# Patient Record
Sex: Male | Born: 1960 | Race: White | Hispanic: No | Marital: Married | Smoking: Former smoker
Health system: Southern US, Community
[De-identification: ages and names within clinical notes are randomized; demographics above are authoritative.]

## PROBLEM LIST (undated history)

## (undated) VITALS — BP 118/76 | HR 89 | Temp 98.5°F | Resp 20 | Ht 74.5 in | Wt 288.0 lb

## (undated) DIAGNOSIS — D696 Thrombocytopenia, unspecified: Secondary | ICD-10-CM

## (undated) DIAGNOSIS — I10 Essential (primary) hypertension: Secondary | ICD-10-CM

## (undated) DIAGNOSIS — K859 Acute pancreatitis without necrosis or infection, unspecified: Secondary | ICD-10-CM

## (undated) DIAGNOSIS — F329 Major depressive disorder, single episode, unspecified: Secondary | ICD-10-CM

## (undated) DIAGNOSIS — B1920 Unspecified viral hepatitis C without hepatic coma: Secondary | ICD-10-CM

## (undated) DIAGNOSIS — K7460 Unspecified cirrhosis of liver: Secondary | ICD-10-CM

## (undated) DIAGNOSIS — Z5189 Encounter for other specified aftercare: Secondary | ICD-10-CM

## (undated) DIAGNOSIS — E669 Obesity, unspecified: Secondary | ICD-10-CM

## (undated) DIAGNOSIS — F319 Bipolar disorder, unspecified: Secondary | ICD-10-CM

## (undated) DIAGNOSIS — IMO0001 Reserved for inherently not codable concepts without codable children: Secondary | ICD-10-CM

## (undated) DIAGNOSIS — F419 Anxiety disorder, unspecified: Secondary | ICD-10-CM

## (undated) DIAGNOSIS — D649 Anemia, unspecified: Secondary | ICD-10-CM

## (undated) HISTORY — PX: TONSILLECTOMY: SUR1361

---

## 2009-02-15 ENCOUNTER — Ambulatory Visit: Payer: Self-pay | Admitting: Diagnostic Radiology

## 2009-02-15 ENCOUNTER — Emergency Department (HOSPITAL_BASED_OUTPATIENT_CLINIC_OR_DEPARTMENT_OTHER): Admission: EM | Admit: 2009-02-15 | Discharge: 2009-02-16 | Payer: Self-pay | Admitting: Emergency Medicine

## 2009-02-16 ENCOUNTER — Ambulatory Visit: Payer: Self-pay | Admitting: Diagnostic Radiology

## 2010-01-20 ENCOUNTER — Inpatient Hospital Stay (HOSPITAL_COMMUNITY): Admission: EM | Admit: 2010-01-20 | Discharge: 2010-01-24 | Payer: Self-pay | Admitting: Emergency Medicine

## 2010-02-07 DIAGNOSIS — I8500 Esophageal varices without bleeding: Secondary | ICD-10-CM | POA: Insufficient documentation

## 2010-02-07 DIAGNOSIS — K8020 Calculus of gallbladder without cholecystitis without obstruction: Secondary | ICD-10-CM | POA: Insufficient documentation

## 2010-02-07 DIAGNOSIS — F1021 Alcohol dependence, in remission: Secondary | ICD-10-CM

## 2010-02-07 DIAGNOSIS — K7460 Unspecified cirrhosis of liver: Secondary | ICD-10-CM | POA: Insufficient documentation

## 2010-02-07 DIAGNOSIS — D62 Acute posthemorrhagic anemia: Secondary | ICD-10-CM | POA: Insufficient documentation

## 2010-02-07 DIAGNOSIS — D689 Coagulation defect, unspecified: Secondary | ICD-10-CM

## 2010-02-07 DIAGNOSIS — B1710 Acute hepatitis C without hepatic coma: Secondary | ICD-10-CM | POA: Insufficient documentation

## 2010-02-07 DIAGNOSIS — D696 Thrombocytopenia, unspecified: Secondary | ICD-10-CM | POA: Insufficient documentation

## 2010-02-07 DIAGNOSIS — M549 Dorsalgia, unspecified: Secondary | ICD-10-CM | POA: Insufficient documentation

## 2010-07-31 LAB — CBC
HCT: 24.5 % — ABNORMAL LOW (ref 39.0–52.0)
HCT: 28.5 % — ABNORMAL LOW (ref 39.0–52.0)
Hemoglobin: 7.4 g/dL — ABNORMAL LOW (ref 13.0–17.0)
Hemoglobin: 8.3 g/dL — ABNORMAL LOW (ref 13.0–17.0)
Hemoglobin: 9.5 g/dL — ABNORMAL LOW (ref 13.0–17.0)
MCH: 31.3 pg (ref 26.0–34.0)
MCHC: 33.4 g/dL (ref 30.0–36.0)
MCHC: 33.6 g/dL (ref 30.0–36.0)
MCHC: 34.2 g/dL (ref 30.0–36.0)
MCHC: 34.2 g/dL (ref 30.0–36.0)
MCHC: 34.3 g/dL (ref 30.0–36.0)
MCV: 90.4 fL (ref 78.0–100.0)
MCV: 91.9 fL (ref 78.0–100.0)
MCV: 92.3 fL (ref 78.0–100.0)
Platelets: 72 10*3/uL — ABNORMAL LOW (ref 150–400)
Platelets: 74 10*3/uL — ABNORMAL LOW (ref 150–400)
RBC: 2.55 MIL/uL — ABNORMAL LOW (ref 4.22–5.81)
RBC: 2.64 MIL/uL — ABNORMAL LOW (ref 4.22–5.81)
RBC: 2.69 MIL/uL — ABNORMAL LOW (ref 4.22–5.81)
RBC: 3.07 MIL/uL — ABNORMAL LOW (ref 4.22–5.81)
RDW: 16.8 % — ABNORMAL HIGH (ref 11.5–15.5)
RDW: 17.7 % — ABNORMAL HIGH (ref 11.5–15.5)
RDW: 17.9 % — ABNORMAL HIGH (ref 11.5–15.5)
RDW: 18.6 % — ABNORMAL HIGH (ref 11.5–15.5)
WBC: 2.7 10*3/uL — ABNORMAL LOW (ref 4.0–10.5)
WBC: 4.1 10*3/uL (ref 4.0–10.5)
WBC: 6.2 10*3/uL (ref 4.0–10.5)

## 2010-07-31 LAB — POTASSIUM: Potassium: 3.9 mEq/L (ref 3.5–5.1)

## 2010-07-31 LAB — COMPREHENSIVE METABOLIC PANEL
ALT: 29 U/L (ref 0–53)
AST: 53 U/L — ABNORMAL HIGH (ref 0–37)
AST: 82 U/L — ABNORMAL HIGH (ref 0–37)
Alkaline Phosphatase: 54 U/L (ref 39–117)
BUN: 28 mg/dL — ABNORMAL HIGH (ref 6–23)
BUN: 8 mg/dL (ref 6–23)
CO2: 20 mEq/L (ref 19–32)
CO2: 21 mEq/L (ref 19–32)
CO2: 23 mEq/L (ref 19–32)
Calcium: 7.2 mg/dL — ABNORMAL LOW (ref 8.4–10.5)
Calcium: 7.6 mg/dL — ABNORMAL LOW (ref 8.4–10.5)
Calcium: 7.7 mg/dL — ABNORMAL LOW (ref 8.4–10.5)
Chloride: 114 mEq/L — ABNORMAL HIGH (ref 96–112)
Chloride: 117 mEq/L — ABNORMAL HIGH (ref 96–112)
Creatinine, Ser: 0.67 mg/dL (ref 0.4–1.5)
Creatinine, Ser: 0.8 mg/dL (ref 0.4–1.5)
GFR calc Af Amer: >60 mL/min (ref >60)
GFR calc Af Amer: >60 mL/min (ref >60)
GFR calc Af Amer: >60 mL/min (ref >60)
GFR calc non Af Amer: >60 mL/min (ref >60)
GFR calc non Af Amer: >60 mL/min (ref >60)
Glucose, Bld: 118 mg/dL — ABNORMAL HIGH (ref 70–99)
Glucose, Bld: 136 mg/dL — ABNORMAL HIGH (ref 70–99)
Glucose, Bld: 97 mg/dL (ref 70–99)
Sodium: 137 mEq/L (ref 135–145)
Sodium: 140 mEq/L (ref 135–145)
Sodium: 144 mEq/L (ref 135–145)
Total Bilirubin: 1.7 mg/dL — ABNORMAL HIGH (ref 0.3–1.2)
Total Protein: 4.8 g/dL — ABNORMAL LOW (ref 6.0–8.3)
Total Protein: 5.2 g/dL — ABNORMAL LOW (ref 6.0–8.3)

## 2010-07-31 LAB — TYPE AND SCREEN: ABO/RH(D): O POS

## 2010-07-31 LAB — URINALYSIS, ROUTINE W REFLEX MICROSCOPIC
Glucose, UA: NEGATIVE mg/dL
Specific Gravity, Urine: 1.025 (ref 1.005–1.030)
Urobilinogen, UA: 1 mg/dL (ref 0.0–1.0)
pH: 7 (ref 5.0–8.0)

## 2010-07-31 LAB — PHOSPHORUS: Phosphorus: 3.4 mg/dL (ref 2.3–4.6)

## 2010-07-31 LAB — BASIC METABOLIC PANEL
Chloride: 117 mEq/L — ABNORMAL HIGH (ref 96–112)
GFR calc Af Amer: >60 mL/min (ref >60)
Potassium: 3.6 mEq/L (ref 3.5–5.1)

## 2010-07-31 LAB — DIFFERENTIAL
Basophils Relative: 3 % — ABNORMAL HIGH (ref 0–1)
Eosinophils Absolute: 0 10*3/uL (ref 0.0–0.7)
Eosinophils Relative: 0 % (ref 0–5)
Lymphs Abs: 0.6 10*3/uL — ABNORMAL LOW (ref 0.7–4.0)
Monocytes Relative: 9 % (ref 3–12)

## 2010-07-31 LAB — PREPARE RBC (CROSSMATCH)

## 2010-07-31 LAB — CARDIAC PANEL(CRET KIN+CKTOT+MB+TROPI)
CK, MB: 2.5 ng/mL (ref 0.3–4.0)
Relative Index: 1 (ref 0.0–2.5)
Total CK: 245 U/L — ABNORMAL HIGH (ref 7–232)
Total CK: 253 U/L — ABNORMAL HIGH (ref 7–232)
Troponin I: 0.01 ng/mL (ref 0.00–0.06)
Troponin I: 0.01 ng/mL (ref 0.00–0.06)

## 2010-07-31 LAB — HEPATITIS PANEL, ACUTE
HCV Ab: REACTIVE — AB
Hep A IgM: NEGATIVE
Hepatitis B Surface Ag: NEGATIVE

## 2010-07-31 LAB — CULTURE, BLOOD (SINGLE)
Culture: NO GROWTH
Culture: NO GROWTH

## 2010-07-31 LAB — HEMOGLOBIN AND HEMATOCRIT, BLOOD
HCT: 27.9 % — ABNORMAL LOW (ref 39.0–52.0)
HCT: 28.5 % — ABNORMAL LOW (ref 39.0–52.0)
Hemoglobin: 9.7 g/dL — ABNORMAL LOW (ref 13.0–17.0)

## 2010-07-31 LAB — PROTIME-INR
INR: 1.5 — ABNORMAL HIGH (ref 0.00–1.49)
Prothrombin Time: 18.3 seconds — ABNORMAL HIGH (ref 11.6–15.2)

## 2010-07-31 LAB — MAGNESIUM: Magnesium: 2 mg/dL (ref 1.5–2.5)

## 2010-07-31 LAB — ABO/RH: ABO/RH(D): O POS

## 2010-07-31 LAB — HEPATITIS C VRS RNA DETECT BY PCR-QUAL: Hepatitis C Vrs RNA by PCR-Qual: POSITIVE — AB

## 2010-07-31 LAB — HEMOCCULT GUIAC POC 1CARD (OFFICE): Fecal Occult Bld: POSITIVE

## 2010-07-31 LAB — APTT: aPTT: 26 seconds (ref 24–37)

## 2010-08-21 LAB — POCT TOXICOLOGY PANEL

## 2010-08-21 LAB — DIFFERENTIAL
Basophils Absolute: 0 10*3/uL (ref 0.0–0.1)
Basophils Relative: 1 % (ref 0–1)
Eosinophils Absolute: 0 10*3/uL (ref 0.0–0.7)
Eosinophils Relative: 0 % (ref 0–5)
Lymphs Abs: 1 10*3/uL (ref 0.7–4.0)
Neutrophils Relative %: 52 % (ref 43–77)

## 2010-08-21 LAB — LIPASE, BLOOD: Lipase: 171 U/L (ref 23–300)

## 2010-08-21 LAB — URINALYSIS, ROUTINE W REFLEX MICROSCOPIC
Leukocytes, UA: NEGATIVE
Protein, ur: NEGATIVE mg/dL
Urobilinogen, UA: 0.2 mg/dL (ref 0.0–1.0)

## 2010-08-21 LAB — COMPREHENSIVE METABOLIC PANEL
ALT: 101 U/L — ABNORMAL HIGH (ref 0–53)
CO2: 30 mEq/L (ref 19–32)
Calcium: 8.9 mg/dL (ref 8.4–10.5)
Chloride: 111 mEq/L (ref 96–112)
GFR calc non Af Amer: >60 mL/min (ref >60)
Glucose, Bld: 128 mg/dL — ABNORMAL HIGH (ref 70–99)
Sodium: 151 mEq/L — ABNORMAL HIGH (ref 135–145)
Total Bilirubin: 1.5 mg/dL — ABNORMAL HIGH (ref 0.3–1.2)

## 2010-08-21 LAB — CBC
Hemoglobin: 15.2 g/dL (ref 13.0–17.0)
MCHC: 34.1 g/dL (ref 30.0–36.0)
MCV: 95.6 fL (ref 78.0–100.0)
RBC: 4.68 MIL/uL (ref 4.22–5.81)
WBC: 2.9 10*3/uL — ABNORMAL LOW (ref 4.0–10.5)

## 2010-08-21 LAB — URINE MICROSCOPIC-ADD ON

## 2010-08-21 LAB — SALICYLATE LEVEL: Salicylate Lvl: <1 mg/dL — ABNORMAL LOW (ref 2.8–20.0)

## 2011-06-24 ENCOUNTER — Telehealth: Payer: Self-pay | Admitting: Family Medicine

## 2011-06-24 ENCOUNTER — Ambulatory Visit (INDEPENDENT_AMBULATORY_CARE_PROVIDER_SITE_OTHER): Payer: Self-pay | Admitting: Family Medicine

## 2011-06-24 VITALS — BP 138/85 | HR 101 | Temp 99.0°F | Resp 18 | Ht 76.25 in | Wt 287.0 lb

## 2011-06-24 DIAGNOSIS — Z765 Malingerer [conscious simulation]: Secondary | ICD-10-CM

## 2011-06-24 DIAGNOSIS — F1910 Other psychoactive substance abuse, uncomplicated: Secondary | ICD-10-CM

## 2011-06-24 NOTE — Progress Notes (Signed)
  Subjective:    Patient ID: [name] [name], male    DOB: 02-Jun-1960, 51 y.o.   MRN: [id number]  HPI 51 yo male with h/o hep C cirrhosis and alcohol abuse as well as anxiety who would like to resume anxiety meds.  Currently unemployed.  Was taking xanax 1 mg TID prn.  Tried valium.  Made too sleepy.  Never on a SSRI.   Recently having increase in panic attacks.  Left [employer] with cart full due to overwhelming anxiety.  Helps care for mother-in-law.    Wants back on xanax.  Checked DEA website - given #90 of xanax 1mg  by another local doctor and filled on 12/28 and 1/28.  Asked patient about this.  He confirmed it was true.  Confirmed he still had some at home.  Told patient he cannot have more.  Pt okay.  Review of Systems     Objective:   Physical Exam   Deferred - patient no longer wanted visit when clear no xanax would be prescribed     Assessment & Plan:  Drug seeking behavior

## 2011-06-29 NOTE — Telephone Encounter (Signed)
done

## 2011-07-08 ENCOUNTER — Telehealth: Payer: Self-pay

## 2011-10-30 ENCOUNTER — Emergency Department (HOSPITAL_BASED_OUTPATIENT_CLINIC_OR_DEPARTMENT_OTHER)
Admission: EM | Admit: 2011-10-30 | Discharge: 2011-10-30 | Disposition: A | Discharge status: Home / Self Care | Payer: Self-pay | Attending: Emergency Medicine | Admitting: Emergency Medicine

## 2011-10-30 ENCOUNTER — Encounter (HOSPITAL_BASED_OUTPATIENT_CLINIC_OR_DEPARTMENT_OTHER): Payer: Self-pay | Admitting: *Deleted

## 2011-10-30 DIAGNOSIS — F411 Generalized anxiety disorder: Secondary | ICD-10-CM | POA: Insufficient documentation

## 2011-10-30 DIAGNOSIS — K922 Gastrointestinal hemorrhage, unspecified: Secondary | ICD-10-CM | POA: Insufficient documentation

## 2011-10-30 DIAGNOSIS — I10 Essential (primary) hypertension: Secondary | ICD-10-CM | POA: Insufficient documentation

## 2011-10-30 DIAGNOSIS — B182 Chronic viral hepatitis C: Secondary | ICD-10-CM | POA: Insufficient documentation

## 2011-10-30 DIAGNOSIS — K7460 Unspecified cirrhosis of liver: Secondary | ICD-10-CM | POA: Insufficient documentation

## 2011-10-30 DIAGNOSIS — Z87891 Personal history of nicotine dependence: Secondary | ICD-10-CM | POA: Insufficient documentation

## 2011-10-30 DIAGNOSIS — Z79899 Other long term (current) drug therapy: Secondary | ICD-10-CM | POA: Insufficient documentation

## 2011-10-30 DIAGNOSIS — E860 Dehydration: Secondary | ICD-10-CM | POA: Insufficient documentation

## 2011-10-30 DIAGNOSIS — D649 Anemia, unspecified: Secondary | ICD-10-CM | POA: Insufficient documentation

## 2011-10-30 HISTORY — DX: Essential (primary) hypertension: I10

## 2011-10-30 HISTORY — DX: Unspecified viral hepatitis C without hepatic coma: B19.20

## 2011-10-30 HISTORY — DX: Thrombocytopenia, unspecified: D69.6

## 2011-10-30 HISTORY — DX: Encounter for other specified aftercare: Z51.89

## 2011-10-30 HISTORY — DX: Anemia, unspecified: D64.9

## 2011-10-30 HISTORY — DX: Reserved for inherently not codable concepts without codable children: IMO0001

## 2011-10-30 HISTORY — DX: Anxiety disorder, unspecified: F41.9

## 2011-10-30 HISTORY — DX: Unspecified cirrhosis of liver: K74.60

## 2011-10-30 LAB — PROTIME-INR
INR: 1.29 (ref 0.00–1.49)
Prothrombin Time: 16.3 seconds — ABNORMAL HIGH (ref 11.6–15.2)

## 2011-10-30 LAB — COMPREHENSIVE METABOLIC PANEL
ALT: 53 U/L (ref 0–53)
BUN: 29 mg/dL — ABNORMAL HIGH (ref 6–23)
CO2: 22 mEq/L (ref 19–32)
Calcium: 9.7 mg/dL (ref 8.4–10.5)
Creatinine, Ser: 0.5 mg/dL (ref 0.50–1.35)
GFR calc Af Amer: >90 mL/min (ref >90)
GFR calc non Af Amer: >90 mL/min (ref >90)
Glucose, Bld: 145 mg/dL — ABNORMAL HIGH (ref 70–99)
Sodium: 136 mEq/L (ref 135–145)
Total Protein: 7.4 g/dL (ref 6.0–8.3)

## 2011-10-30 LAB — CBC
HCT: 39.5 % (ref 39.0–52.0)
MCH: 32 pg (ref 26.0–34.0)
MCHC: 36.9 g/dL — ABNORMAL HIGH (ref 30.0–36.0)
MCV: 86.1 fL (ref 78.0–100.0)
Platelets: 139 10*3/uL — ABNORMAL LOW (ref 150–400)
Platelets: 114 10*3/uL — ABNORMAL LOW (ref 150–400)
RBC: 4.59 MIL/uL (ref 4.22–5.81)
RDW: 13.9 % (ref 11.5–15.5)
WBC: 7.7 10*3/uL (ref 4.0–10.5)

## 2011-10-30 LAB — DIFFERENTIAL
Eosinophils Absolute: 0.2 10*3/uL (ref 0.0–0.7)
Eosinophils Relative: 2 % (ref 0–5)
Lymphs Abs: 1.4 10*3/uL (ref 0.7–4.0)
Monocytes Absolute: 0.8 10*3/uL (ref 0.1–1.0)

## 2011-10-30 LAB — OCCULT BLOOD X 1 CARD TO LAB, STOOL: Fecal Occult Bld: POSITIVE

## 2011-10-30 MED ORDER — SODIUM CHLORIDE 0.9 % IV BOLUS (SEPSIS)
1000.0000 mL | Freq: Once | INTRAVENOUS | Status: AC
  Administered 2011-10-30: 1000 mL via INTRAVENOUS

## 2011-10-30 MED ORDER — SODIUM CHLORIDE 0.9 % IV SOLN
80.0000 mg | Freq: Once | INTRAVENOUS | Status: AC
  Administered 2011-10-30: 80 mg via INTRAVENOUS
  Filled 2011-10-30: qty 80

## 2011-10-30 MED ORDER — ALPRAZOLAM 0.5 MG PO TABS
1.0000 mg | ORAL_TABLET | Freq: Once | ORAL | Status: AC
  Administered 2011-10-30: 0.5 mg via ORAL
  Filled 2011-10-30: qty 1

## 2011-10-30 MED ORDER — SODIUM CHLORIDE 0.9 % IV SOLN
8.0000 mg/h | INTRAVENOUS | Status: DC
  Filled 2011-10-30: qty 80

## 2011-10-30 NOTE — Discharge Instructions (Signed)
Gastrointestinal Bleeding Gastrointestinal (GI) bleeding is bleeding from the gut or any place between your mouth and anus. If bleeding is slow, you may be allowed to go home. If there is a lot of bleeding, hospitalization and observation are often required. SYMPTOMS   You vomit bright red blood or material that looks like coffee grounds.   You have blood in your stools or the stools look black and tarry.  DIAGNOSIS  Your caregiver may diagnose your condition by taking a history and a physical exam. More tests may be needed, including:  X-rays.   EGD (esophagogastroduodenoscopy), which looks at your esophagus, stomach, and small bowel through a flexible telescope-like instrument.   Colonoscopy, which looks at your colon/large bowel through a flexible telescope-like instrument.   Biopsies, which remove a small sample of tissue to examine under a microscope.  Finding out the results of your test Not all test results are available during your visit. If your test results are not back during the visit, make an appointment with your caregiver to find out the results. Do not assume everything is normal if you have not heard from your caregiver or the medical facility. It is important for you to follow up on all of your test results. HOME CARE INSTRUCTIONS   Follow instructions as suggested by your caregiver regarding medicines. Do not take aspirin, drink alcohol, or take medicines for pain and arthritis unless your caregiver says it is okay.   Get the suggested follow-up care when the tests are done.  SEEK IMMEDIATE MEDICAL CARE IF:   Your bleeding increases or you become lightheaded, weak, or pass out (faint).   You experience severe cramps in your stomach, back, or belly (abdomen).   You pass large clots.   The problems which brought you in for medical care get worse.  MAKE SURE YOU:   Understand these instructions.   Will watch your condition.   Will get help right away if you are  not doing well or get worse.  Document Released: 05/01/2000 Document Revised: 04/23/2011 Document Reviewed: 04/13/2011 [facility] Patient Information 2012 [place], [place].Dehydration, Adult Dehydration is when you lose more fluids from the body than you take in. Vital organs like the kidneys, brain, and heart cannot function without a proper amount of fluids and salt. Any loss of fluids from the body can cause dehydration.  CAUSES   Vomiting.   Diarrhea.   Excessive sweating.   Excessive urine output.   Fever.  SYMPTOMS  Mild dehydration  Thirst.   Dry lips.   Slightly dry mouth.  Moderate dehydration  Very dry mouth.   Sunken eyes.   Skin does not bounce back quickly when lightly pinched and released.   Dark urine and decreased urine production.   Decreased tear production.   Headache.  Severe dehydration  Very dry mouth.   Extreme thirst.   Rapid, weak pulse (more than 100 beats per minute at rest).   Cold hands and feet.   Not able to sweat in spite of heat and temperature.   Rapid breathing.   Blue lips.   Confusion and lethargy.   Difficulty being awakened.   Minimal urine production.   No tears.  DIAGNOSIS  Your caregiver will diagnose dehydration based on your symptoms and your exam. Blood and urine tests will help confirm the diagnosis. The diagnostic evaluation should also identify the cause of dehydration. TREATMENT  Treatment of mild or moderate dehydration can often be done at home by increasing the amount  of fluids that you drink. It is best to drink small amounts of fluid more often. Drinking too much at one time can make vomiting worse. Refer to the home care instructions below. Severe dehydration needs to be treated at the hospital where you will probably be given intravenous (IV) fluids that contain water and electrolytes. HOME CARE INSTRUCTIONS   Ask your caregiver about specific rehydration instructions.   Drink enough fluids to  keep your urine clear or pale yellow.   Drink small amounts frequently if you have nausea and vomiting.   Eat as you normally do.   Avoid:   Foods or drinks high in sugar.   Carbonated drinks.   Juice.   Extremely hot or cold fluids.   Drinks with caffeine.   Fatty, greasy foods.   Alcohol.   Tobacco.   Overeating.   Gelatin desserts.   Wash your hands well to avoid spreading bacteria and viruses.   Only take over-the-counter or prescription medicines for pain, discomfort, or fever as directed by your caregiver.   Ask your caregiver if you should continue all prescribed and over-the-counter medicines.   Keep all follow-up appointments with your caregiver.  SEEK MEDICAL CARE IF:  You have abdominal pain and it increases or stays in one area (localizes).   You have a rash, stiff neck, or severe headache.   You are irritable, sleepy, or difficult to awaken.   You are weak, dizzy, or extremely thirsty.  SEEK IMMEDIATE MEDICAL CARE IF:   You are unable to keep fluids down or you get worse despite treatment.   You have frequent episodes of vomiting or diarrhea.   You have blood or green matter (bile) in your vomit.   You have blood in your stool or your stool looks black and tarry.   You have not urinated in 6 to 8 hours, or you have only urinated a small amount of very dark urine.   You have a fever.   You faint.  MAKE SURE YOU:   Understand these instructions.   Will watch your condition.   Will get help right away if you are not doing well or get worse.  Document Released: 05/04/2005 Document Revised: 04/23/2011 Document Reviewed: 12/22/2010 [facility] Patient Information 2012 [place], [place].Dehydration, Adult Dehydration is when you lose more fluids from the body than you take in. Vital organs like the kidneys, brain, and heart cannot function without a proper amount of fluids and salt. Any loss of fluids from the body can cause dehydration.  CAUSES     Vomiting.   Diarrhea.   Excessive sweating.   Excessive urine output.   Fever.  SYMPTOMS  Mild dehydration  Thirst.   Dry lips.   Slightly dry mouth.  Moderate dehydration  Very dry mouth.   Sunken eyes.   Skin does not bounce back quickly when lightly pinched and released.   Dark urine and decreased urine production.   Decreased tear production.   Headache.  Severe dehydration  Very dry mouth.   Extreme thirst.   Rapid, weak pulse (more than 100 beats per minute at rest).   Cold hands and feet.   Not able to sweat in spite of heat and temperature.   Rapid breathing.   Blue lips.   Confusion and lethargy.   Difficulty being awakened.   Minimal urine production.   No tears.  DIAGNOSIS  Your caregiver will diagnose dehydration based on your symptoms and your exam. Blood and urine tests will help  confirm the diagnosis. The diagnostic evaluation should also identify the cause of dehydration. TREATMENT  Treatment of mild or moderate dehydration can often be done at home by increasing the amount of fluids that you drink. It is best to drink small amounts of fluid more often. Drinking too much at one time can make vomiting worse. Refer to the home care instructions below. Severe dehydration needs to be treated at the hospital where you will probably be given intravenous (IV) fluids that contain water and electrolytes. HOME CARE INSTRUCTIONS   Ask your caregiver about specific rehydration instructions.   Drink enough fluids to keep your urine clear or pale yellow.   Drink small amounts frequently if you have nausea and vomiting.   Eat as you normally do.   Avoid:   Foods or drinks high in sugar.   Carbonated drinks.   Juice.   Extremely hot or cold fluids.   Drinks with caffeine.   Fatty, greasy foods.   Alcohol.   Tobacco.   Overeating.   Gelatin desserts.   Wash your hands well to avoid spreading bacteria and viruses.   Only  take over-the-counter or prescription medicines for pain, discomfort, or fever as directed by your caregiver.   Ask your caregiver if you should continue all prescribed and over-the-counter medicines.   Keep all follow-up appointments with your caregiver.  SEEK MEDICAL CARE IF:  You have abdominal pain and it increases or stays in one area (localizes).   You have a rash, stiff neck, or severe headache.   You are irritable, sleepy, or difficult to awaken.   You are weak, dizzy, or extremely thirsty.  SEEK IMMEDIATE MEDICAL CARE IF:   You are unable to keep fluids down or you get worse despite treatment.   You have frequent episodes of vomiting or diarrhea.   You have blood or green matter (bile) in your vomit.   You have blood in your stool or your stool looks black and tarry.   You have not urinated in 6 to 8 hours, or you have only urinated a small amount of very dark urine.   You have a fever.   You faint.  MAKE SURE YOU:   Understand these instructions.   Will watch your condition.   Will get help right away if you are not doing well or get worse.  Document Released: 05/04/2005 Document Revised: 04/23/2011 Document Reviewed: 12/22/2010 [facility] Patient Information 2012 [place], [place].

## 2011-10-30 NOTE — ED Notes (Signed)
Upon standing pt's heart rate up into the 130's, MD made aware, new orders rec'd.

## 2011-10-30 NOTE — ED Notes (Signed)
ST on monitor, IV fluids infusing without diff, IV site unremarkable. Pt denies any pain at this time.

## 2011-10-30 NOTE — ED Provider Notes (Addendum)
History     CSN: [id number]  Arrival date & time 10/30/11  0358   First MD Initiated Contact with Patient 10/30/11 0406      Chief Complaint  Patient presents with  . Rectal Bleeding    (Consider location/radiation/quality/duration/timing/severity/associated sxs/prior treatment) HPI Patient is a 51 year old male with a history of gastrointestinal bleeding as well as cirrhosis and hepatitis C who presents today one hour following a large dark tarry bowel movement. Patient denies any abdominal pain at this time. He does report that he has had 3 episodes since yesterday of "spitting up" mouth full of blood. Patient has a history of esophageal varices as well. He is not currently followed up by a gastroenterologist. At prior hospitalizations patient has required transfusion with as much his 4 units of blood. He denies any fevers as well as any nausea at this time. Patient is tachycardic to 120 upon arrival without hypotension. Patient is diaphoretic but denies chest pain, shortness of breath, or lightheadedness. He reports that he has not used alcohol for some time. There are no other associated or modifying factors. Past Medical History  Diagnosis Date  . Thrombocytopenia   . Cirrhosis   . Hepatitis C   . Hypertension   . Anxiety   . Anemia   . Blood transfusion     History reviewed. No pertinent past surgical history.  History reviewed. No pertinent family history.  History  Substance Use Topics  . Smoking status: Former Smoker    Types: Cigarettes    Quit date: 11/05/2010  . Smokeless tobacco: Not on file  . Alcohol Use: No      Review of Systems  Constitutional: Positive for diaphoresis.  HENT: Negative.   Eyes: Negative.   Respiratory: Negative.   Cardiovascular: Negative.   Gastrointestinal: Positive for vomiting and blood in stool.       Hematemesis  Genitourinary: Negative.   Musculoskeletal: Negative.   Skin: Negative.   Neurological: Negative.     Hematological: Negative.   Psychiatric/Behavioral: Negative.   All other systems reviewed and are negative.    Allergies  Review of patient's allergies indicates no known allergies.  Home Medications   Current Outpatient Rx  Name Route Sig Dispense Refill  . FLUOXETINE HCL 20 MG PO CAPS Oral Take 20 mg by mouth daily.    Marland Kitchen ALPRAZOLAM ER 1 MG PO TB24 Oral Take 1 mg by mouth 3 (three) times daily as needed.    Marland Kitchen LISINOPRIL-HYDROCHLOROTHIAZIDE 20-12.5 MG PO TABS Oral Take 1 tablet by mouth daily.      BP 146/91  Pulse 119  Temp 98.3 F (36.8 C) (Oral)  Resp 18  Ht 6\' 4"  (1.93 m)  Wt 272 lb (123.378 kg)  BMI 33.11 kg/m2  SpO2 98%  Physical Exam  Nursing note and vitals reviewed. GEN: Well-developed, well-nourished male in no distress HEENT: Atraumatic, normocephalic.  EYES: PERRLA BL, no scleral icterus. NECK: Trachea midline, no meningismus CV: Tachycardic with regular rhythm. No murmurs, rubs, or gallops PULM: No respiratory distress.  No crackles, wheezes, or rales. GI: soft, non-tender. No guarding, rebound, or tenderness. + bowel sounds. Rectal exam with black stool and normal rectal tone.  GU: deferred Neuro: cranial nerves grossly 2-12 intact, no abnormalities of strength or sensation, A and O x 3 MSK: Patient moves all 4 extremities symmetrically, no deformity, edema, or injury noted Skin: No rashes petechiae, purpura, or jaundice. Diaphoretic. Psych: no abnormality of mood   ED Course  Procedures (including critical  care time)   Indication: tachycardia  Please note this EKG was reviewed extemporaneously by myself.  Date: 10/30/2011  Rate: 100 and  Rhythm: normal sinus rhythm  QRS Axis: normal  Intervals: normal  ST/T Wave abnormalities: normal  Conduction Disutrbances: none  Narrative Interpretation: unremarkable Prior EKG available: None available                                    Labs Reviewed  CBC - Abnormal; Notable for the following:    MCHC  37.2 (*)  RULED OUT INTERFERING SUBSTANCES   Platelets 139 (*)     All other components within normal limits  COMPREHENSIVE METABOLIC PANEL - Abnormal; Notable for the following:    Glucose, Bld 145 (*)     BUN 29 (*)     AST 68 (*)     All other components within normal limits  PROTIME-INR - Abnormal; Notable for the following:    Prothrombin Time 16.3 (*)     All other components within normal limits  CBC - Abnormal; Notable for the following:    RBC 4.12 (*)     HCT 35.8 (*)     MCHC 36.9 (*)     All other components within normal limits  DIFFERENTIAL  OCCULT BLOOD X 1 CARD TO LAB, STOOL   No results found.   1. GI bleeding   2. Dehydration       MDM  Patient was evaluated by myself. Given history of GI bleeding as well as report of hematemesis and dark black stools I was concerned for anemia from acute blood loss. Patient has risk factors for coagulopathy given his cirrhosis. Patient has not followed on a regular basis for this. Laboratory workup for GI bleeding was performed. No type and screen was ordered as patient was seen at [facility]. Given tachycardia 1 L of normal saline IV bolus was ordered. Patient denied any abdominal pain. He was not hypotensive. Patient was not currently nauseous or vomiting. Protonix bolus as well as drip was ordered as well. I am awaiting return of patient's labs at this time. No tachycardic patient is otherwise hemodynamically stable.  6:19 AM Patient had hemoglobin of 14.7 today. Given this infusion of protonix was held and 1 L of normal saline IV bolus was completed given dehydration indicated by elevated BUN/creatinine ratio. The patient had resting heart rate that improved to the low 100s heart rate jumped to 130 upon standing. A second liter of IV fluid has been ordered. Patient has had no new bleeding since presentation. He has not had any hematemesis. He did have Hemoccult-positive stool that was dark black on rectal exam but has  not had another bowel movement.  Repeat CBC was ordered as we are now 3 hours from patient's reported dark bowel movement and he still tachycardic. Patient is not hypotensive. Patient continues to deny chest pain or shortness of breath but EKG was performed. This is unremarkable. Patient will be reassessed following second liter of IV fluid and upon repeat CBC. If he has significant change in his CBC admission will likely be warranted. If this is essentially unchanged and he is no longer orthostatic after his second liter possible discharge with GI followup is likely.  7:24 AM Repeat CBC showed drop to only 13.7 after 1 L of fluids and 3 hours since last bowel movement containing blood. Patient no longer felt  lightheaded upon standing though his heart rate did still increase. He was given a dose of his 1 mg of Xanax by mouth that he normally takes in the morning and a third liter of normal saline is hung. Patient will be reassessed following this. Anticipate that he'll be able to be discharged home. He has been referred back to Dr. Dulce Sellar. No call was placed to gastroenterology as patient admits that he likely will not follow up at this time but does understand the importance of followup. Patient was advised that if he has more dark tarry stools, hematemesis, or symptoms of anemia as we discussed he should return to an emergency department immediately. Patient stated clear understanding. Patient was feeling better after Xanax and third liter of IV fluids. He was felt safe for discharge. Patient is discharged in good condition.        Cyndra Numbers, MD 10/30/11 [id number]  Cyndra Numbers, MD 10/30/11 [phone]

## 2011-10-30 NOTE — ED Notes (Signed)
C/o dark blood from rectum since this AM

## 2012-04-01 ENCOUNTER — Emergency Department (HOSPITAL_BASED_OUTPATIENT_CLINIC_OR_DEPARTMENT_OTHER): Payer: Medicare Other

## 2012-04-01 ENCOUNTER — Encounter (HOSPITAL_BASED_OUTPATIENT_CLINIC_OR_DEPARTMENT_OTHER): Payer: Self-pay | Admitting: *Deleted

## 2012-04-01 ENCOUNTER — Inpatient Hospital Stay (HOSPITAL_BASED_OUTPATIENT_CLINIC_OR_DEPARTMENT_OTHER)
Admission: EM | Admit: 2012-04-01 | Discharge: 2012-04-03 | DRG: 442 | Disposition: A | Discharge status: Home / Self Care | Payer: Medicare Other | Attending: Internal Medicine | Admitting: Internal Medicine

## 2012-04-01 DIAGNOSIS — G8929 Other chronic pain: Secondary | ICD-10-CM | POA: Diagnosis present

## 2012-04-01 DIAGNOSIS — K7290 Hepatic failure, unspecified without coma: Secondary | ICD-10-CM | POA: Diagnosis present

## 2012-04-01 DIAGNOSIS — M549 Dorsalgia, unspecified: Secondary | ICD-10-CM | POA: Diagnosis present

## 2012-04-01 DIAGNOSIS — Y92009 Unspecified place in unspecified non-institutional (private) residence as the place of occurrence of the external cause: Secondary | ICD-10-CM

## 2012-04-01 DIAGNOSIS — D696 Thrombocytopenia, unspecified: Secondary | ICD-10-CM

## 2012-04-01 DIAGNOSIS — D689 Coagulation defect, unspecified: Secondary | ICD-10-CM | POA: Diagnosis present

## 2012-04-01 DIAGNOSIS — T40605A Adverse effect of unspecified narcotics, initial encounter: Secondary | ICD-10-CM | POA: Diagnosis present

## 2012-04-01 DIAGNOSIS — Z9181 History of falling: Secondary | ICD-10-CM

## 2012-04-01 DIAGNOSIS — I10 Essential (primary) hypertension: Secondary | ICD-10-CM | POA: Diagnosis present

## 2012-04-01 DIAGNOSIS — D684 Acquired coagulation factor deficiency: Secondary | ICD-10-CM | POA: Diagnosis present

## 2012-04-01 DIAGNOSIS — B1921 Unspecified viral hepatitis C with hepatic coma: Principal | ICD-10-CM | POA: Diagnosis present

## 2012-04-01 DIAGNOSIS — Z8249 Family history of ischemic heart disease and other diseases of the circulatory system: Secondary | ICD-10-CM

## 2012-04-01 DIAGNOSIS — T424X5A Adverse effect of benzodiazepines, initial encounter: Secondary | ICD-10-CM | POA: Diagnosis present

## 2012-04-01 DIAGNOSIS — Z79899 Other long term (current) drug therapy: Secondary | ICD-10-CM

## 2012-04-01 DIAGNOSIS — K7460 Unspecified cirrhosis of liver: Secondary | ICD-10-CM | POA: Diagnosis present

## 2012-04-01 DIAGNOSIS — B1710 Acute hepatitis C without hepatic coma: Secondary | ICD-10-CM | POA: Diagnosis present

## 2012-04-01 DIAGNOSIS — D61818 Other pancytopenia: Secondary | ICD-10-CM | POA: Diagnosis present

## 2012-04-01 DIAGNOSIS — Z6836 Body mass index (BMI) 36.0-36.9, adult: Secondary | ICD-10-CM

## 2012-04-01 DIAGNOSIS — T481X5A Adverse effect of skeletal muscle relaxants [neuromuscular blocking agents], initial encounter: Secondary | ICD-10-CM | POA: Diagnosis present

## 2012-04-01 DIAGNOSIS — F411 Generalized anxiety disorder: Secondary | ICD-10-CM | POA: Diagnosis present

## 2012-04-01 DIAGNOSIS — Z87891 Personal history of nicotine dependence: Secondary | ICD-10-CM

## 2012-04-01 DIAGNOSIS — Z9089 Acquired absence of other organs: Secondary | ICD-10-CM

## 2012-04-01 HISTORY — DX: Obesity, unspecified: E66.9

## 2012-04-01 LAB — URINALYSIS, ROUTINE W REFLEX MICROSCOPIC
Glucose, UA: NEGATIVE mg/dL
Hgb urine dipstick: NEGATIVE
Protein, ur: NEGATIVE mg/dL
Specific Gravity, Urine: 1.03 (ref 1.005–1.030)
pH: 6 (ref 5.0–8.0)

## 2012-04-01 LAB — COMPREHENSIVE METABOLIC PANEL
ALT: 43 U/L (ref 0–53)
Albumin: 3.3 g/dL — ABNORMAL LOW (ref 3.5–5.2)
Alkaline Phosphatase: 68 U/L (ref 39–117)
BUN: 10 mg/dL (ref 6–23)
Chloride: 102 mEq/L (ref 96–112)
Glucose, Bld: 112 mg/dL — ABNORMAL HIGH (ref 70–99)
Potassium: 3.8 mEq/L (ref 3.5–5.1)
Sodium: 138 mEq/L (ref 135–145)
Total Bilirubin: 0.8 mg/dL (ref 0.3–1.2)
Total Protein: 7 g/dL (ref 6.0–8.3)

## 2012-04-01 LAB — CBC WITH DIFFERENTIAL/PLATELET
Basophils Relative: 1 % (ref 0–1)
Eosinophils Absolute: 0.3 10*3/uL (ref 0.0–0.7)
Hemoglobin: 10.8 g/dL — ABNORMAL LOW (ref 13.0–17.0)
Lymphs Abs: 0.5 10*3/uL — ABNORMAL LOW (ref 0.7–4.0)
MCH: 25.5 pg — ABNORMAL LOW (ref 26.0–34.0)
Monocytes Relative: 21 % — ABNORMAL HIGH (ref 3–12)
Neutro Abs: 1.5 10*3/uL — ABNORMAL LOW (ref 1.7–7.7)
Neutrophils Relative %: 51 % (ref 43–77)
Platelets: 73 10*3/uL — ABNORMAL LOW (ref 150–400)
RBC: 4.23 MIL/uL (ref 4.22–5.81)
WBC: 2.8 10*3/uL — ABNORMAL LOW (ref 4.0–10.5)

## 2012-04-01 LAB — URINE MICROSCOPIC-ADD ON

## 2012-04-01 LAB — AMMONIA: Ammonia: 81 umol/L — ABNORMAL HIGH (ref 11–60)

## 2012-04-01 LAB — RAPID URINE DRUG SCREEN, HOSP PERFORMED
Amphetamines: NOT DETECTED
Opiates: POSITIVE — AB

## 2012-04-01 MED ORDER — SODIUM CHLORIDE 0.9 % IV BOLUS (SEPSIS)
1000.0000 mL | Freq: Once | INTRAVENOUS | Status: AC
  Administered 2012-04-01: 1000 mL via INTRAVENOUS

## 2012-04-01 MED ORDER — MORPHINE SULFATE 4 MG/ML IJ SOLN
2.0000 mg | Freq: Once | INTRAMUSCULAR | Status: AC
  Administered 2012-04-01: 2 mg via INTRAVENOUS
  Filled 2012-04-01: qty 1

## 2012-04-01 NOTE — ED Notes (Signed)
Patient transported to CT 

## 2012-04-01 NOTE — ED Notes (Signed)
Attempted blood collection from right hand unsuccessful.  New order received from Dr Rosalia Hammers for arterial stick to obtain specimen.

## 2012-04-01 NOTE — ED Provider Notes (Signed)
History     CSN: [id number]  Arrival date & time 04/01/12  1612   First MD Initiated Contact with Patient 04/01/12 1629      Chief Complaint  Patient presents with  . Back Pain    (Consider location/radiation/quality/duration/timing/severity/associated sxs/prior treatment) HPI Comments: Patient is a 51 year old male with a past medical history of thrombocytopenia, cirrhosis, hepatitis C, and hypertension who presents with left side back pain that started 5 days ago.The pain is located on the left side of his lower back and does radiate around to left hip. The pain is described as throbbing and severe. The pain started suddenly when he bent over 5 days ago and progressively worsened since the onset. No alleviating factors. Movement affecting his low back aggravates his pain. The patient has tried oxycodone and Flexeril for symptoms without relief. Associated symptoms include confusion, slurred speech, and lack of coordination (per patient's wife who is at the bedside). The wife reports a slow decline in the patient's overall attentiveness and independence the past few days.  Patient denies fever, head trauma, visual changes, headache, NVD, chest pain, SOB, dysuria, constipation, abdominal pain.    Past Medical History  Diagnosis Date  . Thrombocytopenia   . Cirrhosis   . Hepatitis C   . Hypertension   . Anxiety   . Anemia   . Blood transfusion     History reviewed. No pertinent past surgical history.  History reviewed. No pertinent family history.  History  Substance Use Topics  . Smoking status: Former Smoker    Types: Cigarettes    Quit date: 11/05/2010  . Smokeless tobacco: Not on file  . Alcohol Use: No      Review of Systems  Musculoskeletal: Positive for back pain and arthralgias.  Neurological: Positive for speech difficulty.  All other systems reviewed and are negative.    Allergies  Review of patient's allergies indicates no known allergies.  Home  Medications   Current Outpatient Rx  Name  Route  Sig  Dispense  Refill  . BUPROPION HCL 75 MG PO TABS   Oral   Take 75 mg by mouth 2 (two) times daily.         . CYCLOBENZAPRINE HCL 10 MG PO TABS   Oral   Take 10 mg by mouth 3 (three) times daily as needed.         . OXYCODONE HCL 5 MG PO CAPS   Oral   Take 5 mg by mouth 2 times daily at 12 noon and 4 pm.         . ALPRAZOLAM ER 1 MG PO TB24   Oral   Take 1 mg by mouth 3 (three) times daily as needed.         Marland Kitchen FLUOXETINE HCL 20 MG PO CAPS   Oral   Take 20 mg by mouth daily.         Marland Kitchen LISINOPRIL-HYDROCHLOROTHIAZIDE 20-12.5 MG PO TABS   Oral   Take 1 tablet by mouth daily.           BP 124/83  Pulse 82  Temp 98.4 F (36.9 C) (Oral)  Resp 18  SpO2 100%  Physical Exam  Nursing note and vitals reviewed. Constitutional: He is oriented to person, place, and time. He appears well-developed and well-nourished. No distress.  HENT:  Head: Normocephalic and atraumatic.  Right Ear: External ear normal.  Mouth/Throat: Oropharynx is clear and moist. No oropharyngeal exudate.  Eyes: Conjunctivae normal and  EOM are normal. Pupils are equal, round, and reactive to light. No scleral icterus.  Neck: Normal range of motion. Neck supple.  Cardiovascular: Normal rate and regular rhythm.  Exam reveals no gallop and no friction rub.   No murmur heard. Pulmonary/Chest: Effort normal and breath sounds normal. He has no wheezes. He has no rales. He exhibits no tenderness.  Abdominal: Soft. There is no tenderness.  Musculoskeletal: Normal range of motion.       No midline spine tenderness. Left posterior and lateral hip tender to palpation along iliac crest. No obvious deformity or bruising noted. Bilateral hip ROM normal. No other joint tenderness noted.   Neurological: He is alert and oriented to person, place, and time. No cranial nerve deficit. Coordination normal.       Strength and sensation equal and intact bilaterally.  Cerebellar testing within normal limits. Speech is goal-oriented and occasionally slurred. Moves limbs without ataxia.   Skin: Skin is warm and dry. He is not diaphoretic.  Psychiatric: He has a normal mood and affect. His behavior is normal.    ED Course  Procedures (including critical care time)  Labs Reviewed  CBC WITH DIFFERENTIAL - Abnormal; Notable for the following:    WBC 2.8 (*)     Hemoglobin 10.8 (*)     HCT 33.4 (*)     MCH 25.5 (*)     RDW 17.6 (*)     Platelets 73 (*)     Neutro Abs 1.5 (*)     Lymphs Abs 0.5 (*)     Monocytes Relative 21 (*)     Eosinophils Relative 10 (*)     All other components within normal limits  COMPREHENSIVE METABOLIC PANEL - Abnormal; Notable for the following:    Glucose, Bld 112 (*)     Albumin 3.3 (*)     AST 113 (*)     All other components within normal limits  URINE RAPID DRUG SCREEN (HOSP PERFORMED) - Abnormal; Notable for the following:    Opiates POSITIVE (*)     Benzodiazepines POSITIVE (*)     All other components within normal limits  URINALYSIS, ROUTINE W REFLEX MICROSCOPIC - Abnormal; Notable for the following:    Color, Urine ORANGE (*)  BIOCHEMICALS MAY BE AFFECTED BY COLOR   APPearance CLOUDY (*)     Bilirubin Urine SMALL (*)     Ketones, ur 15 (*)     Leukocytes, UA TRACE (*)     All other components within normal limits  URINE MICROSCOPIC-ADD ON - Abnormal; Notable for the following:    Bacteria, UA FEW (*)     Crystals CA OXALATE CRYSTALS (*)     All other components within normal limits  AMMONIA - Abnormal; Notable for the following:    Ammonia 81 (*)     All other components within normal limits  URINE CULTURE   Dg Lumbar Spine Complete  04/01/2012  *RADIOLOGY REPORT*  Clinical Data: Back pain.  LUMBAR SPINE - COMPLETE 4+ VIEW  Comparison: None.  Findings: There is no visible lumbar spine fracture or traumatic subluxation.  2 mm of anterolisthesis L3 on L4 appears facet mediated.  Lower lumbar facet  arthropathy.  No worrisome osseous lesions.  Visualized pelvis unremarkable.  IMPRESSION: Degenerative changes as described.  No acute lumbar spine findings.   Original Report Authenticated By: Davonna Belling, M.D.    Dg Hip Complete Right  04/01/2012  *RADIOLOGY REPORT*  Clinical Data: Fall, right hip pain  RIGHT HIP - COMPLETE 2+ VIEW  Comparison: None.  Findings: No fracture or dislocation is seen.  Mild degenerative changes of the bilateral hips.  Visualized bony pelvis appears intact.  IMPRESSION: No fracture or dislocation is seen.   Original Report Authenticated By: Charline Bills, M.D.    Ct Head Wo Contrast  04/01/2012  *RADIOLOGY REPORT*  Clinical Data: Altered mental status  CT HEAD WITHOUT CONTRAST  Technique:  Contiguous axial images were obtained from the base of the skull through the vertex without contrast.  Comparison: 02/16/2009  Findings: No evidence of parenchymal hemorrhage or extra-axial fluid collection. No mass lesion, mass effect, or midline shift.  No CT evidence of acute infarction.  Cerebral volume is age appropriate.  No ventriculomegaly.  The visualized paranasal sinuses are essentially clear. The mastoid air cells are unopacified.  No evidence of calvarial fracture.  IMPRESSION: No evidence of acute intracranial abnormality.   Original Report Authenticated By: Charline Bills, M.D.      1. Hepatic encephalopathy       MDM  4:57 PM Labs pending. Patient will have urinalysis, hip xray, and IV hydration.   7:33 PM Patient feeling fine. Will have another bolus of fluid. Xray negative. Urinalysis reveals cloudy, orange urine with CA oxalate crystals. Patient's AST elevated at 113, ammonia elevated at 81. Urine positive for opiates and benzos due to prescription medications.   7:38 PM Patient will have a head CT and a plain film of lumbar spine and likely be admitted for observation for hepatic encephalopathy.   9:31 PM I spoke with Dr. Lovell Sheehan with hospitalist  service at [facility] for hepatic encephalopathy.      Emilia Beck, PA-C 04/01/12 2144

## 2012-04-01 NOTE — ED Notes (Signed)
Attempt to call report nurse not availab;e

## 2012-04-01 NOTE — ED Notes (Signed)
Wife Clydie Braun notified by phone of room number and [facility]spital

## 2012-04-01 NOTE — ED Notes (Signed)
Pt to triage in w/c with wife reporting that he bent over on Sunday, and felt a pulling in his low back. Per wife he was rx oxycodone 5mg  q12 hours and flexeril. Cont with "severe" pain to his right back area. Per pt "left side feels ok" pt denies any urinary symptoms. Pt states he cannot take nsaids due to his liver problems.

## 2012-04-01 NOTE — ED Notes (Addendum)
PA at bedside giving test results and plan of care. 

## 2012-04-01 NOTE — ED Notes (Signed)
Patient transported to X-ray 

## 2012-04-02 ENCOUNTER — Encounter (HOSPITAL_COMMUNITY): Payer: Self-pay | Admitting: Internal Medicine

## 2012-04-02 DIAGNOSIS — M549 Dorsalgia, unspecified: Secondary | ICD-10-CM

## 2012-04-02 DIAGNOSIS — K7290 Hepatic failure, unspecified without coma: Secondary | ICD-10-CM

## 2012-04-02 DIAGNOSIS — D689 Coagulation defect, unspecified: Secondary | ICD-10-CM

## 2012-04-02 DIAGNOSIS — K7682 Hepatic encephalopathy: Secondary | ICD-10-CM | POA: Diagnosis present

## 2012-04-02 DIAGNOSIS — K7460 Unspecified cirrhosis of liver: Secondary | ICD-10-CM

## 2012-04-02 DIAGNOSIS — D61818 Other pancytopenia: Secondary | ICD-10-CM

## 2012-04-02 LAB — BASIC METABOLIC PANEL
BUN: 7 mg/dL (ref 6–23)
Chloride: 104 mEq/L (ref 96–112)
GFR calc Af Amer: >90 mL/min (ref >90)
GFR calc non Af Amer: >90 mL/min (ref >90)
Potassium: 3.4 mEq/L — ABNORMAL LOW (ref 3.5–5.1)
Sodium: 138 mEq/L (ref 135–145)

## 2012-04-02 LAB — CBC
MCH: 26 pg (ref 26.0–34.0)
MCV: 79.3 fL (ref 78.0–100.0)
Platelets: 61 10*3/uL — ABNORMAL LOW (ref 150–400)
RDW: 17.6 % — ABNORMAL HIGH (ref 11.5–15.5)
WBC: 2.3 10*3/uL — ABNORMAL LOW (ref 4.0–10.5)

## 2012-04-02 MED ORDER — ONDANSETRON HCL 4 MG PO TABS: 4.0000 mg | ORAL_TABLET | Freq: Four times a day (QID) | ORAL | Status: DC | PRN

## 2012-04-02 MED ORDER — ALUM & MAG HYDROXIDE-SIMETH 200-200-20 MG/5ML PO SUSP: 30.0000 mL | Freq: Four times a day (QID) | ORAL | Status: DC | PRN

## 2012-04-02 MED ORDER — BUPROPION HCL 75 MG PO TABS
75.0000 mg | ORAL_TABLET | Freq: Two times a day (BID) | ORAL | Status: DC
  Administered 2012-04-02 – 2012-04-03 (×3): 75 mg via ORAL
  Filled 2012-04-02 (×4): qty 1

## 2012-04-02 MED ORDER — ALPRAZOLAM ER 1 MG PO TB24: 1.0000 mg | ORAL_TABLET | Freq: Every day | ORAL | Status: DC

## 2012-04-02 MED ORDER — ACETAMINOPHEN 650 MG RE SUPP: 650.0000 mg | Freq: Four times a day (QID) | RECTAL | Status: DC | PRN

## 2012-04-02 MED ORDER — SODIUM CHLORIDE 0.9 % IV SOLN
INTRAVENOUS | Status: DC
  Administered 2012-04-02: 01:00:00 via INTRAVENOUS

## 2012-04-02 MED ORDER — LACTULOSE 10 GM/15ML PO SOLN
20.0000 g | Freq: Three times a day (TID) | ORAL | Status: DC
  Administered 2012-04-02 – 2012-04-03 (×5): 20 g via ORAL
  Filled 2012-04-02 (×6): qty 30

## 2012-04-02 MED ORDER — FLUOXETINE HCL 20 MG PO CAPS
20.0000 mg | ORAL_CAPSULE | Freq: Every day | ORAL | Status: DC
  Administered 2012-04-02 – 2012-04-03 (×2): 20 mg via ORAL
  Filled 2012-04-02 (×2): qty 1

## 2012-04-02 MED ORDER — OXYCODONE HCL 5 MG PO TABS
5.0000 mg | ORAL_TABLET | Freq: Four times a day (QID) | ORAL | Status: DC | PRN
  Administered 2012-04-02 – 2012-04-03 (×3): 5 mg via ORAL
  Filled 2012-04-02 (×3): qty 1

## 2012-04-02 MED ORDER — ONDANSETRON HCL 4 MG/2ML IJ SOLN: 4.0000 mg | Freq: Four times a day (QID) | INTRAMUSCULAR | Status: DC | PRN

## 2012-04-02 MED ORDER — ALPRAZOLAM 0.5 MG PO TABS
1.0000 mg | ORAL_TABLET | Freq: Two times a day (BID) | ORAL | Status: DC
  Administered 2012-04-02 – 2012-04-03 (×2): 1 mg via ORAL
  Filled 2012-04-02: qty 1
  Filled 2012-04-02: qty 2

## 2012-04-02 MED ORDER — HYDROMORPHONE HCL PF 1 MG/ML IJ SOLN
0.5000 mg | INTRAMUSCULAR | Status: DC | PRN
  Administered 2012-04-02 (×2): 1 mg via INTRAVENOUS
  Filled 2012-04-02 (×2): qty 1

## 2012-04-02 MED ORDER — OXYCODONE HCL 5 MG PO TABS
5.0000 mg | ORAL_TABLET | ORAL | Status: DC | PRN
  Administered 2012-04-02: 5 mg via ORAL
  Filled 2012-04-02 (×2): qty 1

## 2012-04-02 MED ORDER — ACETAMINOPHEN 325 MG PO TABS: 650.0000 mg | ORAL_TABLET | Freq: Four times a day (QID) | ORAL | Status: DC | PRN

## 2012-04-02 NOTE — Progress Notes (Signed)
Subjective: [name] [name] is a 51 year old male with a history of hepatitis C and cirrhosis.  He recently was seen in the office by Dr. Donette Larry for back pain.  He was placed on Flexeril and Percocet and since that time has been having increased confusion.  His ammonia level was noted to be elevated on admission.  He reports being on Xanax 1 milligram 3 times a day, Prozac 20 milligrams daily and Wellbutrin 75 milligrams twice daily as well.  Today he is awake but lethargic.  He is conversive but speech is slow but not significantly slurred.  Moving all extremities well  Objective: Weight change:   Intake/Output Summary (Last 24 hours) at 04/02/12 1023 Last data filed at 04/02/12 0856  Gross per 24 hour  Intake    600 ml  Output   1250 ml  Net   -650 ml   Filed Vitals:   04/01/12 2156 04/01/12 2329 04/02/12 0614 04/02/12 1011  BP: 140/81 138/87 139/88 130/81  Pulse: 77 75 80 74  Temp:  98.3 F (36.8 C) 97.8 F (36.6 C) 97.6 F (36.4 C)  TempSrc:  Oral Oral Oral  Resp: 16 18 18 18   Height:  6\' 4"  (1.93 m)    Weight:  137 kg (302 lb 0.5 oz)    SpO2: 97% 97% 95% 96%    General Appearance: Alert, obese, cooperative, no distress, lethargic appearing Head: Normocephalic, without obvious abnormality, atraumatic Neck: Supple, symmetrical Lungs: Clear to auscultation bilaterally, respirations unlabored Heart: Regular rate and rhythm, S1 and S2 normal, no murmur, rub or gallop Abdomen: Soft, obese, non-tender, bowel sounds active all four quadrants, no masses, no organomegaly Extremities: Extremities normal, atraumatic, no cyanosis or edema Pulses: 2+ and symmetric all extremities Skin: Skin color, texture, turgor normal, no rashes or lesions Neuro: CNII-XII intact grossly.  Moves all extremities well.  No asterixis  Lab Results:  Basename 04/02/12 0425 04/01/12 1659  NA 138 138  K 3.4* 3.8  CL 104 102  CO2 27 29  GLUCOSE 96 112*  BUN 7 10  CREATININE 0.62 0.70  CALCIUM 8.4 9.1    MG -- --  PHOS -- --    Basename 04/01/12 1659  AST 113*  ALT 43  ALKPHOS 68  BILITOT 0.8  PROT 7.0  ALBUMIN 3.3*   No results found for this basename: LIPASE:2,AMYLASE:2 in the last 72 hours  Basename 04/02/12 0425 04/01/12 1659  WBC 2.3* 2.8*  NEUTROABS -- 1.5*  HGB 11.3* 10.8*  HCT 34.5* 33.4*  MCV 79.3 79.0  PLT 61* 73*   No results found for this basename: CKTOTAL:3,CKMB:3,CKMBINDEX:3,TROPONINI:3 in the last 72 hours No components found with this basename: POCBNP:3 No results found for this basename: DDIMER:2 in the last 72 hours No results found for this basename: HGBA1C:2 in the last 72 hours No results found for this basename: CHOL:2,HDL:2,LDLCALC:2,TRIG:2,CHOLHDL:2,LDLDIRECT:2 in the last 72 hours No results found for this basename: TSH,T4TOTAL,FREET3,T3FREE,THYROIDAB in the last 72 hours No results found for this basename: VITAMINB12:2,FOLATE:2,FERRITIN:2,TIBC:2,IRON:2,RETICCTPCT:2 in the last 72 hours  Studies/Results: Dg Lumbar Spine Complete  04/01/2012  *RADIOLOGY REPORT*  Clinical Data: Back pain.  LUMBAR SPINE - COMPLETE 4+ VIEW  Comparison: None.  Findings: There is no visible lumbar spine fracture or traumatic subluxation.  2 mm of anterolisthesis L3 on L4 appears facet mediated.  Lower lumbar facet arthropathy.  No worrisome osseous lesions.  Visualized pelvis unremarkable.  IMPRESSION: Degenerative changes as described.  No acute lumbar spine findings.   Original Report Authenticated  By: Davonna Belling, M.D.    Dg Hip Complete Right  04/01/2012  *RADIOLOGY REPORT*  Clinical Data: Fall, right hip pain  RIGHT HIP - COMPLETE 2+ VIEW  Comparison: None.  Findings: No fracture or dislocation is seen.  Mild degenerative changes of the bilateral hips.  Visualized bony pelvis appears intact.  IMPRESSION: No fracture or dislocation is seen.   Original Report Authenticated By: Charline Bills, M.D.    Ct Head Wo Contrast  04/01/2012  *RADIOLOGY REPORT*  Clinical  Data: Altered mental status  CT HEAD WITHOUT CONTRAST  Technique:  Contiguous axial images were obtained from the base of the skull through the vertex without contrast.  Comparison: 02/16/2009  Findings: No evidence of parenchymal hemorrhage or extra-axial fluid collection. No mass lesion, mass effect, or midline shift.  No CT evidence of acute infarction.  Cerebral volume is age appropriate.  No ventriculomegaly.  The visualized paranasal sinuses are essentially clear. The mastoid air cells are unopacified.  No evidence of calvarial fracture.  IMPRESSION: No evidence of acute intracranial abnormality.   Original Report Authenticated By: Charline Bills, M.D.    Medications: Scheduled Meds:   . ALPRAZolam  1 mg Oral Daily  . buPROPion  75 mg Oral BID  . FLUoxetine  20 mg Oral Daily  . lactulose  20 g Oral TID  . [COMPLETED] morphine  2 mg Intravenous Once  . [COMPLETED] sodium chloride  1,000 mL Intravenous Once  . [COMPLETED] sodium chloride  1,000 mL Intravenous Once   Continuous Infusions:   . sodium chloride 20 mL/hr at 04/02/12 0053   PRN Meds:.acetaminophen, acetaminophen, alum & mag hydroxide-simeth, HYDROmorphone (DILAUDID) injection, ondansetron (ZOFRAN) IV, ondansetron, oxyCODONE, [DISCONTINUED] oxyCODONE  Assessment/Plan:  Probable hepatic encephalopathy - change in mental status also likely contributed to by narcotics and benzodiazepines.  We'll try to keep these to a minimum and continue lactulose and reassess ammonia level and symptoms in a.m.   Pancytopenia - likely secondary to hepatitis C.  Check CBC in a.m.  Back pain - continue oxycodone but at lower doses and discontinue Flexeril.   LOS: 1 day   Torin Whisner NEVILL 04/02/2012, 10:23 AM

## 2012-04-02 NOTE — Progress Notes (Signed)
[name] [name] [id number] Admitted to 5500: 04/02/2012 00:01 Attending Provider: Clydia Llano, MD    [name] [name] is a 51 y.o. male patient admitted from ED awake, alert  & orientated  X 3,  Full Code, VSS - Blood pressure 138/87, pulse 75, temperature 98.3 F (36.8 C), temperature source Oral, resp. rate 18, height 6\' 4"  (1.93 m), weight 137 kg (302 lb 0.5 oz), SpO2 97.00%.RA. no c/o shortness of breath, no c/o chest pain, no distress noted. No telemetry ordered.   IV site WDL with a transparent dsg that's clean dry and intact.  Allergies:  No Known Allergies   Past Medical History  Diagnosis Date  . Thrombocytopenia   . Cirrhosis   . Hepatitis C   . Hypertension   . Anxiety   . Anemia   . Blood transfusion   . Obesity     History:  obtained from patient  Pt oriented to unit, room and routine. Information packet given to patient and safety video watched.  Admission INP armband ID verified with patient, and in place. SR up x 2, fall risk assessment complete with Patient verbalizing understanding of risks associated with falls. Pt verbalizes an understanding of how to use the call bell and to call for help before getting out of bed.  Skin, clean-dry- intact without evidence of bruising, or skin tears.   No evidence of skin break down noted on exam.    Will cont to monitor and assist as needed.  Elisha Ponder, RN 04/02/2012 2:56 AM

## 2012-04-02 NOTE — H&P (Addendum)
[facility] History and Physical  [name] [name] JXB:[id number] DOB: 08-28-1960 DOA: 04/01/2012  Referring physician: EDP PCP: Dr. Georgann Housekeeper Specialists:   Chief Complaint: Back Pain and Increasing Confusion  HPI: [name] [name] is a 51 y.o. male with a history of Cirrhosis and Hepatitis C who presented to the [facility] ED with complaints of increased falls and confusion along with increased back pain.  He has a history of chronic back pain and suffered 3 falls over the past 5 days, and he went to see his PCP who evaluated him and placed him on flexeril and percocet for his pain.   His pain continued and he also had increasing confusion.  When he was evaluated in the ED he was found to have an elevated NH3 level of 81 and was referred for admission to [facility].     Review of Systems: The patient denies anorexia, fever, weight loss,, vision loss, decreased hearing, hoarseness, chest pain, syncope, dyspnea on exertion, peripheral edema, balance deficits, hemoptysis, abdominal pain, melena, hematochezia, severe indigestion/heartburn, hematuria, incontinence, genital sores, muscle weakness, suspicious skin lesions, transient blindness, difficulty walking, depression, unusual weight change, abnormal bleeding, enlarged lymph nodes, angioedema, and breast masses.    Past Medical History  Diagnosis Date  . Thrombocytopenia   . Cirrhosis   . Hepatitis C   . Hypertension   . Anxiety   . Anemia   . Blood transfusion   . Obesity    Past Surgical History  Procedure Date  . Tonsillectomy     Medications:  HOME MEDS: Prior to Admission medications   Medication Sig Start Date End Date Taking? Authorizing Provider  ALPRAZolam (XANAX XR) 1 MG 24 hr tablet Take 1 mg by mouth 3 (three) times daily as needed.   Yes Historical Provider, MD  buPROPion (WELLBUTRIN) 75 MG tablet Take 75 mg by mouth 2 (two) times daily.   Yes Historical Provider, MD  cyclobenzaprine (FLEXERIL) 10 MG tablet  Take 10 mg by mouth 3 (three) times daily as needed.   Yes Historical Provider, MD  Flaxseed, Linseed, 1000 MG CAPS Take by mouth daily.   Yes Historical Provider, MD  FLUoxetine (PROZAC) 20 MG capsule Take 20 mg by mouth daily.   Yes Historical Provider, MD  lisinopril-hydrochlorothiazide (PRINZIDE,ZESTORETIC) 20-12.5 MG per tablet Take 1 tablet by mouth daily.   Yes Historical Provider, MD  milk thistle 175 MG tablet Take 175 mg by mouth daily.   Yes Historical Provider, MD  oxycodone (OXY-IR) 5 MG capsule Take 5 mg by mouth 2 times daily at 12 noon and 4 pm.   Yes Historical Provider, MD    No Known Allergies   Social History:  reports that he quit smoking about 16 months ago. His smoking use included Cigarettes. He does not have any smokeless tobacco history on file. He reports that he does not drink alcohol or use illicit drugs.   Family History  Problem Relation Age of Onset  . Cancer - Other Mother     Uterine  . Coronary artery disease Father   . Coronary artery disease Paternal Grandfather   . Hypertension Father   . Hypertension Paternal Grandfather     Physical Exam:  GEN: Pleasant Morbidly Obese Caucasian male examined  and in no acute distress; cooperative with exam Filed Vitals:   04/01/12 1621 04/01/12 2055 04/01/12 2156 04/01/12 2329  BP: 124/83 133/87 140/81 138/87  Pulse: 82 77 77 75  Temp: 98.4 F (36.9 C)   98.3 F (  36.8 C)  TempSrc: Oral   Oral  Resp: 18 16 16 18   Height:    6\' 4"  (1.93 m)  Weight:    137 kg (302 lb 0.5 oz)  SpO2: 100% 97% 97% 97%   Blood pressure 138/87, pulse 75, temperature 98.3 F (36.8 C), temperature source Oral, resp. rate 18, height 6\' 4"  (1.93 m), weight 137 kg (302 lb 0.5 oz), SpO2 97.00%. PSYCH: He is alert and oriented x4; does not appear anxious does not appear depressed; affect is normal HEENT: Normocephalic and Atraumatic, Mucous membranes pink; PERRLA; EOM intact; Fundi:  Benign;  No scleral icterus, Nares: Patent,  Oropharynx: Clear, Fair Dentition, Neck:  FROM, no cervical lymphadenopathy nor thyromegaly or carotid bruit; no JVD; Breasts:: Not examined CHEST WALL: No tenderness CHEST: Normal respiration, clear to auscultation bilaterally HEART: Regular rate and rhythm; no murmurs rubs or gallops BACK: No kyphosis or scoliosis; no CVA tenderness ABDOMEN: Positive Bowel Sounds, Obese, soft non-tender; no masses, no organomegaly, no pannus; no intertriginous candida. Rectal Exam: Not done EXTREMITIES: No bone or joint deformity; age-appropriate arthropathy of the hands and knees; no cyanosis, clubbing or edema; no ulcerations. Genitalia: not examined PULSES: 2+ and symmetric SKIN: Normal hydration no rash or ulceration CNS: Cranial nerves 2-12 grossly intact no focal neurologic deficit    Labs on Admission:  Basic Metabolic Panel:  Lab 04/01/12 1610  NA 138  K 3.8  CL 102  CO2 29  GLUCOSE 112*  BUN 10  CREATININE 0.70  CALCIUM 9.1  MG --  PHOS --   Liver Function Tests:  Lab 04/01/12 1659  AST 113*  ALT 43  ALKPHOS 68  BILITOT 0.8  PROT 7.0  ALBUMIN 3.3*   No results found for this basename: LIPASE:5,AMYLASE:5 in the last 168 hours  Lab 04/01/12 1820  AMMONIA 81*   CBC:  Lab 04/01/12 1659  WBC 2.8*  NEUTROABS 1.5*  HGB 10.8*  HCT 33.4*  MCV 79.0  PLT 73*   Cardiac Enzymes: No results found for this basename: CKTOTAL:5,CKMB:5,CKMBINDEX:5,TROPONINI:5 in the last 168 hours  BNP (last 3 results) No results found for this basename: PROBNP:3 in the last 8760 hours CBG: No results found for this basename: GLUCAP:5 in the last 168 hours  Radiological Exams on Admission: Dg Lumbar Spine Complete  04/01/2012  *RADIOLOGY REPORT*  Clinical Data: Back pain.  LUMBAR SPINE - COMPLETE 4+ VIEW  Comparison: None.  Findings: There is no visible lumbar spine fracture or traumatic subluxation.  2 mm of anterolisthesis L3 on L4 appears facet mediated.  Lower lumbar facet arthropathy.   No worrisome osseous lesions.  Visualized pelvis unremarkable.  IMPRESSION: Degenerative changes as described.  No acute lumbar spine findings.   Original Report Authenticated By: Davonna Belling, M.D.    Dg Hip Complete Right  04/01/2012  *RADIOLOGY REPORT*  Clinical Data: Fall, right hip pain  RIGHT HIP - COMPLETE 2+ VIEW  Comparison: None.  Findings: No fracture or dislocation is seen.  Mild degenerative changes of the bilateral hips.  Visualized bony pelvis appears intact.  IMPRESSION: No fracture or dislocation is seen.   Original Report Authenticated By: Charline Bills, M.D.    Ct Head Wo Contrast  04/01/2012  *RADIOLOGY REPORT*  Clinical Data: Altered mental status  CT HEAD WITHOUT CONTRAST  Technique:  Contiguous axial images were obtained from the base of the skull through the vertex without contrast.  Comparison: 02/16/2009  Findings: No evidence of parenchymal hemorrhage or extra-axial fluid collection. No mass  lesion, mass effect, or midline shift.  No CT evidence of acute infarction.  Cerebral volume is age appropriate.  No ventriculomegaly.  The visualized paranasal sinuses are essentially clear. The mastoid air cells are unopacified.  No evidence of calvarial fracture.  IMPRESSION: No evidence of acute intracranial abnormality.   Original Report Authenticated By: Charline Bills, M.D.      Assessment/Plan Principal Problem:  *Hepatic encephalopathy Active Problems:  HEPATITIS C  CIRRHOSIS  COAGULOPATHY  BACK PAIN, CHRONIC  Pancytopenia      Plan:       Admit to Med/Surg Bed Lactulose TID and monitor NH3 levels Reconcile Home Medications Consider starting Xifaxin.    SCDs for DVT prophylaxis.        Code Status: FULL CODE Family Communication:   N/A Disposition Plan:   Return to Home  Time spent: 69 Minutes  Ron Parker [facility] Pager [phone]  If 7PM-7AM, please contact night-coverage [email or web address] Password [email or web address] 04/02/2012, 12:44  AM

## 2012-04-03 LAB — CBC WITH DIFFERENTIAL/PLATELET
Basophils Absolute: 0 10*3/uL (ref 0.0–0.1)
Eosinophils Absolute: 0.2 10*3/uL (ref 0.0–0.7)
Eosinophils Relative: 9 % — ABNORMAL HIGH (ref 0–5)
MCH: 25.7 pg — ABNORMAL LOW (ref 26.0–34.0)
MCV: 78.9 fL (ref 78.0–100.0)
Platelets: 67 10*3/uL — ABNORMAL LOW (ref 150–400)
RDW: 17.5 % — ABNORMAL HIGH (ref 11.5–15.5)

## 2012-04-03 LAB — URINE CULTURE
Colony Count: 15000
Special Requests: NORMAL

## 2012-04-03 LAB — BASIC METABOLIC PANEL
Calcium: 8.9 mg/dL (ref 8.4–10.5)
GFR calc non Af Amer: >90 mL/min (ref >90)
Glucose, Bld: 98 mg/dL (ref 70–99)
Sodium: 135 mEq/L (ref 135–145)

## 2012-04-03 MED ORDER — LACTULOSE 10 GM/15ML PO SOLN: 20.0000 g | Freq: Every day | ORAL | Status: DC

## 2012-04-03 MED ORDER — OXYCODONE HCL 5 MG PO CAPS: 5.0000 mg | ORAL_CAPSULE | Freq: Two times a day (BID) | ORAL | Status: DC | PRN

## 2012-04-03 NOTE — Progress Notes (Signed)
04/03/12 Patient to be discharged today home with wife. IV site removed. Discharge instructions reviewed with patient and family.

## 2012-04-03 NOTE — Discharge Summary (Signed)
Physician Discharge Summary  [name] [name]  ZOX:[id number]  DOB: Mar 23, 1961   Admit date: 04/01/2012 Discharge date: 04/03/2012  Discharge Diagnoses:  Principal Problem:  Hepatic encephalopathy - mild to moderate and certainly has improved.  They have been picked over by use of muscle relaxants and pain medications.  He is also on chronic benzodiazepines which certainly could have contributed.  Will discharge on low-dose Chronulac.  Ammonia level down to the low 60s Active Problems:  HEPATITIS C - aware  CIRRHOSIS - aware, secondary to hepatitis C  BACK PAIN, CHRONIC - better.  We'll discharge on less pain medication and no muscle relaxants  Pancytopenia - chronic and stable and likely secondary to hepatitis C  Discharge physical exam:  Alert, conversive, with slightly depressed affect which wife says his standard for him  Filed Vitals:   04/02/12 2027 04/03/12 0244 04/03/12 0556 04/03/12 0943  BP: 127/77 134/81 127/89 116/78  Pulse: 65 74 78 84  Temp: 97.5 F (36.4 C) 97.5 F (36.4 C) 97.3 F (36.3 C) 97.4 F (36.3 C)  TempSrc: Oral Oral Oral Oral  Resp: 17 17 17 18   Height:      Weight:      SpO2: 95% 97% 96% 96%   General Appearance: Alert, obese, cooperative, no distress, relatively flat affect Head: Normocephalic, without obvious abnormality, atraumatic  Neck: Supple, symmetrical  Lungs: Clear to auscultation bilaterally, respirations unlabored  Heart: Regular rate and rhythm, S1 and S2 normal, no murmur, rub or gallop  Abdomen: Soft, obese, non-tender, bowel sounds active all four quadrants, no masses, no organomegaly  Extremities: Extremities normal, atraumatic, no cyanosis or edema  Pulses: 2+ and symmetric all extremities  Skin: Skin color, texture, turgor normal, no rashes or lesions  Neuro: CNII-XII intact grossly. Moves all extremities well. No asterixis  Discharge Condition: Mental status much improved  Hospital Course: 51 year old male with history of  hepatitis C and cirrhosis.  Recently seen in the office by Dr. Donette Larry and started on narcotic therapy as well as muscle relaxants.  He is already on benzodiazepines and presented with delirium with a moderately elevated level of ammonia.  Has improved with reduction in pain medication and muscle relaxants and instituting Chronulac therapy.  Mental status is back to normal this morning according to his wife.  Will discharge home on a low dose of Chronulac with a reduction in dose of oxycodone and discontinuation of Flexeril.  Recommended reducing meats in diet by one half with hopes of reducing nitrogen levels  Consults: N/A  Disposition: 01-Home or Self Care  Discharge Orders    Future Orders Please Complete By Expires   Diet - low sodium heart healthy      Scheduling Instructions:   Relatively low protein diet is more ideal so that lasts ammonia is formed.  Cutting back to half as much meat in diet would probably be helpful in general   Increase activity slowly      Discharge instructions      Comments:   Call [phone] or [phone] for appointment later in the week with Dr. Donette Larry Probably best to start the lactulose/Chronulac at bedtime   Call MD for:  temperature >100.4      Call MD for:  persistant nausea and vomiting      Call MD for:  severe uncontrolled pain      Call MD for:  difficulty breathing, headache or visual disturbances      Call MD for:  persistant dizziness or light-headedness  Medication List     As of 04/03/2012  9:35 AM    STOP taking these medications         cyclobenzaprine 10 MG tablet   Commonly known as: FLEXERIL      TAKE these medications         ALPRAZolam 1 MG tablet   Commonly known as: XANAX   Take 1 mg by mouth 3 (three) times daily.      buPROPion 75 MG tablet   Commonly known as: WELLBUTRIN   Take 75 mg by mouth 2 (two) times daily.      Flaxseed (Linseed) 1000 MG Caps   Take 1 capsule by mouth daily.      FLUoxetine 20 MG  capsule   Commonly known as: PROZAC   Take 20 mg by mouth daily.      lactulose 10 GM/15ML solution   Commonly known as: CHRONULAC   Take 30 mLs (20 g total) by mouth at bedtime.      lisinopril-hydrochlorothiazide 20-12.5 MG per tablet   Commonly known as: PRINZIDE,ZESTORETIC   Take 1 tablet by mouth daily.      milk thistle 175 MG tablet   Take 175 mg by mouth daily.      oxycodone 5 MG capsule   Commonly known as: OXY-IR   Take 1 capsule (5 mg total) by mouth 2 (two) times daily between meals as needed. For pain         Things to follow up in the outpatient setting:  Mental status.  Patient will be continued followup for low back pain and followup with his psychiatrist.  Patient is not exhibiting symptoms of suicidal ideation and is currently calm  Time coordinating discharge: 32 minutes  The results of significant diagnostics from this hospitalization (including imaging, microbiology, ancillary and laboratory) are listed below for reference.    Significant Diagnostic Studies: Dg Lumbar Spine Complete  04/01/2012  *RADIOLOGY REPORT*  Clinical Data: Back pain.  LUMBAR SPINE - COMPLETE 4+ VIEW  Comparison: None.  Findings: There is no visible lumbar spine fracture or traumatic subluxation.  2 mm of anterolisthesis L3 on L4 appears facet mediated.  Lower lumbar facet arthropathy.  No worrisome osseous lesions.  Visualized pelvis unremarkable.  IMPRESSION: Degenerative changes as described.  No acute lumbar spine findings.   Original Report Authenticated By: Davonna Belling, M.D.    Dg Hip Complete Right  04/01/2012  *RADIOLOGY REPORT*  Clinical Data: Fall, right hip pain  RIGHT HIP - COMPLETE 2+ VIEW  Comparison: None.  Findings: No fracture or dislocation is seen.  Mild degenerative changes of the bilateral hips.  Visualized bony pelvis appears intact.  IMPRESSION: No fracture or dislocation is seen.   Original Report Authenticated By: Charline Bills, M.D.    Ct Head Wo  Contrast  04/01/2012  *RADIOLOGY REPORT*  Clinical Data: Altered mental status  CT HEAD WITHOUT CONTRAST  Technique:  Contiguous axial images were obtained from the base of the skull through the vertex without contrast.  Comparison: 02/16/2009  Findings: No evidence of parenchymal hemorrhage or extra-axial fluid collection. No mass lesion, mass effect, or midline shift.  No CT evidence of acute infarction.  Cerebral volume is age appropriate.  No ventriculomegaly.  The visualized paranasal sinuses are essentially clear. The mastoid air cells are unopacified.  No evidence of calvarial fracture.  IMPRESSION: No evidence of acute intracranial abnormality.   Original Report Authenticated By: Charline Bills, M.D.     Microbiology: No results  found for this or any previous visit (from the past 240 hour(s)).   Labs: Results for orders placed during the hospital encounter of 04/01/12  CBC WITH DIFFERENTIAL      Component Value Range   WBC 2.8 (*) 4.0 - 10.5 K/uL   RBC 4.23  4.22 - 5.81 MIL/uL   Hemoglobin 10.8 (*) 13.0 - 17.0 g/dL   HCT 45.4 (*) 09.8 - 11.9 %   MCV 79.0  78.0 - 100.0 fL   MCH 25.5 (*) 26.0 - 34.0 pg   MCHC 32.3  30.0 - 36.0 g/dL   RDW 14.7 (*) 82.9 - 56.2 %   Platelets 73 (*) 150 - 400 K/uL   Neutrophils Relative 51  43 - 77 %   Neutro Abs 1.5 (*) 1.7 - 7.7 K/uL   Lymphocytes Relative 16  12 - 46 %   Lymphs Abs 0.5 (*) 0.7 - 4.0 K/uL   Monocytes Relative 21 (*) 3 - 12 %   Monocytes Absolute 0.6  0.1 - 1.0 K/uL   Eosinophils Relative 10 (*) 0 - 5 %   Eosinophils Absolute 0.3  0.0 - 0.7 K/uL   Basophils Relative 1  0 - 1 %   Basophils Absolute 0.0  0.0 - 0.1 K/uL  COMPREHENSIVE METABOLIC PANEL      Component Value Range   Sodium 138  135 - 145 mEq/L   Potassium 3.8  3.5 - 5.1 mEq/L   Chloride 102  96 - 112 mEq/L   CO2 29  19 - 32 mEq/L   Glucose, Bld 112 (*) 70 - 99 mg/dL   BUN 10  6 - 23 mg/dL   Creatinine, Ser 1.30  0.50 - 1.35 mg/dL   Calcium 9.1  8.4 - 86.5 mg/dL    Total Protein 7.0  6.0 - 8.3 g/dL   Albumin 3.3 (*) 3.5 - 5.2 g/dL   AST 784 (*) 0 - 37 U/L   ALT 43  0 - 53 U/L   Alkaline Phosphatase 68  39 - 117 U/L   Total Bilirubin 0.8  0.3 - 1.2 mg/dL   GFR calc non Af Amer >90  >90 mL/min   GFR calc Af Amer >90  >90 mL/min  URINE RAPID DRUG SCREEN (HOSP PERFORMED)      Component Value Range   Opiates POSITIVE (*) NONE DETECTED   Cocaine NONE DETECTED  NONE DETECTED   Benzodiazepines POSITIVE (*) NONE DETECTED   Amphetamines NONE DETECTED  NONE DETECTED   Tetrahydrocannabinol NONE DETECTED  NONE DETECTED   Barbiturates NONE DETECTED  NONE DETECTED  URINALYSIS, ROUTINE W REFLEX MICROSCOPIC      Component Value Range   Color, Urine ORANGE (*) YELLOW   APPearance CLOUDY (*) CLEAR   Specific Gravity, Urine 1.030  1.005 - 1.030   pH 6.0  5.0 - 8.0   Glucose, UA NEGATIVE  NEGATIVE mg/dL   Hgb urine dipstick NEGATIVE  NEGATIVE   Bilirubin Urine SMALL (*) NEGATIVE   Ketones, ur 15 (*) NEGATIVE mg/dL   Protein, ur NEGATIVE  NEGATIVE mg/dL   Urobilinogen, UA 1.0  0.0 - 1.0 mg/dL   Nitrite NEGATIVE  NEGATIVE   Leukocytes, UA TRACE (*) NEGATIVE  URINE MICROSCOPIC-ADD ON      Component Value Range   Squamous Epithelial / LPF RARE  RARE   WBC, UA 0-2  <3 WBC/hpf   RBC / HPF 0-2  <3 RBC/hpf   Bacteria, UA FEW (*) RARE   Crystals CA  OXALATE CRYSTALS (*) NEGATIVE   Urine-Other MUCOUS PRESENT    AMMONIA      Component Value Range   Ammonia 81 (*) 11 - 60 umol/L  BASIC METABOLIC PANEL      Component Value Range   Sodium 138  135 - 145 mEq/L   Potassium 3.4 (*) 3.5 - 5.1 mEq/L   Chloride 104  96 - 112 mEq/L   CO2 27  19 - 32 mEq/L   Glucose, Bld 96  70 - 99 mg/dL   BUN 7  6 - 23 mg/dL   Creatinine, Ser 1.61  0.50 - 1.35 mg/dL   Calcium 8.4  8.4 - 09.6 mg/dL   GFR calc non Af Amer >90  >90 mL/min   GFR calc Af Amer >90  >90 mL/min  CBC      Component Value Range   WBC 2.3 (*) 4.0 - 10.5 K/uL   RBC 4.35  4.22 - 5.81 MIL/uL   Hemoglobin  11.3 (*) 13.0 - 17.0 g/dL   HCT 04.5 (*) 40.9 - 81.1 %   MCV 79.3  78.0 - 100.0 fL   MCH 26.0  26.0 - 34.0 pg   MCHC 32.8  30.0 - 36.0 g/dL   RDW 91.4 (*) 78.2 - 95.6 %   Platelets 61 (*) 150 - 400 K/uL  AMMONIA      Component Value Range   Ammonia 68 (*) 11 - 60 umol/L  AMMONIA      Component Value Range   Ammonia 65 (*) 11 - 60 umol/L  CBC WITH DIFFERENTIAL      Component Value Range   WBC 2.7 (*) 4.0 - 10.5 K/uL   RBC 4.70  4.22 - 5.81 MIL/uL   Hemoglobin 12.1 (*) 13.0 - 17.0 g/dL   HCT 21.3 (*) 08.6 - 57.8 %   MCV 78.9  78.0 - 100.0 fL   MCH 25.7 (*) 26.0 - 34.0 pg   MCHC 32.6  30.0 - 36.0 g/dL   RDW 46.9 (*) 62.9 - 52.8 %   Platelets 67 (*) 150 - 400 K/uL   Neutrophils Relative 60  43 - 77 %   Neutro Abs 1.6 (*) 1.7 - 7.7 K/uL   Lymphocytes Relative 14  12 - 46 %   Lymphs Abs 0.4 (*) 0.7 - 4.0 K/uL   Monocytes Relative 16 (*) 3 - 12 %   Monocytes Absolute 0.4  0.1 - 1.0 K/uL   Eosinophils Relative 9 (*) 0 - 5 %   Eosinophils Absolute 0.2  0.0 - 0.7 K/uL   Basophils Relative 2 (*) 0 - 1 %   Basophils Absolute 0.0  0.0 - 0.1 K/uL  BASIC METABOLIC PANEL      Component Value Range   Sodium 135  135 - 145 mEq/L   Potassium 3.7  3.5 - 5.1 mEq/L   Chloride 102  96 - 112 mEq/L   CO2 24  19 - 32 mEq/L   Glucose, Bld 98  70 - 99 mg/dL   BUN 8  6 - 23 mg/dL   Creatinine, Ser 4.13  0.50 - 1.35 mg/dL   Calcium 8.9  8.4 - 24.4 mg/dL   GFR calc non Af Amer >90  >90 mL/min   GFR calc Af Amer >90  >90 mL/min     Signed: Juelz Whittenberg NEVILL 04/03/2012, 9:35 AM

## 2012-04-04 NOTE — ED Provider Notes (Signed)
History/physical exam/procedure(s) were performed by non-physician practitioner and as supervising physician I was immediately available for consultation/collaboration. I have reviewed all notes and am in agreement with care and plan.   Hilario Quarry, MD 04/04/12 [phone]

## 2012-04-05 NOTE — Progress Notes (Signed)
UR Completed.  Annalese Stiner Jane [phone] 04/05/2012  

## 2012-06-12 ENCOUNTER — Emergency Department (HOSPITAL_COMMUNITY)
Admission: EM | Admit: 2012-06-12 | Discharge: 2012-06-13 | Disposition: A | Discharge status: Other Acute Inpatient Hospital | Payer: Medicare Other | Attending: Emergency Medicine | Admitting: Emergency Medicine

## 2012-06-12 ENCOUNTER — Encounter (HOSPITAL_COMMUNITY): Payer: Self-pay | Admitting: *Deleted

## 2012-06-12 DIAGNOSIS — Z862 Personal history of diseases of the blood and blood-forming organs and certain disorders involving the immune mechanism: Secondary | ICD-10-CM | POA: Insufficient documentation

## 2012-06-12 DIAGNOSIS — E669 Obesity, unspecified: Secondary | ICD-10-CM | POA: Insufficient documentation

## 2012-06-12 DIAGNOSIS — T43502A Poisoning by unspecified antipsychotics and neuroleptics, intentional self-harm, initial encounter: Secondary | ICD-10-CM | POA: Insufficient documentation

## 2012-06-12 DIAGNOSIS — F411 Generalized anxiety disorder: Secondary | ICD-10-CM | POA: Insufficient documentation

## 2012-06-12 DIAGNOSIS — B182 Chronic viral hepatitis C: Secondary | ICD-10-CM | POA: Insufficient documentation

## 2012-06-12 DIAGNOSIS — Z0000 Encounter for general adult medical examination without abnormal findings: Secondary | ICD-10-CM | POA: Insufficient documentation

## 2012-06-12 DIAGNOSIS — Z79899 Other long term (current) drug therapy: Secondary | ICD-10-CM | POA: Insufficient documentation

## 2012-06-12 DIAGNOSIS — Z8719 Personal history of other diseases of the digestive system: Secondary | ICD-10-CM | POA: Insufficient documentation

## 2012-06-12 DIAGNOSIS — F3289 Other specified depressive episodes: Secondary | ICD-10-CM | POA: Insufficient documentation

## 2012-06-12 DIAGNOSIS — I10 Essential (primary) hypertension: Secondary | ICD-10-CM | POA: Insufficient documentation

## 2012-06-12 DIAGNOSIS — T438X2A Poisoning by other psychotropic drugs, intentional self-harm, initial encounter: Secondary | ICD-10-CM | POA: Insufficient documentation

## 2012-06-12 DIAGNOSIS — F329 Major depressive disorder, single episode, unspecified: Secondary | ICD-10-CM

## 2012-06-12 LAB — SALICYLATE LEVEL: Salicylate Lvl: <2 mg/dL — ABNORMAL LOW (ref 2.8–20.0)

## 2012-06-12 LAB — URINALYSIS, ROUTINE W REFLEX MICROSCOPIC
Leukocytes, UA: NEGATIVE
Nitrite: NEGATIVE
Urobilinogen, UA: 1 mg/dL (ref 0.0–1.0)

## 2012-06-12 LAB — ETHYLENE GLYCOL: Ethylene Glycol Lvl: <5

## 2012-06-12 LAB — RAPID URINE DRUG SCREEN, HOSP PERFORMED: Barbiturates: NOT DETECTED

## 2012-06-12 LAB — CBC
MCH: 27.5 pg (ref 26.0–34.0)
Platelets: 78 10*3/uL — ABNORMAL LOW (ref 150–400)
RBC: 4.58 MIL/uL (ref 4.22–5.81)
WBC: 4.2 10*3/uL (ref 4.0–10.5)

## 2012-06-12 LAB — ETHANOL: Alcohol, Ethyl (B): <11 mg/dL (ref 0–11)

## 2012-06-12 LAB — COMPREHENSIVE METABOLIC PANEL
Alkaline Phosphatase: 65 U/L (ref 39–117)
BUN: 12 mg/dL (ref 6–23)
Chloride: 104 mEq/L (ref 96–112)
Creatinine, Ser: 0.62 mg/dL (ref 0.50–1.35)
GFR calc Af Amer: >90 mL/min (ref >90)
Glucose, Bld: 98 mg/dL (ref 70–99)
Sodium: 137 mEq/L (ref 135–145)
Total Bilirubin: 0.8 mg/dL (ref 0.3–1.2)
Total Protein: 6.5 g/dL (ref 6.0–8.3)

## 2012-06-12 LAB — ACETAMINOPHEN LEVEL: Acetaminophen (Tylenol), Serum: 26.3 ug/mL (ref 10–30)

## 2012-06-12 MED ORDER — LORAZEPAM 1 MG PO TABS
2.0000 mg | ORAL_TABLET | Freq: Three times a day (TID) | ORAL | Status: DC | PRN
  Administered 2012-06-12 – 2012-06-13 (×2): 2 mg via ORAL
  Filled 2012-06-12 (×2): qty 2

## 2012-06-12 MED ORDER — LISINOPRIL-HYDROCHLOROTHIAZIDE 20-12.5 MG PO TABS: 1.0000 | ORAL_TABLET | Freq: Every morning | ORAL | Status: DC

## 2012-06-12 MED ORDER — HYDROCHLOROTHIAZIDE 12.5 MG PO CAPS
12.5000 mg | ORAL_CAPSULE | Freq: Every day | ORAL | Status: DC
  Administered 2012-06-12 – 2012-06-13 (×2): 12.5 mg via ORAL
  Filled 2012-06-12 (×2): qty 1

## 2012-06-12 MED ORDER — MILK THISTLE 175 MG PO TABS: 175.0000 mg | ORAL_TABLET | Freq: Every day | ORAL | Status: DC

## 2012-06-12 MED ORDER — SODIUM CHLORIDE 0.9 % IV SOLN
INTRAVENOUS | Status: DC
  Administered 2012-06-12 (×2): via INTRAVENOUS

## 2012-06-12 MED ORDER — FLAXSEED (LINSEED) 1000 MG PO CAPS: 1.0000 | ORAL_CAPSULE | Freq: Every morning | ORAL | Status: DC

## 2012-06-12 MED ORDER — LISINOPRIL 20 MG PO TABS
20.0000 mg | ORAL_TABLET | Freq: Every day | ORAL | Status: DC
  Administered 2012-06-12 – 2012-06-13 (×2): 20 mg via ORAL
  Filled 2012-06-12 (×2): qty 1

## 2012-06-12 MED ORDER — LACTULOSE 10 GM/15ML PO SOLN
20.0000 g | Freq: Every day | ORAL | Status: DC
  Administered 2012-06-13: 20 g via ORAL
  Filled 2012-06-12 (×2): qty 30

## 2012-06-12 MED ORDER — FLUOXETINE HCL 20 MG PO CAPS
20.0000 mg | ORAL_CAPSULE | Freq: Every morning | ORAL | Status: DC
  Administered 2012-06-12 – 2012-06-13 (×2): 20 mg via ORAL
  Filled 2012-06-12 (×2): qty 1

## 2012-06-12 MED ORDER — CYCLOBENZAPRINE HCL 10 MG PO TABS: 10.0000 mg | ORAL_TABLET | Freq: Every evening | ORAL | Status: DC | PRN

## 2012-06-12 MED ORDER — ADULT MULTIVITAMIN W/MINERALS CH
1.0000 | ORAL_TABLET | Freq: Every morning | ORAL | Status: DC
  Administered 2012-06-12 – 2012-06-13 (×2): 1 via ORAL
  Filled 2012-06-12 (×2): qty 1

## 2012-06-12 MED ORDER — SODIUM CHLORIDE 0.9 % IV BOLUS (SEPSIS)
1000.0000 mL | Freq: Once | INTRAVENOUS | Status: AC
  Administered 2012-06-12: 1000 mL via INTRAVENOUS

## 2012-06-12 NOTE — ED Notes (Signed)
Patient is resting comfortably. 

## 2012-06-12 NOTE — ED Provider Notes (Addendum)
History     CSN: [id number]  Arrival date & time 06/12/12  0110   First MD Initiated Contact with Patient 06/12/12 0130      Chief Complaint  Patient presents with  . Medical Clearance    (Consider location/radiation/quality/duration/timing/severity/associated sxs/prior treatment) HPI Hx per PT.  OD on Wellbutrin presents drowsy h/o depression, took between 20 and 30 tabs of  tabs around 8pm, limited historian 2/2 condition, denies any other ingestions tonight. Level 5 caveat applies.  Past Medical History  Diagnosis Date  . Thrombocytopenia   . Cirrhosis   . Hepatitis C   . Hypertension   . Anxiety   . Anemia   . Blood transfusion   . Obesity     Past Surgical History  Procedure Date  . Tonsillectomy     Family History  Problem Relation Age of Onset  . Cancer - Other Mother     Uterine  . Coronary artery disease Father   . Coronary artery disease Paternal Grandfather   . Hypertension Father   . Hypertension Paternal Grandfather     History  Substance Use Topics  . Smoking status: Former Smoker    Types: Cigarettes    Quit date: 11/05/2010  . Smokeless tobacco: Not on file  . Alcohol Use: No      Review of Systems  Unable to perform ROS level 5 caveat  Allergies  Review of patient's allergies indicates no known allergies.  Home Medications   Current Outpatient Rx  Name  Route  Sig  Dispense  Refill  . ALPRAZOLAM 1 MG PO TABS   Oral   Take 1 mg by mouth 3 (three) times daily.         . BUPROPION HCL 75 MG PO TABS   Oral   Take 75 mg by mouth 2 (two) times daily.         Marland Kitchen FLAXSEED (LINSEED) 1000 MG PO CAPS   Oral   Take 1 capsule by mouth daily.          Marland Kitchen FLUOXETINE HCL 20 MG PO CAPS   Oral   Take 20 mg by mouth daily.         Marland Kitchen LACTULOSE 10 GM/15ML PO SOLN   Oral   Take 30 mLs (20 g total) by mouth at bedtime.   240 mL   3   . LISINOPRIL-HYDROCHLOROTHIAZIDE 20-12.5 MG PO TABS   Oral   Take 1 tablet by mouth daily.           Marland Kitchen MILK THISTLE 175 MG PO TABS   Oral   Take 175 mg by mouth daily.         . OXYCODONE HCL 5 MG PO CAPS   Oral   Take 1 capsule (5 mg total) by mouth 2 (two) times daily between meals as needed. For pain   10 capsule   0     There were no vitals taken for this visit.  Physical Exam  Constitutional: He appears well-developed and well-nourished.  HENT:  Head: Normocephalic and atraumatic.       Dry mucous membranes  Eyes: EOM are normal. Pupils are equal, round, and reactive to light. No scleral icterus.  Neck: Neck supple.  Cardiovascular: Normal rate, regular rhythm and intact distal pulses.   Pulmonary/Chest: Effort normal and breath sounds normal. No respiratory distress.  Abdominal: Soft. Bowel sounds are normal. He exhibits no distension. There is no tenderness.  Musculoskeletal: Normal range of motion. He  exhibits no edema.  Neurological:       Awake with slow speech. No unilateral deficits  Skin: Skin is warm and dry.    ED Course  Procedures (including critical care time)  Results for orders placed during the hospital encounter of 06/12/12  CBC      Component Value Range   WBC 4.2  4.0 - 10.5 K/uL   RBC 4.58  4.22 - 5.81 MIL/uL   Hemoglobin 12.6 (*) 13.0 - 17.0 g/dL   HCT 16.1 (*) 09.6 - 04.5 %   MCV 83.6  78.0 - 100.0 fL   MCH 27.5  26.0 - 34.0 pg   MCHC 32.9  30.0 - 36.0 g/dL   RDW 40.9 (*) 81.1 - 91.4 %   Platelets 78 (*) 150 - 400 K/uL  COMPREHENSIVE METABOLIC PANEL      Component Value Range   Sodium 137  135 - 145 mEq/L   Potassium 3.7  3.5 - 5.1 mEq/L   Chloride 104  96 - 112 mEq/L   CO2 25  19 - 32 mEq/L   Glucose, Bld 98  70 - 99 mg/dL   BUN 12  6 - 23 mg/dL   Creatinine, Ser 7.82  0.50 - 1.35 mg/dL   Calcium 8.2 (*) 8.4 - 10.5 mg/dL   Total Protein 6.5  6.0 - 8.3 g/dL   Albumin 3.2 (*) 3.5 - 5.2 g/dL   AST 68 (*) 0 - 37 U/L   ALT 33  0 - 53 U/L   Alkaline Phosphatase 65  39 - 117 U/L   Total Bilirubin 0.8  0.3 - 1.2 mg/dL   GFR  calc non Af Amer >90  >90 mL/min   GFR calc Af Amer >90  >90 mL/min  ETHANOL      Component Value Range   Alcohol, Ethyl (B) <11  0 - 11 mg/dL  URINALYSIS, ROUTINE W REFLEX MICROSCOPIC      Component Value Range   Color, Urine AMBER (*) YELLOW   APPearance CLEAR  CLEAR   Specific Gravity, Urine 1.042 (*) 1.005 - 1.030   pH 5.5  5.0 - 8.0   Glucose, UA NEGATIVE  NEGATIVE mg/dL   Hgb urine dipstick NEGATIVE  NEGATIVE   Bilirubin Urine SMALL (*) NEGATIVE   Ketones, ur TRACE (*) NEGATIVE mg/dL   Protein, ur NEGATIVE  NEGATIVE mg/dL   Urobilinogen, UA 1.0  0.0 - 1.0 mg/dL   Nitrite NEGATIVE  NEGATIVE   Leukocytes, UA NEGATIVE  NEGATIVE  URINE RAPID DRUG SCREEN (HOSP PERFORMED)      Component Value Range   Opiates POSITIVE (*) NONE DETECTED   Cocaine POSITIVE (*) NONE DETECTED   Benzodiazepines POSITIVE (*) NONE DETECTED   Amphetamines POSITIVE (*) NONE DETECTED   Tetrahydrocannabinol NONE DETECTED  NONE DETECTED   Barbiturates NONE DETECTED  NONE DETECTED  SALICYLATE LEVEL      Component Value Range   Salicylate Lvl <2.0 (*) 2.8 - 20.0 mg/dL  ACETAMINOPHEN LEVEL      Component Value Range   Acetaminophen (Tylenol), Serum 26.3  10 - 30 ug/mL     Date: 06/12/2012  Rate: 90  Rhythm: normal sinus rhythm  QRS Axis: normal  Intervals: normal  ST/T Wave abnormalities: nonspecific ST changes  Conduction Disutrbances:nonspecific intraventricular conduction delay  Narrative Interpretation: NSR with QRS 110  Old EKG Reviewed: changes noted previous ECG 09-29-11 NSR QRS 90  IVFs. Serial evaluations  6:26 AM PT admits to depression, he denies drinking  any antifreeze.  No self injury.  He admits to recreational drug use and chronic medical problems. Plan telepsych consult and continue IVFs   MDM  OD with h/o depression. Labs. Poison control consult.  IVFs, PSY consult requested.        Sunnie Nielsen, MD 06/12/12 2313  Sunnie Nielsen, MD 06/23/12 [phone]

## 2012-06-12 NOTE — ED Notes (Signed)
Call received from [facility], in poison control requesting pt's vitals and clinical status. Information given. Vw, rn.

## 2012-06-12 NOTE — ED Notes (Addendum)
Poison Control called.  Spoke with Revonda Standard. Tylenol, aspirin, ETOH, and UDS recommended. Check electrolytes. ABG to check for acidosis. ETOH will block any antifreeze if the pt actually consumed this. May want to consider blocking if antizol.  If levels for Methanol and ethylene glycol, closest lab is at [facility].

## 2012-06-12 NOTE — Progress Notes (Signed)
PHARMACIST - PHYSICIAN ORDER COMMUNICATION  CONCERNING: P&T Medication Policy on Herbal Medications  DESCRIPTION:  This patient's orders for Flaxseed capsules and Milk Thistle tablets  have been noted.  These products are classified as "herbal" or "natural" products.  Due to a lack of definitive safety studies or FDA approval, nonstandard manufacturing practices, plus the potential risk of unknown drug-drug interactions while on inpatient medications, the Pharmacy and Therapeutics Committee does not permit the use of "herbal" or "natural products" of this type within [facility].   ACTION TAKEN: The Pharmacy Department is unable to verify these orders at this time and your patient has been informed of this safety policy. Please reevaluate patient's clinical condition at discharge and address whether or not the herbal or natural products should be resumed at that time.  Polo Riley R.Ph. 06/12/2012 12:14 PM

## 2012-06-12 NOTE — ED Notes (Signed)
Pt admits to overdosing on Wellbutrin 100 mg x between 20-30 tabs. Pt unable to give accurate count.

## 2012-06-12 NOTE — ED Notes (Signed)
Patient states that he took between 20 and 30 pills between 730 and 8pm. Patient alert and oriented. Patient states that he drove himself here to the hospital. Patient denies pain or discomfort at this time. Patient currently exhibiting slow speech. Dr. Dierdre Highman at bedside; patient placed on cardiac monitor--monitor shows sinus rhythm without ectopy. BP 136/90, room air O2 sat 97%.

## 2012-06-12 NOTE — Progress Notes (Signed)
Patient accepted by Shuvon Rankin, NP to [facility] pending bed availability. Currently no beds at [facility]. Raynah Gomes, RN 

## 2012-06-12 NOTE — BH Assessment (Signed)
Assessment Note   [name] [name] is an 52 y.o. male.   Pt reports taking 20 Wellbutrin tablets of 20mg  in an attempt to overdose.  Pt then pulled in the parking lot of [facility] after 15 minutes of taking overdose to get help.  Pt denies current desire to commit suicide, reports "I don't know why I did it to start with, I have so much to live for and I just want to go home."  Pt denies HI, AVH.  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    Pt reports his wife is a great support.  Pt reports he woke her up to tell her he was going out to get something to eat around 1am.  Pt took the Wellbutrin medications with him.    Pt denies intentionally leaving the house to end his life.  "It was more impulsive than anything.  I have some medical problems, I am not working and I thought my wife would be better off without me.  I have never done this before and I am ashamed or myself."  Pt sees Dr. Evelene Croon for medication management.  Pt recommended to see [facility] for optx for talk therapy.  Typically overdose patients are recommended for inpatient treatment.  Tele Psych may recommend said treatment protocol.  Individuals do not just wake up or get up and overdose without thought in most cases.    However, pt does not currently endorse suicidal ideation, intent.  Pt appears to be able to contract for safety and has support systems in place and supposedly a supportive care giver (although efforts to reach wife have been unsuccessful).  Axis I: Major Depression, Recurrent severe Axis II: Deferred Axis III:  Past Medical History  Diagnosis Date  . Thrombocytopenia   . Cirrhosis   . Hepatitis C   . Hypertension   . Anxiety   . Anemia   . Blood transfusion   . Obesity    Axis IV: other psychosocial or environmental problems and problems related to social environment Axis V: 41-50 serious symptoms  Past Medical History:  Past Medical History  Diagnosis Date  . Thrombocytopenia   . Cirrhosis   .  Hepatitis C   . Hypertension   . Anxiety   . Anemia   . Blood transfusion   . Obesity     Past Surgical History  Procedure Date  . Tonsillectomy     Family History:  Family History  Problem Relation Age of Onset  . Cancer - Other Mother     Uterine  . Coronary artery disease Father   . Coronary artery disease Paternal Grandfather   . Hypertension Father   . Hypertension Paternal Grandfather     Social History:  reports that he quit smoking about 19 months ago. His smoking use included Cigarettes. He does not have any smokeless tobacco history on file. He reports that he does not drink alcohol or use illicit drugs.  Additional Social History:     CIWA: CIWA-Ar BP: 128/83 mmHg Pulse Rate: 72  COWS:    Allergies: No Known Allergies  Home Medications:  (Not in a hospital admission)  OB/GYN Status:  No LMP for male patient.  General Assessment Data Location of Assessment: [facility] Living Arrangements: Spouse/significant other Can pt return to current living arrangement?: Yes Admission Status: Voluntary Is patient capable of signing voluntary admission?: Yes Transfer from: Acute Hospital Referral Source: MD  Education Status Is patient currently in school?: No  Risk  to self Suicidal Ideation: No-Not Currently/Within Last 6 Months Suicidal Intent: No-Not Currently/Within Last 6 Months Is patient at risk for suicide?: No Suicidal Plan?: No-Not Currently/Within Last 6 Months Access to Means: Yes Specify Access to Suicidal Means: has medications What has been your use of drugs/alcohol within the last 12 months?: alcoholic - sober since 02-15-2010 Previous Attempts/Gestures: No How many times?: 0  Other Self Harm Risks: none Triggers for Past Attempts: None known Intentional Self Injurious Behavior: None Family Suicide History: No Recent stressful life event(s): Other (Comment) (depressed) Persecutory voices/beliefs?: No Depression: Yes Depression Symptoms:  Tearfulness;Isolating;Fatigue;Guilt;Loss of interest in usual pleasures;Feeling worthless/self pity Substance abuse history and/or treatment for substance abuse?: No Suicide prevention information given to non-admitted patients: Yes  Risk to Others Homicidal Ideation: No Thoughts of Harm to Others: No Current Homicidal Intent: No Current Homicidal Plan: No Access to Homicidal Means: No Identified Victim: none History of harm to others?: No Assessment of Violence: None Noted Violent Behavior Description: cooperative Does patient have access to weapons?: No Criminal Charges Pending?: No Does patient have a court date: No  Psychosis Hallucinations: None noted Delusions: None noted  Mental Status Report Appear/Hygiene:  (appropriate) Eye Contact: Good Motor Activity: Unremarkable Speech: Soft;Slow;Logical/coherent Level of Consciousness: Alert Mood: Depressed;Anxious;Sad;Ashamed/humiliated;Worthless, low self-esteem Affect: Anxious;Depressed;Sad Anxiety Level: Severe Thought Processes: Coherent Judgement: Unimpaired Orientation: Person;Place;Situation Obsessive Compulsive Thoughts/Behaviors: None  Cognitive Functioning Concentration: Decreased Memory: Remote Intact;Recent Impaired IQ: Average Insight: Fair Impulse Control: Fair Appetite: Fair Weight Loss: 0  Weight Gain: 0  Sleep: Decreased Total Hours of Sleep: 3  Vegetative Symptoms: None  ADLScreening [facility] Assessment Services) Patient's cognitive ability adequate to safely complete daily activities?: Yes Patient able to express need for assistance with ADLs?: Yes Independently performs ADLs?: Yes (appropriate for developmental age)  Abuse/Neglect [facility]) Physical Abuse: Denies Verbal Abuse: Denies Sexual Abuse: Denies  Prior Inpatient Therapy Prior Inpatient Therapy: No  Prior Outpatient Therapy Prior Outpatient Therapy: Yes Prior Therapy Dates: current Prior Therapy Facilty/Provider(s): Dr. Evelene Croon Reason  for Treatment: depression  ADL Screening (condition at time of admission) Patient's cognitive ability adequate to safely complete daily activities?: Yes Patient able to express need for assistance with ADLs?: Yes Independently performs ADLs?: Yes (appropriate for developmental age) Weakness of Legs: None Weakness of Arms/Hands: None  Home Assistive Devices/Equipment Home Assistive Devices/Equipment: None  Therapy Consults (therapy consults require a physician order) PT Evaluation Needed: No OT Evalulation Needed: No SLP Evaluation Needed: No Abuse/Neglect Assessment (Assessment to be complete while patient is alone) Physical Abuse: Denies Verbal Abuse: Denies Sexual Abuse: Denies Exploitation of patient/patient's resources: Denies Self-Neglect: Denies Values / Beliefs Cultural Requests During Hospitalization: None Spiritual Requests During Hospitalization: None Consults Spiritual Care Consult Needed: No Social Work Consult Needed: No Merchant navy officer (For Healthcare) Advance Directive: Patient does not have advance directive Pre-existing out of facility DNR order (yellow form or pink MOST form): No Nutrition Screen- [facility] Patient's home diet: Regular Have you recently lost weight without trying?: No Have you been eating poorly because of a decreased appetite?: No Malnutrition Screening Tool Score: 0   Additional Information 1:1 In Past 12 Months?: No CIRT Risk: No Elopement Risk: No Does patient have medical clearance?: Yes     Disposition:   Pt may be discharged to home.  However, Tele Psych will have the final recommendation along with the ED MD based on their evaluation.  Efforts to reach Clydie Braun, the pt's wife, were unsuccessful.   Disposition Disposition of Patient: Referred to (outpatient pending  Tele Psych recommendations) Patient referred to: Outpatient clinic referral (dr. Evelene Croon and [facility])  On Site Evaluation by:   Reviewed with Physician:      Macon Large 06/12/2012 12:24 PM

## 2012-06-12 NOTE — ED Notes (Signed)
Telepsych faxed and called.   

## 2012-06-12 NOTE — ED Notes (Signed)
Poison control called back for follow-up. Stated that people who OD on Wellbutrin can have grand mal seizures as long as 24h out. Also, recommended a repeat tylenol level.

## 2012-06-12 NOTE — ED Notes (Signed)
Attempted to call pt's wife, Christ Fullenwider. Again reached v-mail at both numbers. No message left.

## 2012-06-12 NOTE — ED Notes (Signed)
telepsych completed 

## 2012-06-12 NOTE — ED Provider Notes (Signed)
Patient was evaluated by psychiatry. They do not feel pt is psychiatrically stable. Recommending inpatient hospitalization. Medications were ordered per their recommendations.  Raeford Razor, MD 06/12/12 1204

## 2012-06-12 NOTE — ED Notes (Signed)
Pt's wife called to get updates on pt. Left her contact number: [phone] ( karen).

## 2012-06-12 NOTE — ED Notes (Addendum)
Pt stating that he did not drink antifreeze. He states that he took 20-30 Wellbutrin.

## 2012-06-12 NOTE — ED Notes (Signed)
Pt c/o major depression that is ongoing. Pt states he is suicidal w/ plan to drink antifreeze. Pt is very difficult to understand when speaking.

## 2012-06-12 NOTE — ED Notes (Signed)
At pt's request, attempted to call his wife and let her know he is here. Attempted the number we have on file and the number provided by the pt. Both went to v-mail. No message left.

## 2012-06-12 NOTE — ED Notes (Signed)
Pt received from main ED into rm 27. Alert and oriented with slurred speech; but able to understand. Resting at the moment.will cont to care for pt. Vw, rn

## 2012-06-12 NOTE — ED Notes (Signed)
tonya from poison control called to check patient status.

## 2012-06-13 ENCOUNTER — Inpatient Hospital Stay (HOSPITAL_COMMUNITY): Admission: AD | Admit: 2012-06-13 | Payer: Medicare Other | Source: Intra-hospital | Admitting: Psychiatry

## 2012-06-13 ENCOUNTER — Inpatient Hospital Stay (HOSPITAL_COMMUNITY)
Admission: EM | Admit: 2012-06-13 | Discharge: 2012-06-20 | DRG: 885 | Disposition: A | Discharge status: Home / Self Care | Payer: Medicare Other | Source: Intra-hospital | Attending: Psychiatry | Admitting: Psychiatry

## 2012-06-13 ENCOUNTER — Encounter (HOSPITAL_COMMUNITY): Payer: Self-pay | Admitting: *Deleted

## 2012-06-13 DIAGNOSIS — F1020 Alcohol dependence, uncomplicated: Secondary | ICD-10-CM

## 2012-06-13 DIAGNOSIS — R45851 Suicidal ideations: Secondary | ICD-10-CM | POA: Diagnosis not present

## 2012-06-13 DIAGNOSIS — I10 Essential (primary) hypertension: Secondary | ICD-10-CM | POA: Diagnosis present

## 2012-06-13 DIAGNOSIS — F1110 Opioid abuse, uncomplicated: Secondary | ICD-10-CM | POA: Diagnosis present

## 2012-06-13 DIAGNOSIS — F1920 Other psychoactive substance dependence, uncomplicated: Secondary | ICD-10-CM

## 2012-06-13 DIAGNOSIS — F329 Major depressive disorder, single episode, unspecified: Secondary | ICD-10-CM | POA: Diagnosis present

## 2012-06-13 DIAGNOSIS — F1410 Cocaine abuse, uncomplicated: Secondary | ICD-10-CM

## 2012-06-13 DIAGNOSIS — Z79899 Other long term (current) drug therapy: Secondary | ICD-10-CM

## 2012-06-13 MED ORDER — ADULT MULTIVITAMIN W/MINERALS CH
1.0000 | ORAL_TABLET | Freq: Every morning | ORAL | Status: DC
  Administered 2012-06-14: 1 via ORAL
  Filled 2012-06-13 (×2): qty 1

## 2012-06-13 MED ORDER — HYDROXYZINE HCL 50 MG PO TABS
50.0000 mg | ORAL_TABLET | Freq: Every evening | ORAL | Status: DC | PRN
  Administered 2012-06-13 – 2012-06-19 (×7): 50 mg via ORAL
  Filled 2012-06-13 (×15): qty 1

## 2012-06-13 MED ORDER — ACETAMINOPHEN 325 MG PO TABS: 650.0000 mg | ORAL_TABLET | Freq: Four times a day (QID) | ORAL | Status: DC | PRN

## 2012-06-13 MED ORDER — METHOCARBAMOL 500 MG PO TABS: 500.0000 mg | ORAL_TABLET | Freq: Three times a day (TID) | ORAL | Status: AC | PRN

## 2012-06-13 MED ORDER — LOPERAMIDE HCL 2 MG PO CAPS: 2.0000 mg | ORAL_CAPSULE | ORAL | Status: AC | PRN

## 2012-06-13 MED ORDER — NAPROXEN 500 MG PO TABS: 500.0000 mg | ORAL_TABLET | Freq: Two times a day (BID) | ORAL | Status: AC | PRN

## 2012-06-13 MED ORDER — CHLORDIAZEPOXIDE HCL 25 MG PO CAPS: 25.0000 mg | ORAL_CAPSULE | Freq: Four times a day (QID) | ORAL | Status: DC | PRN

## 2012-06-13 MED ORDER — ONDANSETRON 4 MG PO TBDP
4.0000 mg | ORAL_TABLET | Freq: Four times a day (QID) | ORAL | Status: AC | PRN
  Administered 2012-06-16: 4 mg via ORAL

## 2012-06-13 MED ORDER — ALUM & MAG HYDROXIDE-SIMETH 200-200-20 MG/5ML PO SUSP: 30.0000 mL | ORAL | Status: DC | PRN

## 2012-06-13 MED ORDER — CLONIDINE HCL 0.1 MG PO TABS
0.1000 mg | ORAL_TABLET | Freq: Four times a day (QID) | ORAL | Status: AC
  Administered 2012-06-13 – 2012-06-15 (×5): 0.1 mg via ORAL
  Filled 2012-06-13 (×9): qty 1

## 2012-06-13 MED ORDER — FLUOXETINE HCL 20 MG PO CAPS
20.0000 mg | ORAL_CAPSULE | Freq: Every morning | ORAL | Status: DC
  Administered 2012-06-14: 20 mg via ORAL
  Filled 2012-06-13 (×2): qty 1

## 2012-06-13 MED ORDER — MAGNESIUM HYDROXIDE 400 MG/5ML PO SUSP: 30.0000 mL | Freq: Every day | ORAL | Status: DC | PRN

## 2012-06-13 MED ORDER — LACTULOSE 10 GM/15ML PO SOLN
20.0000 g | Freq: Every day | ORAL | Status: DC
  Administered 2012-06-16 – 2012-06-19 (×4): 20 g via ORAL
  Filled 2012-06-13 (×8): qty 30

## 2012-06-13 MED ORDER — LISINOPRIL-HYDROCHLOROTHIAZIDE 20-12.5 MG PO TABS: 1.0000 | ORAL_TABLET | Freq: Every morning | ORAL | Status: DC

## 2012-06-13 MED ORDER — DICYCLOMINE HCL 20 MG PO TABS: 20.0000 mg | ORAL_TABLET | Freq: Four times a day (QID) | ORAL | Status: AC | PRN

## 2012-06-13 MED ORDER — LISINOPRIL 20 MG PO TABS
20.0000 mg | ORAL_TABLET | Freq: Every day | ORAL | Status: DC
  Administered 2012-06-14 – 2012-06-20 (×7): 20 mg via ORAL
  Filled 2012-06-13 (×9): qty 1

## 2012-06-13 MED ORDER — CLONIDINE HCL 0.1 MG PO TABS
0.1000 mg | ORAL_TABLET | ORAL | Status: AC
  Administered 2012-06-16 – 2012-06-17 (×4): 0.1 mg via ORAL
  Filled 2012-06-13 (×4): qty 1

## 2012-06-13 MED ORDER — CLONIDINE HCL 0.1 MG PO TABS
0.1000 mg | ORAL_TABLET | Freq: Every day | ORAL | Status: AC
Start: 2012-06-18 — End: 2012-06-20
  Administered 2012-06-18: 0.1 mg via ORAL
  Filled 2012-06-13 (×2): qty 1

## 2012-06-13 MED ORDER — HYDROCHLOROTHIAZIDE 12.5 MG PO CAPS
12.5000 mg | ORAL_CAPSULE | Freq: Every day | ORAL | Status: DC
  Administered 2012-06-14 – 2012-06-20 (×7): 12.5 mg via ORAL
  Filled 2012-06-13 (×9): qty 1

## 2012-06-13 MED ORDER — HYDROXYZINE HCL 25 MG PO TABS
25.0000 mg | ORAL_TABLET | Freq: Four times a day (QID) | ORAL | Status: AC | PRN
  Administered 2012-06-16: 25 mg via ORAL

## 2012-06-13 MED ORDER — NICOTINE 21 MG/24HR TD PT24
21.0000 mg | MEDICATED_PATCH | Freq: Every day | TRANSDERMAL | Status: DC
  Filled 2012-06-13: qty 1

## 2012-06-13 MED ORDER — CYCLOBENZAPRINE HCL 10 MG PO TABS: 10.0000 mg | ORAL_TABLET | Freq: Every day | ORAL | Status: DC

## 2012-06-13 NOTE — [facility] Counselor (Addendum)
69 [name] [name] at [facility] expects discharges later today. Faxed referral to [facility].   1/27 [phone] [name] [name] at [facility] - pt declined b/c of medical issues  Evette Cristal, [place] Assessment Counselor

## 2012-06-13 NOTE — ED Notes (Signed)
Patient discharge with security and NT with steady gait. NAD.

## 2012-06-13 NOTE — Progress Notes (Signed)
Pt confirms [employer] is insurer but does not have a pcp

## 2012-06-13 NOTE — Tx Team (Signed)
Initial Interdisciplinary Treatment Plan  PATIENT STRENGTHS: (choose at least two) Ability for insight Average or above average intelligence Capable of independent living General fund of knowledge Motivation for treatment/growth Supportive family/friends  PATIENT STRESSORS: Financial difficulties Health problems   PROBLEM LIST: Problem List/Patient Goals Date to be addressed Date deferred Reason deferred Estimated date of resolution  depression      Risk for self harm            Help me deal with my health issues                                     DISCHARGE CRITERIA:  Improved stabilization in mood, thinking, and/or behavior Medical problems require only outpatient monitoring Motivation to continue treatment in a less acute level of care Reduction of life-threatening or endangering symptoms to within safe limits Verbal commitment to aftercare and medication compliance Withdrawal symptoms are absent or subacute and managed without 24-hour nursing intervention  PRELIMINARY DISCHARGE PLAN: Outpatient therapy Participate in family therapy Return to previous living arrangement  PATIENT/FAMIILY INVOLVEMENT: This treatment plan has been presented to and reviewed with the patient, [name] [name], and/or family member.  The patient and family have been given the opportunity to ask questions and make suggestions.  Jesus Genera [facility] 06/13/2012, 11:55 PM

## 2012-06-13 NOTE — ED Notes (Addendum)
Patient is resting comfortably. Sitter at bedside.  

## 2012-06-13 NOTE — ED Provider Notes (Signed)
0715.  Pt is awake in bed, NAD.  Note stable vital signs.  No acte overnight events reported.  Labs reviewed.  Thrombocytopenia is not an acute finding.  Pt has been recommended for inpt treatment.  The ACT Team is working towards finding an appropriate bed assignment.  Tobin Chad, MD 06/13/12 [phone]

## 2012-06-14 ENCOUNTER — Encounter (HOSPITAL_COMMUNITY): Payer: Self-pay | Admitting: Psychiatry

## 2012-06-14 DIAGNOSIS — F1110 Opioid abuse, uncomplicated: Secondary | ICD-10-CM | POA: Diagnosis present

## 2012-06-14 DIAGNOSIS — F329 Major depressive disorder, single episode, unspecified: Secondary | ICD-10-CM | POA: Diagnosis present

## 2012-06-14 DIAGNOSIS — F1920 Other psychoactive substance dependence, uncomplicated: Secondary | ICD-10-CM | POA: Diagnosis present

## 2012-06-14 DIAGNOSIS — F332 Major depressive disorder, recurrent severe without psychotic features: Secondary | ICD-10-CM

## 2012-06-14 DIAGNOSIS — F411 Generalized anxiety disorder: Secondary | ICD-10-CM

## 2012-06-14 DIAGNOSIS — F1020 Alcohol dependence, uncomplicated: Secondary | ICD-10-CM | POA: Diagnosis present

## 2012-06-14 DIAGNOSIS — F1410 Cocaine abuse, uncomplicated: Secondary | ICD-10-CM | POA: Diagnosis present

## 2012-06-14 DIAGNOSIS — F1021 Alcohol dependence, in remission: Secondary | ICD-10-CM

## 2012-06-14 MED ORDER — ADULT MULTIVITAMIN W/MINERALS CH
1.0000 | ORAL_TABLET | Freq: Every day | ORAL | Status: DC
  Administered 2012-06-15 – 2012-06-20 (×6): 1 via ORAL
  Filled 2012-06-14 (×7): qty 1

## 2012-06-14 MED ORDER — FLUOXETINE HCL 20 MG PO CAPS
20.0000 mg | ORAL_CAPSULE | Freq: Every day | ORAL | Status: DC
  Administered 2012-06-15 – 2012-06-20 (×6): 20 mg via ORAL
  Filled 2012-06-14 (×8): qty 1

## 2012-06-14 NOTE — Progress Notes (Signed)
[facility] Group Notes:  (Counselor/Nursing/MHT/Case Management/Adjunct)  Type of Therapy:  Psychoeducational Skills  Participation Level:  Minimal  Summary of Progress/Problems: [name] [name] attended psychoeducational group that focused on using quality time with support systems/individuals to engage in health coping skills but was called out for a consult.  [name] [name] 06/14/2012 1:02 PM

## 2012-06-14 NOTE — Progress Notes (Signed)
Vol admit for depression and SI after overdosing with 20-30 Wellbutrin 100mg  tabs in a suicide attempt.  Pt took the pills, then drove himself to the [facility].  Pt told them that he had a plan to drink antifreeze, but he did not actually do it.  Pt reports he has become increasingly depressed over the last couple of years.  "I have some medical issues and memory problems.  I'm unemployed and I thought my wife would be better off without me."  He says he is now ashamed that he did it and now denies suicidal thoughts.  He contracts for safety.  Pt has various bruising to his arms from IV sticks at the ED.  Medical hx includes cirrhosis, Hep C, HTN, anemia.  He states that he has a hx of alcohol use, but has been sober since 2011.  He also quit smoking in 2005.  He says his wife is supportive. He appears flat/depressed.  His speech is clear/logical.  Pt was pleasant/cooperative with the admission process.  Pt had no complaints at this time.  Pt briefly oriented to the unit/room.  Safety checks q15 minutes initiated.

## 2012-06-14 NOTE — [facility] Suicide Risk Assessment (Signed)
Suicide Risk Assessment  Admission Assessment     Nursing information obtained from:  Patient Demographic factors:  Male;Caucasian;Unemployed Current Mental Status:   (Pt denies at this time) Loss Factors:  Decrease in vocational status;Decline in physical health;Financial problems / change in socioeconomic status Historical Factors:  Prior suicide attempts Risk Reduction Factors:  Living with another person, especially a relative;Positive social support  CLINICAL FACTORS:   Depression:   Anhedonia Comorbid alcohol abuse/dependence Hopelessness Impulsivity Severe Alcohol/Substance Abuse/Dependencies  COGNITIVE FEATURES THAT CONTRIBUTE TO RISK:  Closed-mindedness    SUICIDE RISK:   Moderate:  Frequent suicidal ideation with limited intensity, and duration, some specificity in terms of plans, no associated intent, good self-control, limited dysphoria/symptomatology, some risk factors present, and identifiable protective factors, including available and accessible social support.  PLAN OF CARE: Detox/relapse prevention                              Reassess the co morbidities                              Reassess medications  I certify that inpatient services furnished can reasonably be expected to improve the patient's condition.  Hammond Obeirne A 06/14/2012, 4:44 PM

## 2012-06-14 NOTE — Progress Notes (Addendum)
D:  Patient's self inventory sheet, patient needs sleep medication, has improving appetite, low energy level, good attention span.  Rated depression #4, hopelessness #7.  Has felt agitated today.  Rated anxiety #8.  Denied SI.  No physical problems.  No mental pain, worst pain #5.  After discharge, plans to go for walks, not pop a pill for anxiety.  No questions for staff.  No discharge plans.  No problems taking meds after discharge. A:  Medications administered per MD order.  Support and encouragement given throughout day.  Support and safety checks completed as ordered. R:  Following treatment plan.  Denied SI and HI.  Denied A/V hallucinations.  Contracts for safety.  Patient remains safe and receptive on unit. Stated he talked to his wife this afternoon, has supportive family and is feeling much better about things now.
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
## 2012-06-14 NOTE — Progress Notes (Signed)
D:  Patient's self inventory sheet, patient needs sleep meds, has improving appetite, normal energy level, good attention span.  Rated depression, anxiety  and hopelessness #8.  Has experienced diarrhea in past 24 hours.  SI off/on, no plan, contracts for safety.  Has experienced headache, pain, dizziness in past 24 hours.  After discharge, will have positive thinking.  No questions for staff.  [facility] go to [facility] or [facility] after discharge.  No discharge plans.  No problems taking meds after discharge. A:   Medications administered per MD order.  Support and encouragement given throughout day.  Support and safety checks completed as ordered. R:  Following treatment plan.  Denied HI.  Denied A/V hallucinations  SI off/on, contracts for safety.  Patient remains safe and receptive on unit.

## 2012-06-14 NOTE — Clinical Social Work Note (Signed)
[facility] LCSW Group Therapy  06/14/2012  1:15 PM   Type of Therapy:  Group Therapy  Participation Level:  Active  Participation Quality:  Appropriate and Attentive  Affect:  Appropriate  Cognitive:  Alert and Appropriate  Insight:  Developing/Improving  Engagement in Therapy:  Developing/Improving  Modes of Intervention:  Activity, Clarification, Discussion, Education, Socialization and Support  Summary of Progress/Problems: Patient was attentive and engaged with speaker from [employer].  Patient expressed interest in their programs and services.  Patient processed ways they can relate to the speaker.   Writer provided suicide prevention education directly to patients; conversation included risk factors, warning signs and resources to contact for help. Mobile crisis services explained and contact info placed in chart for pt to receive at discharge.   Anushka Hartinger, LCSWA 06/14/2012 1:49 PM   
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
## 2012-06-15 DIAGNOSIS — F1920 Other psychoactive substance dependence, uncomplicated: Secondary | ICD-10-CM

## 2012-06-15 MED ORDER — ENSURE COMPLETE PO LIQD
237.0000 mL | Freq: Two times a day (BID) | ORAL | Status: DC
  Administered 2012-06-16 – 2012-06-20 (×6): 237 mL via ORAL

## 2012-06-15 NOTE — Progress Notes (Signed)
Pt reports he is "hanging in there".  He appears flat/depressed.  He reports he has been going to groups.  He says he keeps thinking about his life situation and has suicidal thoughts which come and go.  He say he probably can't go back to his wife and is not sure what his plans are.  He denies HI/AV.  He denies withdrawal symptoms at this time.  He has not taken any clonidine today.  He has been observed in the dayroom talking to some of his peers.  Pt is pleasant/cooperative with staff on the unit.  Continue to encourage pt to make his needs known to staff.  Pt voices no needs/concerns at this time.  Safety maintained with q15 checks.

## 2012-06-15 NOTE — Progress Notes (Signed)
Adult Psychoeducational Group Note  Date:  06/15/2012 Time:  1:46 PM  Group Topic/Focus:  Personal Choices and Values:   The focus of this group is to help patients assess and explore the importance of values in their lives, how their values affect their decisions, how they express their values and what opposes their expression.  Participation Level:  Active  Participation Quality:  Appropriate, Sharing and Supportive  Affect:  Appropriate  Cognitive:  Appropriate  Insight: Appropriate  Engagement in Group:  Supportive  Modes of Intervention:  Support  Additional Comments:  Pt attend group.  Isla Pence M 06/15/2012, 1:46 PM

## 2012-06-15 NOTE — Progress Notes (Signed)
Pt still feeling sad and depressed.  He rated his depression a 7 and both his hopelessness and anxiety a 8 on his self-inventory.  He is having some self-harm thoughts off and on he has no plan here in hospital and does contract to come to staff.  He has refused all 3 doses of his clonidine and he stated,"I am not having any symptoms of withdrawal but I do feel very depressed"  Pt is unsure of his f/u at this time.  Pt was placed on ensure per the dietician and pt refused the first can/bottle but felt he would take the one later tonight.

## 2012-06-15 NOTE — Tx Team (Signed)
Interdisciplinary Treatment Plan Update (Adult)  Date: 06/15/2012  Time Reviewed: 9:45 AM   Progress in Treatment: Attending groups: Yes Participating in groups: Yes Taking medication as prescribed:  Yes Tolerating medication:  Yes Family/Significant othe contact made: Not as yet Patient understands diagnosis: Yes Discussing patient identified problems/goals with staff: Yes Medical problems stabilized or resolved:  Yes Denies suicidal/homicidal ideation: Yes Issues/concerns per patient self-inventory:  Inventory not available Other: N/A  New problem(s) identified: None Identified  Reason for Continuation of Hospitalization: Medication stabilization Suicidal ideation Withdrawal symptoms  Interventions implemented related to continuation of hospitalization: mood stabilization, medication monitoring and adjustment, group therapy and psycho education, suicide risk assessment, collateral contact, aftercare planning, ongoing physician assessments and safety checks q 15 mins  Additional comments: N/A  Estimated length of stay: 4-5 days  Discharge Plan:  CSW is assessing for appropriate referrals.   New goal(s): N/A  Review of initial/current patient goals per problem list:     1.  Goal: Patient will be able to identify effective and ineffective coping patterns  Met: No  Target Date: 3-5 days  As evidenced by: Patient unable to identify at this time due to limited insight and hopelessness 2. Goal(s): Address suicidal ideation   Met: No   Target date: 3-5 days   As evidenced by: Patient still feels hopeless and suicide prevention education not provided as yet 3. Goal (s): Reduce depressive symptoms from a 10 to a 3   Met: No   Target date: 3-5 days   As evidenced by: Pt rates at a 8 when asked 4. Complete Detox Protocol and Identify comprehensive mental wellness and sobriety plan  Met: No  Target Date: 3-5 days  As evidenced by: Completion of detox and followup plan in  place     Attendees: Patient:    Family:    Physician:  Geoffery Lyons   06/15/2012 9:45 AM  Nursing:        06/15/2012 9:45 AM  Clinical Social Worker Ronda Fairly 06/15/2012 9:45 AM  Other:  Kae Heller, PA Intern   06/15/2012 9:45 AM    Scribe for Treatment Team:   Carney Bern, LCSWA  06/15/2012 9:45 AM

## 2012-06-15 NOTE — Progress Notes (Signed)
Adult Psychoeducational Group Note  Date:  06/15/2012 Time:  10:21 PM  Group Topic/Focus:  N/A group  Participation Level:  Active  Participation Quality:  Appropriate  Affect:  Appropriate  Cognitive:  Appropriate  Insight: Appropriate  Engagement in Group:  Engaged  Modes of Intervention:  Education and Support  Additional Comments:  Pt attended group; seemed engaged with the speakers holding the group   Tomi Bamberger Coursey 06/15/2012, 10:21 PM

## 2012-06-15 NOTE — Progress Notes (Signed)
Nutrition Brief Note  Patient identified on the Malnutrition Screening Tool (MST) Report  There is no height or weight on file to calculate BMI.   Pt reports poor appetite with 40 pound unintended weight loss in the past year. Pt reports he has recently just been eating a lot of junk food because he doesn't feel like cooking. Pt reports his intake at home is likely only 25% of what he needs to be eating. Pt reports he is still not eating well during admission and is interested in getting Ensure, will order. Recommend MD consider appetite stimulant because of pt's prolonged poor appetite.   No further nutrition interventions warranted at this time. If nutrition issues arise, please consult RD.   Levon Hedger MS, RD, LDN [phone] Pager [phone] After Hours Pager

## 2012-06-15 NOTE — Progress Notes (Signed)
[name] MD Progress Note  06/15/2012 6:11 PM Krzysztof Reichelt  MRN:  [id number] Subjective:  Feeling very depressed. He is probably not going to be able to go back with his wife. He admits that he has not been doing well for a while now. His relapse although he sees it as a consequences of his behavior, threw everything off. Still with a sense of hopelessness helplessness Diagnosis:   Axis I: Major depression recurrent, severe Polysubstance Dependence including Opioids Axis II: Deferred Axis III:  Past Medical History  Diagnosis Date  . Thrombocytopenia   . Cirrhosis   . Hepatitis C   . Hypertension   . Anxiety   . Anemia   . Blood transfusion   . Obesity    Axis IV: other psychosocial or environmental problems and problems with primary support group Axis V: 51-60 moderate symptoms  ADL's:  Intact  Sleep: Fair  Appetite:  Poor  Suicidal Ideation:  Plan:  ideas, no plans Intent:  denies Means:  denies Homicidal Ideation:  Plan:  denies Intent:  denies Means:  denies AEB (as evidenced by):  Psychiatric Specialty Exam: Review of Systems  Constitutional: Positive for malaise/fatigue.  HENT: Negative.   Eyes: Negative.   Respiratory: Negative.   Cardiovascular: Negative.   Gastrointestinal: Negative.   Genitourinary: Negative.   Musculoskeletal: Negative.   Skin: Negative.   Neurological: Positive for weakness.  Endo/Heme/Allergies: Negative.   Psychiatric/Behavioral: Positive for depression, suicidal ideas and substance abuse. The patient is nervous/anxious.     Blood pressure 130/78, pulse 75, temperature 98.5 F (36.9 C), temperature source Oral, resp. rate 16, SpO2 96.00%.There is no height or weight on file to calculate BMI.  General Appearance: Disheveled  Eye Contact::  Minimal  Speech:  Clear and Coherent, Slow and not spontaneous  Volume:  Decreased  Mood:  Depressed, hopeless, worthless  Affect:  Depressed  Thought Process:  Coherent and Goal Directed   Orientation:  Full (Time, Place, and Person)  Thought Content:  Rumination and worries, concerns  Suicidal Thoughts:  Yes.  without intent/plan  Homicidal Thoughts:  No  Memory:  Immediate;   Fair Recent;   Fair Remote;   Fair  Judgement:  Fair  Insight:  Present  Psychomotor Activity:  Decreased  Concentration:  Fair  Recall:  Fair  Akathisia:  No  Handed:  Right  AIMS (if indicated):     Assets:  Desire for Improvement  Sleep:  Number of Hours: 6.75    Current Medications: Current Facility-Administered Medications  Medication Dose Route Frequency Provider Last Rate Last Dose  . acetaminophen (TYLENOL) tablet 650 mg  650 mg Oral Q6H PRN Kerry Hough, PA      . alum & mag hydroxide-simeth (MAALOX/MYLANTA) 200-200-20 MG/5ML suspension 30 mL  30 mL Oral Q4H PRN Kerry Hough, PA      . chlordiazePOXIDE (LIBRIUM) capsule 25 mg  25 mg Oral QID PRN Kerry Hough, PA      . cloNIDine (CATAPRES) tablet 0.1 mg  0.1 mg Oral QID Kerry Hough, PA   0.1 mg at 06/14/12 2214   Followed by  . cloNIDine (CATAPRES) tablet 0.1 mg  0.1 mg Oral BH-qamhs Kerry Hough, PA       Followed by  . cloNIDine (CATAPRES) tablet 0.1 mg  0.1 mg Oral QAC breakfast Kerry Hough, PA      . dicyclomine (BENTYL) tablet 20 mg  20 mg Oral Q6H PRN Kerry Hough, PA      .  feeding supplement (ENSURE COMPLETE) liquid 237 mL  237 mL Oral BID BM Lavena Bullion, RD      . FLUoxetine (PROZAC) capsule 20 mg  20 mg Oral Daily Rachael Fee, MD   20 mg at 06/15/12 1610  . lisinopril (PRINIVIL,ZESTRIL) tablet 20 mg  20 mg Oral Daily Rachael Fee, MD   20 mg at 06/15/12 9604   And  . hydrochlorothiazide (MICROZIDE) capsule 12.5 mg  12.5 mg Oral Daily Rachael Fee, MD   12.5 mg at 06/15/12 5409  . hydrOXYzine (ATARAX/VISTARIL) tablet 25 mg  25 mg Oral Q6H PRN Kerry Hough, PA      . hydrOXYzine (ATARAX/VISTARIL) tablet 50 mg  50 mg Oral QHS,MR X 1 Kerry Hough, PA   50 mg at 06/14/12 2214  . lactulose  (CHRONULAC) 10 GM/15ML solution 20 g  20 g Oral QHS Kerry Hough, PA      . loperamide (IMODIUM) capsule 2-4 mg  2-4 mg Oral PRN Kerry Hough, PA      . magnesium hydroxide (MILK OF MAGNESIA) suspension 30 mL  30 mL Oral Daily PRN Kerry Hough, PA      . methocarbamol (ROBAXIN) tablet 500 mg  500 mg Oral Q8H PRN Kerry Hough, PA      . multivitamin with minerals tablet 1 tablet  1 tablet Oral Daily Rachael Fee, MD   1 tablet at 06/15/12 8119  . naproxen (NAPROSYN) tablet 500 mg  500 mg Oral BID PRN Kerry Hough, PA      . ondansetron (ZOFRAN-ODT) disintegrating tablet 4 mg  4 mg Oral Q6H PRN Kerry Hough, PA        Lab Results: No results found for this or any previous visit (from the past 48 hour(s)).  Physical Findings: AIMS: Facial and Oral Movements Muscles of Facial Expression: None, normal Lips and Perioral Area: None, normal Jaw: None, normal Tongue: None, normal,Extremity Movements Upper (arms, wrists, hands, fingers): None, normal Lower (legs, knees, ankles, toes): None, normal, Trunk Movements Neck, shoulders, hips: None, normal, Overall Severity Severity of abnormal movements (highest score from questions above): None, normal Incapacitation due to abnormal movements: None, normal Patient's awareness of abnormal movements (rate only patient's report): No Awareness, Dental Status Current problems with teeth and/or dentures?: No Does patient usually wear dentures?: No  CIWA:  CIWA-Ar Total: 2  COWS:  COWS Total Score: 0   Treatment Plan Summary: Daily contact with patient to assess and evaluate symptoms and progress in treatment Medication management  Plan: Supportive approach/coping skills/relapse prevention           Continue Detox protocols           Reassess co morbidities           Explore rehab options  Medical Decision Making Problem Points:  Established problem, worsening (2), Review of last therapy session (1) and Review of psycho-social  stressors (1) Data Points:  Review of medication regiment & side effects (2) Review of new medications or change in dosage (2)  I certify that inpatient services furnished can reasonably be expected to improve the patient's condition.   Arliene Rosenow A 06/15/2012, 6:11 PM

## 2012-06-15 NOTE — [facility] Counselor (Signed)
Adult Comprehensive Assessment  Patient ID: [name] [name], male   DOB: [birth year], 52 y.o.   MRN: [id number]  Information Source: Information source: Patient  Current Stressors:  Educational / Learning stressors: 10th grade education Employment / Job issues: Disabled Family Relationships: Clinical cytogeneticist / Lack of resources (include bankruptcy): Difficult due to patient's drug use Housing / Lack of housing: NA Physical health (include injuries & life threatening diseases): Liver damage, pancreas damage Social relationships: Isolate, "ran my friends off" Substance abuse: Ongoing Bereavement / Loss: Mother died when patient was 50 and he has never grieved her loss, father died more recently  Living/Environment/Situation:  Living Arrangements: Spouse/significant other How long has patient lived in current situation?: 10 years What is atmosphere in current home: Chaotic  Family History:  Marital status: Married Number of Years Married: 10  What types of issues is patient dealing with in the relationship?: Patient's drug use and disability Additional relationship information: "I was never honest with her from the beginning when she asked about things like drug use, history and prison time." Does patient have children?: No  Childhood History:  By whom was/is the patient raised?: Both parents Additional childhood history information: Mother spent most of her time in bed for las four years of her life and died when pt was 50 Description of patient's relationship with caregiver when they were a child: Good as a young kid, became difficult with mother's depression; never very close to father Patient's description of current relationship with people who raised him/her: Both deceased Does patient have siblings?: Yes Number of Siblings: 2  Description of patient's current relationship with siblings: No relationship with either of 2 brothers Did patient suffer any  verbal/emotional/physical/sexual abuse as a child?: No Did patient suffer from severe childhood neglect?: No Has patient ever been sexually abused/assaulted/raped as an adolescent or adult?: No Was the patient ever a victim of a crime or a disaster?: No Witnessed domestic violence?: No Has patient been effected by domestic violence as an adult?: No  Education:  Highest grade of school patient has completed: 10 th Currently a student?: No Learning disability?: No  Employment/Work Situation:   Employment situation: On disability Why is patient on disability: Depression, liver and pancreatic disease How long has patient been on disability: 2.5 years Patient's job has been impacted by current illness:  (NA) What is the longest time patient has a held a job?: 14 years Where was the patient employed at that time?: [employer] Has patient ever been in the [employer]?: No Has patient ever served in Buyer, retail?: No  Financial Resources:   Surveyor, quantity resources: [employer];Income from spouse Does patient have a representative payee or guardian?: No  Alcohol/Substance Abuse:   What has been your use of drugs/alcohol within the last 12 months?: Patient using 2 grams of cocaine per day IV for month or more, last use 10 days ago.  Patient using Hydrocodone for 2 months or more in AM and throughout the day If attempted suicide, did drugs/alcohol play a role in this?: Yes (Pt under influence of hydrocodone when he overdosed on wellbutrin) Alcohol/Substance Abuse Treatment Hx: Past Tx, Inpatient If yes, describe treatment: [facility] twice over the years Has alcohol/substance abuse ever caused legal problems?: Yes (1 Dui and three years for grand larceny)  Social Support System:   Patient's Community Support System: Poor Describe Community Support System: "No one is for me now" Type of faith/religion: Belief in God How does patient's faith help to cope with current illness?:  Sometimes it  helps  Leisure/Recreation:   Leisure and Hobbies: "Gave it all up"  Strengths/Needs:   What things does the patient do well?: "cannot do anything well now" In what areas does patient struggle / problems for patient: "substance abuse, depression and isolation"  Discharge Plan:   Does patient have access to transportation?: No Plan for no access to transportation at discharge: CSW will contact patient's family and see if they may be willing to help him get to treatment and will also contact [facility] which provides transportation Will patient be returning to same living situation after discharge?: No Plan for living situation after discharge: Patient uncertain if he can return home; plan is to go to treatment from here Currently receiving community mental health services: No If no, would patient like referral for services when discharged?: Yes (What county[place]) Does patient have financial barriers related to discharge medications?: No  Summary/Recommendations:   Summary and Recommendations (to be completed by the evaluator): Patient is 52 YO married disabled caucasian male admitted with diagnosis of Major Depression, Recurrent and severe.  Patient also has history and ongoing substance abuse which led to suicide attempt while under the influence.  Patient reports he may have severed all family ties with this instance. Patient would benefit from crisis stabilization, medication evaluation, therapy groups for processing thoughts/feelings/experiences, psycho ed groups for coping skills, and case management for discharge planning    [name]. 06/15/2012

## 2012-06-15 NOTE — Progress Notes (Signed)
Pt reports he has had fairly good day.  He spoke with his wife and feels somewhat hopeful about his future.  He is still anxious about their finances.  He has attended groups today.  He contracts for safety and denies HI/AV.  He reports no withdrawal symptoms.  At this point, he plans to return home at discharge.  Pt was encouraged to make his needs known to staff.  Pt voiced no needs/concerns at this time.  Safety maintained with q15 minute checks.

## 2012-06-15 NOTE — Progress Notes (Signed)
[facility] LCSW Group Therapy  06/15/2012 6:51 PM  Type of Therapy:  Group Therapy  Participation Level:  Active  Participation Quality:  Appropriate  Affect:  Depressed and Flat  Cognitive:  Oriented  Insight:  Limited  Engagement in Therapy:  Improving  Modes of Intervention:  Clarification, Exploration, Rapport Building and Support  Summary of Progress/Problems: Topic for today's group session was emotional regulation. Discussion evolved around anger, which arose as a difficult emotion for most patients to deal with, and the emotions that are often underlying anger. The group then focused on how best to cope with negative emotions. Underlying emotions present for [name] [name] include shame and embarrassment as "I am the joke of the neighborhood.  There goes [name] [name], he can't even stand up."  He was able to process that he often confuses guilt and shame and was open to remembering that guilt is about regret for past behavior and shame is about regret for who and what I am, thinking I am unworthy.  [name] [name] was very emotional during group.   [name] 06/15/2012, 6:46 PM

## 2012-06-15 NOTE — Progress Notes (Signed)
[facility] LCSW Aftercare Discharge Planning Group Note  06/15/2012 11:37 AM  Participation Quality:  Attentive and Sharing  Affect:  Depressed and Flat  Cognitive:  Oriented  Insight:  Limited  Engagement in Group:  Limited  Modes of Intervention:  Clarification, Exploration, Rapport Building and Socialization  Summary of Progress/Problems:  Patient shared that he slept well but still feeling overwhelming sense of guilt and shame.  Patient became emotional and shared he feels like he "needs to be somewhere for a long time." Patient has been involved in two [facility] programs in the past which were 11 to 14 months long.   [name] 06/15/2012, 11:37 AM

## 2012-06-16 MED ORDER — ENSURE PUDDING PO PUDG
1.0000 | Freq: Three times a day (TID) | ORAL | Status: DC
  Filled 2012-06-16 (×3): qty 1

## 2012-06-16 MED ORDER — ARIPIPRAZOLE 2 MG PO TABS
2.0000 mg | ORAL_TABLET | Freq: Every day | ORAL | Status: DC
  Administered 2012-06-16 – 2012-06-18 (×3): 2 mg via ORAL
  Filled 2012-06-16 (×7): qty 1

## 2012-06-16 MED ORDER — TRAZODONE HCL 50 MG PO TABS
50.0000 mg | ORAL_TABLET | Freq: Every evening | ORAL | Status: DC | PRN
  Administered 2012-06-16: 50 mg via ORAL
  Filled 2012-06-16: qty 1

## 2012-06-16 NOTE — Progress Notes (Signed)
[facility] LCSW Aftercare Discharge Planning Group Note  06/16/2012 4:41 PM  Participation Quality:  Sharing  Affect:  Depressed and Flat  Cognitive:  Oriented  Insight:  Lacking  Engagement in Group:  Limited  Modes of Intervention:  Exploration, Rapport Building, Dance movement psychotherapist and Support  Summary of Progress/Problems: Pt endorses suicidal ideation and agreed to contract for safety.  He denies homicidal ideation.  On a scale of 1 to 10 with ten being the most ever experienced, the patient rates depression at a 10 and anxiety at a 10. [name] [name] is experiencing withdrawal symptoms and feelings of shame and guilt around his marital relationship.     [name] 06/16/2012, 4:41 PM

## 2012-06-16 NOTE — Progress Notes (Signed)
Patient did attend the evening karaoke group. Pt was attentive and supportive.   

## 2012-06-16 NOTE — Progress Notes (Signed)
[facility] MD Progress Note  06/16/2012 12:30 PM [name] [name]  MRN:  [id number] Subjective:  Still feeling extremely depressed. Has a sense of hopelessness, helplessness. He realizes that it is going to take a long time to be able to mend things with his wife. States that he realizes a lot of damage has been done to the realtionship. He is aware the damage that he is doing to himself by using drugs.  Diagnosis:  Major Depression, Polysubstance Dependence  ADL's:  Intact  Sleep: Poor  Appetite:  Poor  Suicidal Ideation:  Plan:  ideas, no plans Intent:  denies Means:  denies Homicidal Ideation:  Plan:  Denies Intent:  Denies Means:  Denies AEB (as evidenced by):  Psychiatric Specialty Exam: Review of Systems  Constitutional: Positive for malaise/fatigue.  HENT: Negative.   Eyes: Negative.   Respiratory: Negative.   Cardiovascular: Negative.   Gastrointestinal: Negative.   Genitourinary: Negative.   Musculoskeletal: Negative.   Skin: Negative.   Neurological: Positive for weakness.  Endo/Heme/Allergies: Negative.   Psychiatric/Behavioral: Positive for depression, suicidal ideas and substance abuse. The patient is nervous/anxious and has insomnia.     Blood pressure 132/85, pulse 80, temperature 97.9 F (36.6 C), temperature source Oral, resp. rate 16, SpO2 96.00%.There is no height or weight on file to calculate BMI.  General Appearance: Fairly Groomed  Patent attorney::  Minimal  Speech:  Clear and Coherent, Slow and not spontaneous  Volume:  Decreased  Mood:  Depressed  Affect:  Depressed  Thought Process:  Coherent and Goal Directed  Orientation:  Full (Time, Place, and Person)  Thought Content:  worries, concerns  Suicidal Thoughts:  Yes.  without intent/plan  Homicidal Thoughts:  No  Memory:  Immediate;   Fair Recent;   Fair Remote;   Fair  Judgement:  Fair  Insight:  Present  Psychomotor Activity:  Decreased  Concentration:  Fair  Recall:  Fair  Akathisia:  No   Handed:  Right  AIMS (if indicated):     Assets:  Desire for Improvement  Sleep:  Number of Hours: 4.5    Current Medications: Current Facility-Administered Medications  Medication Dose Route Frequency Provider Last Rate Last Dose  . acetaminophen (TYLENOL) tablet 650 mg  650 mg Oral Q6H PRN Kerry Hough, PA      . alum & mag hydroxide-simeth (MAALOX/MYLANTA) 200-200-20 MG/5ML suspension 30 mL  30 mL Oral Q4H PRN Kerry Hough, PA      . ARIPiprazole (ABILIFY) tablet 2 mg  2 mg Oral Daily Rachael Fee, MD   2 mg at 06/16/12 1218  . chlordiazePOXIDE (LIBRIUM) capsule 25 mg  25 mg Oral QID PRN Kerry Hough, PA      . cloNIDine (CATAPRES) tablet 0.1 mg  0.1 mg Oral BH-qamhs Kerry Hough, PA   0.1 mg at 06/16/12 0809   Followed by  . cloNIDine (CATAPRES) tablet 0.1 mg  0.1 mg Oral QAC breakfast Kerry Hough, PA      . dicyclomine (BENTYL) tablet 20 mg  20 mg Oral Q6H PRN Kerry Hough, PA      . feeding supplement (ENSURE COMPLETE) liquid 237 mL  237 mL Oral BID BM Lavena Bullion, RD   237 mL at 06/16/12 1207  . FLUoxetine (PROZAC) capsule 20 mg  20 mg Oral Daily Rachael Fee, MD   20 mg at 06/16/12 0810  . lisinopril (PRINIVIL,ZESTRIL) tablet 20 mg  20 mg Oral Daily Rachael Fee, MD  20 mg at 06/16/12 0810   And  . hydrochlorothiazide (MICROZIDE) capsule 12.5 mg  12.5 mg Oral Daily Rachael Fee, MD   12.5 mg at 06/16/12 0810  . hydrOXYzine (ATARAX/VISTARIL) tablet 25 mg  25 mg Oral Q6H PRN Kerry Hough, PA   25 mg at 06/16/12 0981  . hydrOXYzine (ATARAX/VISTARIL) tablet 50 mg  50 mg Oral QHS,MR X 1 Kerry Hough, PA   50 mg at 06/15/12 2130  . lactulose (CHRONULAC) 10 GM/15ML solution 20 g  20 g Oral QHS Kerry Hough, PA      . loperamide (IMODIUM) capsule 2-4 mg  2-4 mg Oral PRN Kerry Hough, PA      . magnesium hydroxide (MILK OF MAGNESIA) suspension 30 mL  30 mL Oral Daily PRN Kerry Hough, PA      . methocarbamol (ROBAXIN) tablet 500 mg  500 mg Oral  Q8H PRN Kerry Hough, PA      . multivitamin with minerals tablet 1 tablet  1 tablet Oral Daily Rachael Fee, MD   1 tablet at 06/16/12 1914  . naproxen (NAPROSYN) tablet 500 mg  500 mg Oral BID PRN Kerry Hough, PA      . ondansetron (ZOFRAN-ODT) disintegrating tablet 4 mg  4 mg Oral Q6H PRN Kerry Hough, PA   4 mg at 06/16/12 0630  . traZODone (DESYREL) tablet 50 mg  50 mg Oral QHS PRN Rachael Fee, MD        Lab Results: No results found for this or any previous visit (from the past 48 hour(s)).  Physical Findings: AIMS: Facial and Oral Movements Muscles of Facial Expression: None, normal Lips and Perioral Area: None, normal Jaw: None, normal Tongue: None, normal,Extremity Movements Upper (arms, wrists, hands, fingers): None, normal Lower (legs, knees, ankles, toes): None, normal, Trunk Movements Neck, shoulders, hips: None, normal, Overall Severity Severity of abnormal movements (highest score from questions above): None, normal Incapacitation due to abnormal movements: None, normal Patient's awareness of abnormal movements (rate only patient's report): No Awareness, Dental Status Current problems with teeth and/or dentures?: No Does patient usually wear dentures?: No  CIWA:  CIWA-Ar Total: 0  COWS:  COWS Total Score: 0   Treatment Plan Summary: Daily contact with patient to assess and evaluate symptoms and progress in treatment Medication management  Plan: Supportive approach/coping skills/relapse prevention           Continue Detox           Continue Prozac 20 mg and add Abilify 2 mg daily             Medical Decision Making Problem Points:  Established problem, worsening (2) and Review of psycho-social stressors (1) Data Points:  Review of medication regiment & side effects (2) Review of new medications or change in dosage (2)  I certify that inpatient services furnished can reasonably be expected to improve the patient's condition.   Kaylaann Mountz  A 06/16/2012, 12:30 PM

## 2012-06-16 NOTE — Clinical Social Work Note (Signed)
[facility] LCSW Group Therapy  06/16/2012 2:41 PM  Type of Therapy:  Group Therapy  Participation Level:  Adequate  Participation Quality:  Attentive and Sharing  Affect:  Flat depressed  Cognitive:  Oriented  Insight:  Developing  Engagement in Therapy:  Limited  Modes of Intervention:  Exploration, Socialization and Support  Summary of Progress/Problems: Focus of group processing discussion was on balance in life; the components in life which have a negative influence on balance and the components which make for a more balanced life.  Patient shared that he feels totally out of balance, locked behind a padlocked door, isolating himself from others, especially his wife with whom he has many regrets  [name] [name] 06/16/2012, 2:41 PM

## 2012-06-16 NOTE — Progress Notes (Signed)
D. Pt in room this afternoon, did not go to or participate in rec therapy. Pt has endorsed feelings of depression and suicidal thoughts but able to contract for safety on the unit. Pt also has some complaints of anxiety today. Pt did attend evening group activity and did receive medications without incident and did not verbalize any complaints of pain. A. Support and encouragement provided. R. Will continue to monitor.

## 2012-06-17 MED ORDER — TRAZODONE HCL 100 MG PO TABS
100.0000 mg | ORAL_TABLET | Freq: Every evening | ORAL | Status: DC | PRN
  Administered 2012-06-17: 100 mg via ORAL
  Filled 2012-06-17 (×4): qty 1

## 2012-06-17 NOTE — Tx Team (Signed)
Interdisciplinary Treatment Plan Update (Adult)  Date: 06/17/2012  Time Reviewed: 10:10 AM  Progress in Treatment:  Attending groups: Yes Participating in groups: yes  Taking medication as prescribed: Yes  Tolerating medication: Yes  Family/Significant othe contact made: Not as yet Patient understands diagnosis: Yes  Discussing patient identified problems/goals with staff: Yes  Medical problems stabilized or resolved: Yes  Denies suicidal/homicidal ideation: Yes  Issues/concerns per patient self-inventory:  Sheet not available Other: N/A   New problem(s) identified: None Identified   Reason for Continuation of Hospitalization:  Medical Issues Medication stabilization Suicidal ideation Withdrawal symptoms   Interventions implemented related to continuation of hospitalization: mood stabilization, medication monitoring and adjustment, group therapy and psycho education, suicide risk assessment, collateral contact, aftercare planning, ongoing physician assessments and safety checks q 15 mins   Additional comments: N/A   Estimated length of stay: 5 days   Discharge Plan: CSW is assessing for appropriate referrals.   New goal(s): N/A   Review of initial/current patient goals per problem list:  1. Goal: Patient will be able to identify effective and ineffective coping patterns   Met: No   Target Date: Discharge  As evidenced by: Patient unable to identify tools at this point 2. Goal(s): Address suicidal ideation  Met: No  Target date: 4 days  As evidenced by: [name] [name] still experiencing SI 3. Goal (s): Reduce depressive symptoms from a 10 to a 3  Met: No  Target date: 4 - 5 days  As evidenced by: Pt rates at a 8 today 4. Complete Detox Protocol and Identify comprehensive mental wellness and sobriety plan  Met: No  Target date: Discharge As evidenced by: finishing protocol, self report, and with support of CSW accessing a follow up plan   Attendees:  Patient:    Family:     Physician: Geoffery Lyons  06/17/2012 10:10 AM   Nursing: Vira Blanco, RN  06/17/2012 10:10 AM   Clinical Social Worker Ronda Fairly  06/17/2012 10:10 AM   Other: Kae Heller, PA Student Intern  06/17/2012 10:10 AM   Other: Lucia Estelle, NP  06/17/2012 10:10 AM   Other:  06/17/2012 10:10 AM   Other:  06/17/2012 10:10 AM   Scribe for Treatment Team:  Carney Bern, LCSWA 06/17/2012 10:10 AM

## 2012-06-17 NOTE — Progress Notes (Signed)
Patient ID: [name] [name], male   DOB: [birth year], 52 y.o.   MRN: [id number] D: Pt presented with depressed mood and flat affect. Pt denies SI/HI/AVH and pain. Pt denies symptoms of withdrawals. Pt attended evening AA group and Interacted appropriately with peers. Cooperative with assessment. No acute distressed noted at this time.   A: Met with pt 1:1. Medications administered as prescribed. Writer encouraged pt to discuss feelings. Pt encouraged to come to staff with any question or concerns.  R: Patient remains safe. He is complaint with medications and group programming. Continue current POC.

## 2012-06-17 NOTE — Progress Notes (Signed)
D Amadou remains sad, depressed, endorsing a flat affect. HE is compliant with his meds, taking them as ordered. HE attends his groups and is actively engaged in his recovery as evidenced by paying attention in his groups, trying to process and learn to understand his illness .   A He completed his AM self inventory and on it he wrote he cont to have " off and on " SI, he rated his depression and hopelessness " 3/6" respectively and stated his DC plan is centered around " praying, attending meetings and changing friends".   R he cont to struggle with his depression and POC will cont to support his therapies and maintain therapeutic relationship.

## 2012-06-17 NOTE — Progress Notes (Signed)
Patient ID: [name] [name], male   DOB: Jul 22, 1960, 52 y.o.   MRN: [id number]   DAR- Update note-3 am-7am  D: Pt observed sleeping in bed with eyes closed. RR even and unlabored. No distress noted  .  A: Q 15 minute checks were done for safety.  R: safety maintained on unit.

## 2012-06-17 NOTE — Progress Notes (Signed)
Date: 06/17/2012  Time: 1015  Group Topic/Focus:  Identifying Needs: The focus of this group is to help patients identify their personal needs that have been historically problematic and identify healthy behaviors to address their needs.  Participation Level: Active  Participation Quality: Appropriate  Affect: Appropriate  Cognitive: Alert and Appropriate  Insight: Engaged  Engagement in Group: Engaged  Additional Comments:  [name] [name]   

## 2012-06-17 NOTE — Clinical Social Work Note (Addendum)
[facility] LCSW Group Therapy  06/17/2012  Type of Therapy: Group Therapy   Participation Level: Minimal  Participation Quality: Attentive   Affect: Flat  Cognitive: Alert and Oriented   Insight: Limited   Engagement in Therapy: Attentive  Modes of Intervention: Discussion, Education, Rapport Building, Socialization and Support   Summary of Progress/Problems: Topic for today's group was feelings about relapse. Group members were able to well identify with feelings leading up to relapse but unable to identify a point at which they could make a decision not to use. Discussion presented opening for CSW to provide education of Post Acute Withdrawal Syndrome. Rhylen shared he was glad he returned after speaking to Doctor as he had never heard about post acute withdrawal. It was discovered that he had first gone to his room after seeing doctor and then made decision to join group and that was a "decsion" that could be recognized by CSW as an example of change.   [name]  06/17/2012

## 2012-06-17 NOTE — Progress Notes (Signed)
[facility] LCSW Aftercare Discharge Planning Group Note  06/17/2012   Participation Quality:  Inattentive  Affect:  Depressed and Flat  Cognitive:  Oriented  Insight:  Limited  Engagement in Group:  Lacking  Modes of Intervention:  Exploration, Rapport Building and Support  Summary of Progress/Problems: [name] [name] shared that he "continues to have thoughts of SI, but today seems better than yesterday so far."   On a scale of 1 to 10 with ten being the most ever experienced, the patient rates depression at an 8 and anxiety at a 6 or 7.  [name] [name] remained quiet during remainder of group, his movements remain slow.    [name] 06/17/2012,

## 2012-06-17 NOTE — Progress Notes (Signed)
[facility] MD Progress Note  06/17/2012 1:46 PM [name] [name]  MRN:  [id number] Subjective:  [name] [name] endorses that he continues to have a hard time. He is still depressed, he is not sleeping well at night. He heard from his wife that she will be willing to be there for him once he takes care of himself and gets himself together. He is worried that he would not be able to make it if he was out there right now. Diagnosis:   Axis I: Major Depression, recurrent, Polysubstance Dependence Axis II: Deferred Axis III:  Past Medical History  Diagnosis Date  . Thrombocytopenia   . Cirrhosis   . Hepatitis C   . Hypertension   . Anxiety   . Anemia   . Blood transfusion   . Obesity    Axis IV: other psychosocial or environmental problems and problems with primary support group Axis V: 51-60 moderate symptoms  ADL's:  Intact  Sleep: Poor  Appetite:  Fair  Suicidal Ideation:  Plan:  ideas, no plans Intent:  denies Means:  denies Homicidal Ideation:  Plan:  denies Intent:  denies Means:  denies AEB (as evidenced by):  Psychiatric Specialty Exam: Review of Systems  Constitutional: Negative.   HENT: Negative.   Eyes: Negative.   Respiratory: Negative.   Cardiovascular: Negative.   Gastrointestinal: Negative.   Genitourinary: Negative.   Musculoskeletal: Negative.   Skin: Negative.   Neurological: Negative.   Endo/Heme/Allergies: Negative.   Psychiatric/Behavioral: Positive for depression, suicidal ideas and substance abuse. The patient is nervous/anxious and has insomnia.     Blood pressure 146/90, pulse 72, temperature 97.8 F (36.6 C), temperature source Oral, resp. rate 20, SpO2 96.00%.There is no height or weight on file to calculate BMI.  General Appearance: Fairly Groomed  Patent attorney::  Minimal  Speech:  Clear and Coherent, Slow and not spontaneous  Volume:  Decreased  Mood:  Depressed  Affect:  Restricted  Thought Process:  Coherent and Goal Directed  Orientation:  Full  (Time, Place, and Person)  Thought Content:  worries, ruminations, poor self esteem, a sense of hopelessness (better since he talk to his wife)  Suicidal Thoughts:  Yes.  without intent/plan  Homicidal Thoughts:  No  Memory:  Immediate;   Fair Recent;   Fair Remote;   Fair  Judgement:  Fair  Insight:  Present  Psychomotor Activity:  Psychomotor Retardation  Concentration:  Fair  Recall:  Fair  Akathisia:  No  Handed:  Right  AIMS (if indicated):     Assets:  Desire for Improvement  Sleep:  Number of Hours: 3    Current Medications: Current Facility-Administered Medications  Medication Dose Route Frequency Provider Last Rate Last Dose  . acetaminophen (TYLENOL) tablet 650 mg  650 mg Oral Q6H PRN Kerry Hough, PA      . alum & mag hydroxide-simeth (MAALOX/MYLANTA) 200-200-20 MG/5ML suspension 30 mL  30 mL Oral Q4H PRN Kerry Hough, PA      . ARIPiprazole (ABILIFY) tablet 2 mg  2 mg Oral Daily Rachael Fee, MD   2 mg at 06/17/12 7829  . chlordiazePOXIDE (LIBRIUM) capsule 25 mg  25 mg Oral QID PRN Kerry Hough, PA      . cloNIDine (CATAPRES) tablet 0.1 mg  0.1 mg Oral BH-qamhs Kerry Hough, PA   0.1 mg at 06/17/12 5621   Followed by  . cloNIDine (CATAPRES) tablet 0.1 mg  0.1 mg Oral QAC breakfast Kerry Hough, PA      .  dicyclomine (BENTYL) tablet 20 mg  20 mg Oral Q6H PRN Kerry Hough, PA      . feeding supplement (ENSURE COMPLETE) liquid 237 mL  237 mL Oral BID BM Lavena Bullion, RD   237 mL at 06/16/12 1445  . FLUoxetine (PROZAC) capsule 20 mg  20 mg Oral Daily Rachael Fee, MD   20 mg at 06/17/12 1610  . lisinopril (PRINIVIL,ZESTRIL) tablet 20 mg  20 mg Oral Daily Rachael Fee, MD   20 mg at 06/17/12 0827   And  . hydrochlorothiazide (MICROZIDE) capsule 12.5 mg  12.5 mg Oral Daily Rachael Fee, MD   12.5 mg at 06/17/12 9604  . hydrOXYzine (ATARAX/VISTARIL) tablet 25 mg  25 mg Oral Q6H PRN Kerry Hough, PA   25 mg at 06/16/12 5409  . hydrOXYzine  (ATARAX/VISTARIL) tablet 50 mg  50 mg Oral QHS,MR X 1 Kerry Hough, PA   50 mg at 06/16/12 2216  . lactulose (CHRONULAC) 10 GM/15ML solution 20 g  20 g Oral QHS Kerry Hough, PA   20 g at 06/16/12 2216  . loperamide (IMODIUM) capsule 2-4 mg  2-4 mg Oral PRN Kerry Hough, PA      . magnesium hydroxide (MILK OF MAGNESIA) suspension 30 mL  30 mL Oral Daily PRN Kerry Hough, PA      . methocarbamol (ROBAXIN) tablet 500 mg  500 mg Oral Q8H PRN Kerry Hough, PA      . multivitamin with minerals tablet 1 tablet  1 tablet Oral Daily Rachael Fee, MD   1 tablet at 06/17/12 8119  . naproxen (NAPROSYN) tablet 500 mg  500 mg Oral BID PRN Kerry Hough, PA      . ondansetron (ZOFRAN-ODT) disintegrating tablet 4 mg  4 mg Oral Q6H PRN Kerry Hough, PA   4 mg at 06/16/12 0630  . traZODone (DESYREL) tablet 100 mg  100 mg Oral QHS PRN Rachael Fee, MD        Lab Results: No results found for this or any previous visit (from the past 48 hour(s)).  Physical Findings: AIMS: Facial and Oral Movements Muscles of Facial Expression: None, normal Lips and Perioral Area: None, normal Jaw: None, normal Tongue: None, normal,Extremity Movements Upper (arms, wrists, hands, fingers): None, normal Lower (legs, knees, ankles, toes): None, normal, Trunk Movements Neck, shoulders, hips: None, normal, Overall Severity Severity of abnormal movements (highest score from questions above): None, normal Incapacitation due to abnormal movements: None, normal Patient's awareness of abnormal movements (rate only patient's report): No Awareness, Dental Status Current problems with teeth and/or dentures?: No Does patient usually wear dentures?: No  CIWA:  CIWA-Ar Total: 2  COWS:  COWS Total Score: 0   Treatment Plan Summary: Daily contact with patient to assess and evaluate symptoms and progress in treatment Medication management  Plan: Supportive approach/coping skills/relapse prevention            Continue Prozac 20 mg and Abilify 2 mg daily           Increase the Trazodone to 100 mg HS           Consider increasing the Abilify to 2 mg BID             Medical Decision Making Problem Points:  Review of last therapy session (1) and Review of psycho-social stressors (1) Data Points:  Review of medication regiment & side effects (2) Review of new medications  or change in dosage (2)  I certify that inpatient services furnished can reasonably be expected to improve the patient's condition.   Amear Strojny A 06/17/2012, 1:46 PM

## 2012-06-17 NOTE — Progress Notes (Signed)
[facility] Group Notes:  (Nursing/MHT/Case Management/Adjunct)  Date:  06/17/2012  Time:  11:30 AM  Type of Therapy:  Therapeutic Activity  Participation Level:  Minimal  Participation Quality:  Appropriate and Resistant  Affect:  Appropriate and Flat  Cognitive:  Appropriate  Insight:  Appropriate and Improving  Engagement in Group:  Lacking  Modes of Intervention:  Activity and Socialization   Dalia Heading 06/17/2012, 11:30 AM

## 2012-06-18 DIAGNOSIS — F329 Major depressive disorder, single episode, unspecified: Principal | ICD-10-CM

## 2012-06-18 DIAGNOSIS — F1020 Alcohol dependence, uncomplicated: Secondary | ICD-10-CM

## 2012-06-18 MED ORDER — ARIPIPRAZOLE 2 MG PO TABS
2.0000 mg | ORAL_TABLET | Freq: Two times a day (BID) | ORAL | Status: DC
  Administered 2012-06-18 – 2012-06-20 (×4): 2 mg via ORAL
  Filled 2012-06-18 (×7): qty 1

## 2012-06-18 NOTE — Progress Notes (Addendum)
[name] [name] is seen out in the milieu .Marland KitchenMarland KitchenUAL without diff. HE remains flat, depressed, with a blunted affect. HE makes poor eye contact . HE takes his medications according to how they are scheduled per MD. HE is attending his scheduled groups, actively listens to the discussion and is engaged in his recovery . HE reports that he slept only 3  Hours last night, even though his trazadone was increased to 100 mg po.   A He completed his self inventory this morning and on it he wrote he denied SI within the past 24 hrs, he rated his depression and hopelessness " 3/3" and stated his DC plan is to " attend meetings". POC includes NP increasing his abilify from QD to bid.   R Safety is in place and POC cont with therapeutic relationship fostered.

## 2012-06-18 NOTE — Progress Notes (Signed)
Psychoeducational Group Note  Date:  06/18/12 Time: 1015  Group Topic/Focus:  Identifying Needs:   The focus of this group is to help patients identify their personal needs that have been historically problematic and identify healthy behaviors to address their needs.  Participation Level:  active Participation Quality: good Affect: flat Cognitive: good   Insight:  good  Engagement in Group: engaged  Additional Comments: Pt was engaged in group, asked appropriate questions, shared personal life experience with the group and demonstrates willingness to process and understand her problems. PDuke RN [facility]

## 2012-06-18 NOTE — Clinical Social Work Psychosocial (Signed)
[facility] Group Notes:  (Clinical Social Work)  06/18/2012  10:00-11:00AM  Summary of Progress/Problems:   The main focus of today's process group was for the patient to identify ways in which they have in the past sabotaged their own recovery. Cognitive Behavioral Therapy concepts about the interconnectedness of thoughts/feelings/actions were introduced, and there was practice time for the techniques of Thought Stopping and Thought Replacement. The patient expressed some understanding of the concepts, was mostly quiet during group.  His examples were tangential to why he is hospitalized at this time.  Type of Therapy:  Group Therapy - Process - Motivational Interviewing  Participation Level:  Active  Participation Quality:  Attentive  Affect:  Flat  Cognitive:  Oriented  Insight:  Engaged  Engagement in Therapy:  Engaged  Modes of Intervention:  Education, Teacher, English as a foreign language, Exploration, Discussion   [name] Mantle, LCSW 06/18/2012, 12:03 PM

## 2012-06-18 NOTE — Progress Notes (Signed)
Patient ID: [name] [name], male   DOB: 1960-07-10, 52 y.o.   MRN: [id number] [facility] MD Progress Note  06/18/2012 3:07 PM Wheeler Incorvaia  MRN:  [id number]  Subjective:  "I feel like I have hurt a lot of people, my family members in particular. I came into this hospital because I was feeling very bad. 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  I know what I had done to myself and my family when I relapsed. That got me even deeper in my depression. I thought that the only way out is to commit suicide. I attempted to take my own life. But it failed. I am feeling depressed still, but a little better now than when I first came in. I owe my mother in-law an apology for all the things I had said to her. I am ready to tell her that I am sorry, but I don't think that she is ready to accept my apology and or to hear me out yet. I am not sleeping well, I'm currently taking Trazodone on prn basis. I don't want trazodone dose increased because it gives me dry mouth. The suicidal thoughts keep popping in and out".  Diagnosis:   Axis I: Major Depression, recurrent, Polysubstance Dependence Axis II: Deferred Axis III:  Past Medical History  Diagnosis Date  . Thrombocytopenia   . Cirrhosis   . Hepatitis C   . Hypertension   . Anxiety   . Anemia   . Blood transfusion   . Obesity    Axis IV: other psychosocial or environmental problems and problems with primary support group Axis V: 51-60 moderate symptoms  ADL's:  Intact  Sleep: Good  Appetite:  Fair  Suicidal Ideation:  Plan:  ideas, no plans Intent:  denies Means:  denies Homicidal Ideation:  Plan:  denies Intent:  denies Means:  denies  AEB (as evidenced by): Per patient's reports.  Psychiatric Specialty Exam: Review of Systems  Constitutional: Negative.   HENT: Negative.   Eyes: Negative.   Respiratory: Negative.   Cardiovascular: Negative.   Gastrointestinal: Negative.   Genitourinary: Negative.   Musculoskeletal: Negative.   Skin: Negative.    Neurological: Negative.   Endo/Heme/Allergies: Negative.   Psychiatric/Behavioral: Positive for depression, suicidal ideas and substance abuse. The patient is nervous/anxious and has insomnia.     Blood pressure 125/79, pulse 76, temperature 98.8 F (37.1 C), temperature source Oral, resp. rate 12, SpO2 96.00%.There is no height or weight on file to calculate BMI.  General Appearance: Fairly Groomed  Patent attorney::  Minimal  Speech:  Clear and Coherent, Slow and not spontaneous  Volume:  Decreased  Mood:  Depressed  Affect:  Restricted  Thought Process:  Coherent and Goal Directed  Orientation:  Full (Time, Place, and Person)  Thought Content:  worries, ruminations, poor self esteem, a sense of hopelessness (better since he talk to his wife)  Suicidal Thoughts:  Yes, without intent/plan.  Homicidal Thoughts:  No  Memory:  Immediate;   Fair Recent;   Fair Remote;   Fair  Judgement:  Fair  Insight:  Present  Psychomotor Activity:  Within norm  Concentration:  Fair  Recall:  Fair  Akathisia:  No  Handed:  Right  AIMS (if indicated):     Assets:  Desire for Improvement  Sleep:  Number of Hours: 6.25    Current Medications: Current Facility-Administered Medications  Medication Dose Route Frequency Provider Last Rate Last Dose  . acetaminophen (TYLENOL) tablet 650 mg  650 mg Oral Q6H  PRN Kerry Hough, PA      . alum & mag hydroxide-simeth (MAALOX/MYLANTA) 200-200-20 MG/5ML suspension 30 mL  30 mL Oral Q4H PRN Kerry Hough, PA      . ARIPiprazole (ABILIFY) tablet 2 mg  2 mg Oral Daily Rachael Fee, MD   2 mg at 06/18/12 0818  . chlordiazePOXIDE (LIBRIUM) capsule 25 mg  25 mg Oral QID PRN Kerry Hough, PA      . cloNIDine (CATAPRES) tablet 0.1 mg  0.1 mg Oral QAC breakfast Kerry Hough, PA   0.1 mg at 06/18/12 0818  . dicyclomine (BENTYL) tablet 20 mg  20 mg Oral Q6H PRN Kerry Hough, PA      . feeding supplement (ENSURE COMPLETE) liquid 237 mL  237 mL Oral BID BM  Lavena Bullion, RD   237 mL at 06/17/12 1400  . FLUoxetine (PROZAC) capsule 20 mg  20 mg Oral Daily Rachael Fee, MD   20 mg at 06/18/12 1610  . lisinopril (PRINIVIL,ZESTRIL) tablet 20 mg  20 mg Oral Daily Rachael Fee, MD   20 mg at 06/18/12 9604   And  . hydrochlorothiazide (MICROZIDE) capsule 12.5 mg  12.5 mg Oral Daily Rachael Fee, MD   12.5 mg at 06/18/12 5409  . hydrOXYzine (ATARAX/VISTARIL) tablet 25 mg  25 mg Oral Q6H PRN Kerry Hough, PA   25 mg at 06/16/12 8119  . hydrOXYzine (ATARAX/VISTARIL) tablet 50 mg  50 mg Oral QHS,MR X 1 Kerry Hough, PA   50 mg at 06/17/12 2206  . lactulose (CHRONULAC) 10 GM/15ML solution 20 g  20 g Oral QHS Kerry Hough, PA   20 g at 06/17/12 2206  . loperamide (IMODIUM) capsule 2-4 mg  2-4 mg Oral PRN Kerry Hough, PA      . magnesium hydroxide (MILK OF MAGNESIA) suspension 30 mL  30 mL Oral Daily PRN Kerry Hough, PA      . methocarbamol (ROBAXIN) tablet 500 mg  500 mg Oral Q8H PRN Kerry Hough, PA      . multivitamin with minerals tablet 1 tablet  1 tablet Oral Daily Rachael Fee, MD   1 tablet at 06/18/12 0800  . naproxen (NAPROSYN) tablet 500 mg  500 mg Oral BID PRN Kerry Hough, PA      . ondansetron (ZOFRAN-ODT) disintegrating tablet 4 mg  4 mg Oral Q6H PRN Kerry Hough, PA   4 mg at 06/16/12 0630  . traZODone (DESYREL) tablet 100 mg  100 mg Oral QHS PRN Rachael Fee, MD   100 mg at 06/17/12 2214    Lab Results: No results found for this or any previous visit (from the past 48 hour(s)).  Physical Findings: AIMS: Facial and Oral Movements Muscles of Facial Expression: None, normal Lips and Perioral Area: None, normal Jaw: None, normal Tongue: None, normal,Extremity Movements Upper (arms, wrists, hands, fingers): None, normal Lower (legs, knees, ankles, toes): None, normal, Trunk Movements Neck, shoulders, hips: None, normal, Overall Severity Severity of abnormal movements (highest score from questions above): None,  normal Incapacitation due to abnormal movements: None, normal Patient's awareness of abnormal movements (rate only patient's report): No Awareness, Dental Status Current problems with teeth and/or dentures?: No Does patient usually wear dentures?: No  CIWA:  CIWA-Ar Total: 2  COWS:  COWS Total Score: 0   Treatment Plan Summary: Daily contact with patient to assess and evaluate symptoms and progress in  treatment Medication management  Plan: Supportive approach/coping skills/relapse prevention  Increase Abilify 2 mg bid for mood control/adjunct tx for depression.          Keep Trazodone at current dose.       Continue current treatment plan.            Medical Decision Making Problem Points:  Review of last therapy session (1) and Review of psycho-social stressors (1) Data Points:  Review of medication regiment & side effects (2) Review of new medications or change in dosage (2)  I certify that inpatient services furnished can reasonably be expected to improve the patient's condition.   Armandina Stammer I 06/18/2012, 3:07 PM

## 2012-06-18 NOTE — Progress Notes (Signed)
Adult Psychoeducational Group Note  Date:  06/18/2012 Time:  2000  Group Topic/Focus:  AA  Participation Level:  Active  Participation Quality:  Appropriate  Affect:  Appropriate  Cognitive:  Appropriate  Insight: Appropriate  Engagement in Group:  Engaged  Modes of Intervention:  Support  Additional Comments:  Pt attended AA group this evening.   Kuper Rennels A 06/18/2012, 2:17 AM

## 2012-06-18 NOTE — Progress Notes (Signed)
Psychoeducational Group Note  Date:  06/18/13 Time: 1015  Group Topic/Focus:  Identifying Needs:   The focus of this group is to help patients identify their personal needs that have been historically problematic and identify healthy behaviors to address their needs.  Participation Level:  active Participation Quality: good Affect: flat Cognitive:  good  Insight:  good  Engagement in Group: engaged  Additional Comments:   PD RN [facility] 1645

## 2012-06-18 NOTE — Progress Notes (Signed)
Patient did attend the evening speaker AA meeting.  

## 2012-06-19 NOTE — Progress Notes (Signed)
Adult Psychoeducational Group Note  Date:  06/19/2012 Time:  10:03 AM  Group Topic/Focus:  Managing Feelings:   The focus of this group is to identify what feelings patients have difficulty handling and develop a plan to handle them in a healthier way upon discharge.  Participation Level:  Minimal  Participation Quality:  Appropriate, Attentive and Supportive  Affect:  Appropriate and Flat  Cognitive:  Alert and Appropriate  Insight: Appropriate and Improving  Engagement in Group:  Improving  Modes of Intervention:  Activity, Education, Socialization and Support  Additional Comments:  Pt engaged in group discussion and activity where pts made "crap bags" an example of a coping skill they can use after discharge. Pts were asked to identify stressors in their life that they feel they need to cope with. Pt was able to identify at least 3 stressor to place in his "crap bag" but did not want to share these with the group.   Dalia Heading 06/19/2012, 10:03 AM

## 2012-06-19 NOTE — Progress Notes (Signed)
Psychoeducational Group Note  Psychoeducational Group Note  Date: 06/19/2012 Time:  06/19/2012  Group Topic/Focus:  Gratefulness:  The focus of this group is to help patients identify what two things they are most grateful for in their lives. What helps ground them and to center them on their work to their recovery.  Participation Level:  Active  Participation Quality:  Appropriate  Affect:  Appropriate  Cognitive:  Appropriate  Insight:  Engaged  Engagement in Group:  Engaged  Additional Comments:    Colyn Miron A   

## 2012-06-19 NOTE — Clinical Social Work Psychosocial (Signed)
[facility] Group Notes:  (Clinical Social Work)  06/19/2012  10:00-11:00AM  Summary of Progress/Problems:   The main focus of today's process group was for the patient to define "support" and describe what healthy supports are, then to identify the patient's current support system and decide on other supports that can be put in place to prevent future hospitalizations.   Toward end of group, scaling questions were asked to determine patients' level of motivation to seek support and confidence in doing so (1 lowest - 10 highest). The patient expressed that he understands now that it has been easier or more comfortable for him to isolate himself as opposed to putting forth energy to seek out supports, such as going to an AA or NA meeting.  He stated he feels a strong need to attend meetings upon discharge.  His motivation is at 7 and his confidence at 10.  Type of Therapy:  Process Group with Motivational Interviewing  Participation Level:  Active  Participation Quality:  Attentive and Sharing  Affect:  Blunted  Cognitive:  Oriented  Insight:  Engaged  Engagement in Therapy:  Engaged  Modes of Intervention:  Clarification, Education, Limit-setting, Problem-solving, Socialization, Support and Processing, Exploration, Discussion, Role-Play   [name] Mantle, LCSW 06/19/2012, 12:18 PM

## 2012-06-19 NOTE — Progress Notes (Signed)
Patient ID: [name] [name], male   DOB: 03-Sep-1960, 52 y.o.   MRN: [id number] D)  Has been out on the hall this evening, attended group,  Rather distant and reserved, but responds appropriately with staff and peers.  Watched tv for awhile after group, then came to med window for hs meds, no c/o's voiced at that time.  Denies having w/d sx at that time.   A)  Will continue to monitor q 15 minutes for safety, continue POC. R)  Safety maintained.

## 2012-06-19 NOTE — Progress Notes (Signed)
Patient ID: [name] [name], male   DOB: Sep 03, 1960, 52 y.o.   MRN: [id number] [facility] MD Progress Note  06/19/2012 1:54 PM [name] [name]  MRN:  [id number]  Subjective: "I slept well last night, and today is my best day so far." Met with the patient 1:1 to discuss his response to treatment.  "I don't really see a need to do any further rehab as I can access the same things at home." Had his longest period of sobriety after 13 months at [facility]. Diagnosis:   Axis I: Major Depression, recurrent, Polysubstance Dependence Axis II: Deferred Axis III:  Past Medical History  Diagnosis Date  . Thrombocytopenia   . Cirrhosis   . Hepatitis C   . Hypertension   . Anxiety   . Anemia   . Blood transfusion   . Obesity    Axis IV: other psychosocial or environmental problems and problems with primary support group Axis V: 51-60 moderate symptoms  ADL's:  Intact  Sleep: Good  Appetite:  "Not real real well"  Suicidal Ideation:  Plan:  ideas, no plans Intent:  denies Means:  denies Homicidal Ideation:  Plan:  denies Intent:  denies Means:  denies  AEB (as evidenced by): Per patient's reports.  Psychiatric Specialty Exam: Review of Systems  Constitutional: Negative.  Negative for chills and diaphoresis.  HENT: Negative.   Eyes: Negative.  Negative for blurred vision.  Respiratory: Negative.   Cardiovascular: Negative.   Gastrointestinal: Negative.  Negative for heartburn, nausea, vomiting and diarrhea.  Genitourinary: Negative.   Musculoskeletal: Negative.   Skin: Negative.   Neurological: Negative for dizziness, tremors and headaches. Tingling: in tips of his fingers.  Endo/Heme/Allergies: Negative.   Psychiatric/Behavioral: Positive for depression and substance abuse. Negative for suicidal ideas. The patient is nervous/anxious and has insomnia.     Blood pressure 123/76, pulse 71, temperature 98.2 F (36.8 C), temperature source Oral, resp. rate 20, SpO2 96.00%.There is no  height or weight on file to calculate BMI.  General Appearance: Fairly Groomed  Patent attorney::  Minimal  Speech:  Clear and Coherent, Slow and not spontaneous  Volume:  Decreased  Mood:  Depressed  Affect:  Restricted  Thought Process:  Coherent and Goal Directed  Orientation:  Full (Time, Place, and Person)  Thought Content:  worries, ruminations, poor self esteem, a sense of hopelessness (better since he talk to his wife)  Suicidal Thoughts:  Yes, without intent/plan.  Homicidal Thoughts:  No  Memory:  Immediate;   Fair Recent;   Fair Remote;   Fair  Judgement:  Fair  Insight:  Present  Psychomotor Activity:  Within norm  Concentration:  Fair  Recall:  Fair  Akathisia:  No  Handed:  Right  AIMS (if indicated):     Assets:  Desire for Improvement  Sleep:  Number of Hours: 6    Current Medications: Current Facility-Administered Medications  Medication Dose Route Frequency Provider Last Rate Last Dose  . acetaminophen (TYLENOL) tablet 650 mg  650 mg Oral Q6H PRN Kerry Hough, PA      . alum & mag hydroxide-simeth (MAALOX/MYLANTA) 200-200-20 MG/5ML suspension 30 mL  30 mL Oral Q4H PRN Kerry Hough, PA      . ARIPiprazole (ABILIFY) tablet 2 mg  2 mg Oral BID Sanjuana Kava, NP   2 mg at 06/19/12 7829  . chlordiazePOXIDE (LIBRIUM) capsule 25 mg  25 mg Oral QID PRN Kerry Hough, PA      . cloNIDine (CATAPRES)  tablet 0.1 mg  0.1 mg Oral QAC breakfast Kerry Hough, PA   0.1 mg at 06/18/12 0818  . feeding supplement (ENSURE COMPLETE) liquid 237 mL  237 mL Oral BID BM Lavena Bullion, RD   237 mL at 06/18/12 1400  . FLUoxetine (PROZAC) capsule 20 mg  20 mg Oral Daily Rachael Fee, MD   20 mg at 06/19/12 1914  . lisinopril (PRINIVIL,ZESTRIL) tablet 20 mg  20 mg Oral Daily Rachael Fee, MD   20 mg at 06/19/12 7829   And  . hydrochlorothiazide (MICROZIDE) capsule 12.5 mg  12.5 mg Oral Daily Rachael Fee, MD   12.5 mg at 06/19/12 5621  . hydrOXYzine (ATARAX/VISTARIL) tablet 50  mg  50 mg Oral QHS,MR X 1 Kerry Hough, PA   50 mg at 06/18/12 2212  . lactulose (CHRONULAC) 10 GM/15ML solution 20 g  20 g Oral QHS Kerry Hough, PA   20 g at 06/18/12 2213  . magnesium hydroxide (MILK OF MAGNESIA) suspension 30 mL  30 mL Oral Daily PRN Kerry Hough, PA      . multivitamin with minerals tablet 1 tablet  1 tablet Oral Daily Rachael Fee, MD   1 tablet at 06/19/12 [phone]  . traZODone (DESYREL) tablet 100 mg  100 mg Oral QHS PRN Rachael Fee, MD   100 mg at 06/17/12 2214    Lab Results: No results found for this or any previous visit (from the past 48 hour(s)).  Physical Findings: AIMS: Facial and Oral Movements Muscles of Facial Expression: None, normal Lips and Perioral Area: None, normal Jaw: None, normal Tongue: None, normal,Extremity Movements Upper (arms, wrists, hands, fingers): None, normal Lower (legs, knees, ankles, toes): None, normal, Trunk Movements Neck, shoulders, hips: None, normal, Overall Severity Severity of abnormal movements (highest score from questions above): None, normal Incapacitation due to abnormal movements: None, normal Patient's awareness of abnormal movements (rate only patient's report): No Awareness, Dental Status Current problems with teeth and/or dentures?: No Does patient usually wear dentures?: No  CIWA:  CIWA-Ar Total: 2  COWS:  COWS Total Score: 0   Treatment Plan Summary: Daily contact with patient to assess and evaluate symptoms and progress in treatment Medication management  Plan: Supportive approach/coping skills/relapse prevention  Increase Abilify 2 mg bid for mood control/adjunct tx for depression.          Keep Trazodone at current dose.       Continue current treatment plan.            Medical Decision Making Problem Points:  Review of last therapy session (1) and Review of psycho-social stressors (1) Data Points:  Review of medication regiment & side effects (2) Review of new medications or change in  dosage (2)  I certify that inpatient services furnished can reasonably be expected to improve the patient's condition.  Rona Ravens. Yang Rack PAC 06/19/2012, 1:54 PM

## 2012-06-19 NOTE — Progress Notes (Signed)
D Kartel remains acutely depressed, sad, isolative and flat. His color is greyish. He makes very  brief eye contact. He is  Distracted...like he is preoccupied and when this nurse asks him if he is preoccupied, he responds that his thoughts are " fast". He completed his AM self inventory and on it he wrote he denied SI, he rated his depression and hopelessness " 5 / 4 " and stated his DC plan is to " follow thought patterns and eat well."   A  He is compliant with his POC , attends his groups and takes his scheduled meds as ordered.   R Safety is in place, therapeutic relationship is fostered and POC cont.

## 2012-06-20 DIAGNOSIS — F1920 Other psychoactive substance dependence, uncomplicated: Secondary | ICD-10-CM

## 2012-06-20 MED ORDER — LACTULOSE 10 GM/15ML PO SOLN: 20.0000 g | Freq: Every day | ORAL | Status: DC

## 2012-06-20 MED ORDER — ARIPIPRAZOLE 2 MG PO TABS: 2.0000 mg | ORAL_TABLET | Freq: Two times a day (BID) | ORAL | Status: DC

## 2012-06-20 MED ORDER — HYDROXYZINE HCL 50 MG PO TABS: 50.0000 mg | ORAL_TABLET | Freq: Every evening | ORAL | Status: DC | PRN

## 2012-06-20 MED ORDER — ADULT MULTIVITAMIN W/MINERALS CH: 1.0000 | ORAL_TABLET | Freq: Every morning | ORAL | Status: DC

## 2012-06-20 MED ORDER — FLUOXETINE HCL 20 MG PO CAPS: 20.0000 mg | ORAL_CAPSULE | Freq: Every day | ORAL | Status: DC

## 2012-06-20 MED ORDER — TRAZODONE HCL 100 MG PO TABS: 100.0000 mg | ORAL_TABLET | Freq: Every evening | ORAL | Status: DC | PRN

## 2012-06-20 MED ORDER — LISINOPRIL-HYDROCHLOROTHIAZIDE 20-12.5 MG PO TABS: 1.0000 | ORAL_TABLET | Freq: Every morning | ORAL | Status: DC

## 2012-06-20 NOTE — Progress Notes (Signed)
Patient ID: [name] [name], male   DOB: [birth year], 52 y.o.   MRN: [id number] D)  Has been flat and depressed this evening, but automatically smiles on approach and states he's doing OK.  Appears to be internally focused but not responding to internal stimuli.   Didn't attend group this evening but stayed in the dayroom and watched the game.  Was quiet, little interaction with peers, and doesn't initiate conversation. A)  Has been compliant with meds, will continue to monitor for safety and continue POC, continue to try to develop rapport. R)  Remains safe on unit at this time.

## 2012-06-20 NOTE — Progress Notes (Signed)
Pt was discharged home today.  He denied any S/I H/I or A/V hallucinations.    He was given f/u appointment, rx, sample medications, hotline info booklet.  He voiced understanding to all instructions provided.  He declined the need for smoking cessation materials.  He removed his nicotine patch before he left. 

## 2012-06-20 NOTE — Progress Notes (Signed)
[facility] Adult Case Management Discharge Plan :  Will you be returning to the same living situation after discharge: No. Patient going to [facility] for treatment and will explore other possibilities from there At discharge, do you have transportation home?:Yes,  security will escort patient to ED where his vehicle is and patient will drive to [facility] Do you have the ability to pay for your medications:Yes,  patient has medicare, medicaid  Release of information consent forms completed and in the chart;  Patient's signature needed at discharge.  Patient to Follow up at: Follow-up Information    Follow up with [facility]. On 06/20/2012. (Be at [facility] before 3:30 PM Today )    Contact information:   [address] [place], [place]  [address] [facility] [phone] Valinda Hoar [phone]         Patient denies SI/HI:   Yes,      Safety Planning and Suicide Prevention discussed:  Yes,  with patient as two unsuccessful attempts to contact family  [name] 06/20/2012, 3:16 PM

## 2012-06-20 NOTE — Progress Notes (Signed)
Patient ID: Keshawn Fiorito, male   DOB: 10/10/1960, 52 y.o.   MRN: [id number] Patient's wife, Orlen Leedy at [phone], called to report patient was at home and she was under impression [facility] was a 30 day treatment program.  Explained to patient's wife that we are crisis management and patient was discharged earlier today to go to pick up his vehicle at hospital and drive to [facility] treatment center on [address] in [place], [place].  Patient's wife reports "he says he can go anytime until Wednesday at 3:30PM." It was explained to wife he needed to be there before 3:30 PM today; patient refused to come to telephone to speak with Clinical research associate.  CSW contacted [facility] who shared that patient could call tomorrow (Tuesday 2/4) and ask about bed, being sure to let them know prescreen has been completed. Called to let Karenknow of Tuesday possibility and provide mobile crisis number. As she was unable to write down mobile crisis number, CSW contacted home number she provided to speak with discharged patient.  No answer at number provided of [phone]; CSW left instructions for contacting [facility], 911 and or mobile crisis management services.  [name]

## 2012-06-20 NOTE — Progress Notes (Signed)
[facility] LCSW Aftercare Discharge Planning Group Note  06/20/2012 12:26 PM  Participation Quality:  Appropriate  Affect:  Appropriate and Depressed  Cognitive:  Appropriate  Insight:  Improving  Engagement in Group:  Developing/Improving  Modes of Intervention:  Discussion, Exploration and Support  Summary of Progress/Problems:[name] [name] continues to report he feels need for inpatient treatment, denies Suicidal Ideation and reated his anxiety at a 2, depression at a 5.   [name] 06/20/2012, 12:26 PM

## 2012-06-20 NOTE — Progress Notes (Signed)
Adult Psychoeducational Group Note  Date:  06/20/2012 Time:  12:38 PM  Group Topic/Focus:  Self Care:   The focus of this group is to help patients understand the importance of self-care in order to improve or restore emotional, physical, spiritual, interpersonal, and financial health.  Participation Level:  Minimal  Participation Quality:  Drowsy and Inattentive  Affect:  Flat and Lethargic  Cognitive:  Appropriate and Oriented  Insight: Good  Engagement in Group:  Limited and Poor  Modes of Intervention:  Discussion, Education and Support  Additional Comments:  Pt attended group, but made minimal contributions to group discussion. At end of group, pt reported that he would like to improve his psychological self-care habits by taking time away from electronics such as telephones, email, and the Internet.  Reinaldo Raddle K 06/20/2012, 12:38 PM

## 2012-06-20 NOTE — [facility] Suicide Risk Assessment (Signed)
Suicide Risk Assessment  Discharge Assessment     Demographic Factors:  Male, Caucasian and Unemployed  Mental Status Per Nursing Assessment::   On Admission:   (Pt denies at this time)  Current Mental Status by Physician: In full contact with reality. There are no suicidal ideas, plans or intent. His mood is euthymic his affect is appropriate. He is wanting to pursue further outpatient treatment. He is going to [facility] to pursue further rehab treatment. He is also considering a CD IOP once he gets out of [facility]. Encouraged by his wife wanting to be there for him.   Loss Factors: Decline in physical health  Historical Factors: NA  Risk Reduction Factors:   Responsible for children under 57 years of age, Sense of responsibility to family, Living with another person, especially a relative and Positive social support  Continued Clinical Symptoms:  Depression:   Comorbid alcohol abuse/dependence Alcohol/Substance Abuse/Dependencies  Cognitive Features That Contribute To Risk: none identified   Suicide Risk:  Minimal: No identifiable suicidal ideation.  Patients presenting with no risk factors but with morbid ruminations; may be classified as minimal risk based on the severity of the depressive symptoms  Discharge Diagnoses:   AXIS I:  Major Depression, Polysubstance Dependence (including opioids) AXIS II:  Deferred AXIS III:   Past Medical History  Diagnosis Date  . Thrombocytopenia   . Cirrhosis   . Hepatitis C   . Hypertension   . Anxiety   . Anemia   . Blood transfusion   . Obesity    AXIS IV:  other psychosocial or environmental problems AXIS V:  61-70 mild symptoms  Plan Of Care/Follow-up recommendations:  Activity:  As tolerated Diet:  Regular Follow up [facility] CD IOP Is patient on multiple antipsychotic therapies at discharge:  No   Has Patient had three or more failed trials of antipsychotic monotherapy by history:  No  Recommended Plan for Multiple  Antipsychotic Therapies: N/A   Novah Goza A 06/20/2012, 1:16 PM

## 2012-06-20 NOTE — Progress Notes (Signed)
[facility] INPATIENT:  Family/Significant Other Suicide Prevention Education  Suicide Prevention Education:  Contact Attempts: [name] [name], wife, [phone]. [phone] has been identified by the patient as the family member/significant other with whom the patient will be residing, and identified as the person(s) who will aid the patient in the event of a mental health crisis.  With written consent from the patient, two attempts were made to provide suicide prevention education, prior to and/or following the patient's discharge.  We were unsuccessful in providing suicide prevention education.  A suicide education pamphlet was given to the patient to share with family/significant other. Writer provided suicide prevention education directly to patient; conversation included risk factors, warning signs and resources to contact for help. Mobile crisis services explained and contact information placed in chart for pt to receive at discharge.   Date and time of first attempt: 06/17/2012 at 5PM Left message Date and time of second attempt: 06/20/2012 at 11:45 AM   [name] [name], [name] [name] 06/20/2012, 1:05 PM
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
## 2012-06-20 NOTE — Progress Notes (Signed)
Adult Psychoeducational Group Note  Date:  06/20/2012 Time:  12:08 PM  Group Topic/Focus:  Healthy Communication:   The focus of this group is to discuss communication, barriers to communication, as well as healthy ways to communicate with others.  Participation Level:  Active  Participation Quality:  Appropriate and Attentive to group leader and participants  Affect:  Appropriate to the group.  Cognitive:  Alert  Insight: Good  Engagement in Group:  Engaged  Modes of Intervention:  Discussion and Education  Additional Comments:  Pt stated he wants to work on his sobriety and alc abuse while in the hospital.  Berton Mount T 06/20/2012, 12:08 PM

## 2012-06-21 NOTE — Progress Notes (Signed)
Patient Discharge Instructions:  After Visit Summary (AVS):   Faxed to:  06/21/12 Discharge Summary Note:   Faxed to:  06/21/12 Psychiatric Admission Assessment Note:   Faxed to:  06/21/12 Suicide Risk Assessment - Discharge Assessment:   Faxed to:  06/21/12 Faxed/Sent to the Next Level Care provider:  06/21/12 Faxed to [facility] @ [phone]  Jerelene Redden, 06/21/2012, 3:20 PM

## 2012-06-24 NOTE — Discharge Summary (Signed)
Agree with assessment and plan Isai Gottlieb A. Carena Stream, M.D. 

## 2012-08-21 ENCOUNTER — Other Ambulatory Visit (HOSPITAL_BASED_OUTPATIENT_CLINIC_OR_DEPARTMENT_OTHER): Payer: Self-pay | Admitting: Osteopathic Medicine

## 2012-08-21 ENCOUNTER — Inpatient Hospital Stay (HOSPITAL_BASED_OUTPATIENT_CLINIC_OR_DEPARTMENT_OTHER): Admission: RE | Admit: 2012-08-21 | Payer: Self-pay | Source: Ambulatory Visit

## 2012-08-21 DIAGNOSIS — R319 Hematuria, unspecified: Secondary | ICD-10-CM

## 2012-08-23 ENCOUNTER — Ambulatory Visit (HOSPITAL_BASED_OUTPATIENT_CLINIC_OR_DEPARTMENT_OTHER)
Admission: RE | Admit: 2012-08-23 | Discharge: 2012-08-23 | Disposition: A | Discharge status: Home / Self Care | Payer: Medicare Other | Source: Ambulatory Visit | Attending: Osteopathic Medicine | Admitting: Osteopathic Medicine

## 2012-08-23 DIAGNOSIS — B1920 Unspecified viral hepatitis C without hepatic coma: Secondary | ICD-10-CM | POA: Insufficient documentation

## 2012-08-23 DIAGNOSIS — L408 Other psoriasis: Secondary | ICD-10-CM | POA: Insufficient documentation

## 2012-08-23 DIAGNOSIS — K7460 Unspecified cirrhosis of liver: Secondary | ICD-10-CM | POA: Insufficient documentation

## 2012-08-23 DIAGNOSIS — Z87442 Personal history of urinary calculi: Secondary | ICD-10-CM | POA: Insufficient documentation

## 2012-08-23 DIAGNOSIS — D696 Thrombocytopenia, unspecified: Secondary | ICD-10-CM | POA: Insufficient documentation

## 2012-08-23 DIAGNOSIS — I10 Essential (primary) hypertension: Secondary | ICD-10-CM | POA: Insufficient documentation

## 2012-08-23 DIAGNOSIS — R109 Unspecified abdominal pain: Secondary | ICD-10-CM | POA: Insufficient documentation

## 2012-08-23 DIAGNOSIS — R319 Hematuria, unspecified: Secondary | ICD-10-CM

## 2012-09-11 ENCOUNTER — Inpatient Hospital Stay (HOSPITAL_COMMUNITY)
Admission: AD | Admit: 2012-09-11 | Discharge: 2012-09-20 | DRG: 885 | Disposition: A | Discharge status: Home / Self Care | Payer: 59 | Attending: Psychiatry | Admitting: Psychiatry

## 2012-09-11 ENCOUNTER — Encounter (HOSPITAL_COMMUNITY): Payer: Self-pay | Admitting: Licensed Clinical Social Worker

## 2012-09-11 ENCOUNTER — Encounter (HOSPITAL_BASED_OUTPATIENT_CLINIC_OR_DEPARTMENT_OTHER): Payer: Self-pay | Admitting: *Deleted

## 2012-09-11 ENCOUNTER — Emergency Department (HOSPITAL_BASED_OUTPATIENT_CLINIC_OR_DEPARTMENT_OTHER)
Admission: EM | Admit: 2012-09-11 | Discharge: 2012-09-11 | Disposition: A | Discharge status: Home / Self Care | Payer: Medicare Other | Attending: Emergency Medicine | Admitting: Emergency Medicine

## 2012-09-11 DIAGNOSIS — F329 Major depressive disorder, single episode, unspecified: Secondary | ICD-10-CM

## 2012-09-11 DIAGNOSIS — D649 Anemia, unspecified: Secondary | ICD-10-CM | POA: Insufficient documentation

## 2012-09-11 DIAGNOSIS — Z79899 Other long term (current) drug therapy: Secondary | ICD-10-CM | POA: Insufficient documentation

## 2012-09-11 DIAGNOSIS — F1020 Alcohol dependence, uncomplicated: Secondary | ICD-10-CM

## 2012-09-11 DIAGNOSIS — F29 Unspecified psychosis not due to a substance or known physiological condition: Secondary | ICD-10-CM

## 2012-09-11 DIAGNOSIS — E669 Obesity, unspecified: Secondary | ICD-10-CM | POA: Insufficient documentation

## 2012-09-11 DIAGNOSIS — I10 Essential (primary) hypertension: Secondary | ICD-10-CM | POA: Insufficient documentation

## 2012-09-11 DIAGNOSIS — Z8619 Personal history of other infectious and parasitic diseases: Secondary | ICD-10-CM | POA: Insufficient documentation

## 2012-09-11 DIAGNOSIS — F1410 Cocaine abuse, uncomplicated: Secondary | ICD-10-CM | POA: Diagnosis present

## 2012-09-11 DIAGNOSIS — F22 Delusional disorders: Principal | ICD-10-CM | POA: Diagnosis present

## 2012-09-11 DIAGNOSIS — B1920 Unspecified viral hepatitis C without hepatic coma: Secondary | ICD-10-CM | POA: Diagnosis present

## 2012-09-11 DIAGNOSIS — F411 Generalized anxiety disorder: Secondary | ICD-10-CM | POA: Insufficient documentation

## 2012-09-11 DIAGNOSIS — Z87891 Personal history of nicotine dependence: Secondary | ICD-10-CM | POA: Insufficient documentation

## 2012-09-11 DIAGNOSIS — F1110 Opioid abuse, uncomplicated: Secondary | ICD-10-CM | POA: Diagnosis present

## 2012-09-11 DIAGNOSIS — F3289 Other specified depressive episodes: Secondary | ICD-10-CM | POA: Insufficient documentation

## 2012-09-11 DIAGNOSIS — Z8719 Personal history of other diseases of the digestive system: Secondary | ICD-10-CM | POA: Insufficient documentation

## 2012-09-11 DIAGNOSIS — Z862 Personal history of diseases of the blood and blood-forming organs and certain disorders involving the immune mechanism: Secondary | ICD-10-CM | POA: Insufficient documentation

## 2012-09-11 DIAGNOSIS — K7460 Unspecified cirrhosis of liver: Secondary | ICD-10-CM | POA: Diagnosis present

## 2012-09-11 HISTORY — DX: Acute pancreatitis without necrosis or infection, unspecified: K85.90

## 2012-09-11 LAB — CBC WITH DIFFERENTIAL/PLATELET
Eosinophils Relative: 7 % — ABNORMAL HIGH (ref 0–5)
HCT: 42.1 % (ref 39.0–52.0)
Lymphocytes Relative: 14 % (ref 12–46)
Lymphs Abs: 0.6 10*3/uL — ABNORMAL LOW (ref 0.7–4.0)
MCV: 79.6 fL (ref 78.0–100.0)
Monocytes Absolute: 0.5 10*3/uL (ref 0.1–1.0)
RBC: 5.29 MIL/uL (ref 4.22–5.81)
RDW: 16 % — ABNORMAL HIGH (ref 11.5–15.5)
WBC: 4.6 10*3/uL (ref 4.0–10.5)

## 2012-09-11 LAB — COMPREHENSIVE METABOLIC PANEL
CO2: 24 mEq/L (ref 19–32)
Calcium: 9.3 mg/dL (ref 8.4–10.5)
Creatinine, Ser: 0.7 mg/dL (ref 0.50–1.35)
GFR calc Af Amer: >90 mL/min (ref >90)
GFR calc non Af Amer: >90 mL/min (ref >90)
Glucose, Bld: 126 mg/dL — ABNORMAL HIGH (ref 70–99)

## 2012-09-11 LAB — URINALYSIS, ROUTINE W REFLEX MICROSCOPIC
Hgb urine dipstick: NEGATIVE
Leukocytes, UA: NEGATIVE
Protein, ur: NEGATIVE mg/dL
Urobilinogen, UA: 1 mg/dL (ref 0.0–1.0)

## 2012-09-11 LAB — ETHANOL: Alcohol, Ethyl (B): <11 mg/dL (ref 0–11)

## 2012-09-11 LAB — RAPID URINE DRUG SCREEN, HOSP PERFORMED
Amphetamines: NOT DETECTED
Barbiturates: NOT DETECTED

## 2012-09-11 MED ORDER — MAGNESIUM HYDROXIDE 400 MG/5ML PO SUSP: 30.0000 mL | Freq: Every day | ORAL | Status: DC | PRN

## 2012-09-11 MED ORDER — ARIPIPRAZOLE 2 MG PO TABS
2.0000 mg | ORAL_TABLET | Freq: Two times a day (BID) | ORAL | Status: DC
  Administered 2012-09-11 – 2012-09-13 (×4): 2 mg via ORAL
  Filled 2012-09-11 (×8): qty 1

## 2012-09-11 MED ORDER — HYDROCHLOROTHIAZIDE 25 MG PO TABS
12.5000 mg | ORAL_TABLET | Freq: Every day | ORAL | Status: DC
  Filled 2012-09-11: qty 0.5
  Filled 2012-09-11: qty 1
  Filled 2012-09-11: qty 0.5

## 2012-09-11 MED ORDER — HYDROXYZINE HCL 50 MG PO TABS
50.0000 mg | ORAL_TABLET | Freq: Every evening | ORAL | Status: DC | PRN
  Administered 2012-09-11 – 2012-09-19 (×8): 50 mg via ORAL
  Filled 2012-09-11: qty 8
  Filled 2012-09-11 (×2): qty 1
  Filled 2012-09-11: qty 8
  Filled 2012-09-11 (×11): qty 1
  Filled 2012-09-11: qty 8
  Filled 2012-09-11 (×8): qty 1

## 2012-09-11 MED ORDER — TRAZODONE HCL 100 MG PO TABS
100.0000 mg | ORAL_TABLET | Freq: Every evening | ORAL | Status: DC | PRN
  Administered 2012-09-12 – 2012-09-15 (×4): 100 mg via ORAL
  Filled 2012-09-11 (×2): qty 1
  Filled 2012-09-11: qty 4
  Filled 2012-09-11 (×2): qty 1

## 2012-09-11 MED ORDER — ACETAMINOPHEN 325 MG PO TABS: 650.0000 mg | ORAL_TABLET | Freq: Four times a day (QID) | ORAL | Status: DC | PRN

## 2012-09-11 MED ORDER — ALUM & MAG HYDROXIDE-SIMETH 200-200-20 MG/5ML PO SUSP: 30.0000 mL | ORAL | Status: DC | PRN

## 2012-09-11 MED ORDER — LISINOPRIL 20 MG PO TABS
20.0000 mg | ORAL_TABLET | Freq: Every day | ORAL | Status: DC
  Administered 2012-09-12 – 2012-09-20 (×9): 20 mg via ORAL
  Filled 2012-09-11 (×12): qty 1

## 2012-09-11 MED ORDER — LACTULOSE 10 GM/15ML PO SOLN
20.0000 g | Freq: Every day | ORAL | Status: DC
  Administered 2012-09-11 – 2012-09-16 (×5): 20 g via ORAL
  Filled 2012-09-11 (×12): qty 30

## 2012-09-11 MED ORDER — FLUOXETINE HCL 20 MG PO CAPS
20.0000 mg | ORAL_CAPSULE | Freq: Every day | ORAL | Status: DC
  Administered 2012-09-12 – 2012-09-20 (×9): 20 mg via ORAL
  Filled 2012-09-11: qty 1
  Filled 2012-09-11: qty 4
  Filled 2012-09-11 (×10): qty 1

## 2012-09-11 NOTE — ED Notes (Signed)
Pt states that he is [facility] treatment for the past 9 days for detox of opitates and cocaine. Staff reports that patient packed his bags during the night and said the director told him that he could go home. Staff sent pt here for evaluation of hallucinations for the past day. Pt denies any thoughts of hurting himself or others at this time, but states he has had thoughts recently. Pt is on Suicide Watch at [facility].
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
## 2012-09-11 NOTE — Tx Team (Signed)
Initial Interdisciplinary Treatment Plan  PATIENT STRENGTHS: (choose at least two) Ability for insight Active sense of humor General fund of knowledge  PATIENT STRESSORS: Health problems Marital or family conflict Substance abuse   PROBLEM LIST: Problem List/Patient Goals Date to be addressed Date deferred Reason deferred Estimated date of resolution  AVH 09/11/12     Depression 09/11/12     Risk for suicide 09/11/12     Substance abuse 09/11/12                                    DISCHARGE CRITERIA:  Improved stabilization in mood, thinking, and/or behavior  PRELIMINARY DISCHARGE PLAN: Attend aftercare/continuing care group Outpatient therapy  PATIENT/FAMIILY INVOLVEMENT: This treatment plan has been presented to and reviewed with the patient, Ziaire Hagos.  The patient and family have been given the opportunity to ask questions and make suggestions.  Delos Haring 09/11/2012, 8:43 PM

## 2012-09-11 NOTE — Progress Notes (Signed)
Writer spoke with patient and informed him of medications due and he is agreeable to taking his abilify. Patient reports having difficulty sleeping and has been experiencing hallucinations but none currently. Patient appears flat, sad and depressed. Writer informed patient of sleep medications available if needed. Patient currently denies si/hi. Support and encouragement offered, safety maintained on unit with 15 min checks. Patient given pitcher of water as he requested and he returned to his room to lie down. Will continue to monitor.

## 2012-09-11 NOTE — ED Provider Notes (Signed)
History     CSN: [id number]  Arrival date & time 09/11/12  0401   First MD Initiated Contact with Patient 09/11/12 [phone]      Chief Complaint  Patient presents with  . hallunications     The history is provided by the patient.  onset - last night Course - improving Worsened by - nothing Improved by - nothing  Patient presents from [facility] treatment center for possible hallucinations It is reported that he woke up and thought the director told him to go home He reports it was "one big auditory and visual hallucination" He now feels improved He denies fever/headache/cp/weakness He has been in [facility] for [facility] days.  He denies any etoh or drug use He reports he has had similar episode of hallucinations with use of ambien in the past   Past Medical History  Diagnosis Date  . Thrombocytopenia   . Cirrhosis   . Hepatitis C   . Hypertension   . Anxiety   . Anemia   . Blood transfusion   . Obesity   . Pancreatitis     Past Surgical History  Procedure Laterality Date  . Tonsillectomy      Family History  Problem Relation Age of Onset  . Cancer - Other Mother     Uterine  . Coronary artery disease Father   . Coronary artery disease Paternal Grandfather   . Hypertension Father   . Hypertension Paternal Grandfather     History  Substance Use Topics  . Smoking status: Former Smoker    Types: Cigarettes    Quit date: 11/05/2003  . Smokeless tobacco: Not on file  . 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 states last etoh was 2011      Review of Systems  Constitutional: Negative for fever.  Respiratory: Negative for shortness of breath.   Cardiovascular: Negative for chest pain.  Gastrointestinal: Negative for vomiting.  Musculoskeletal: Negative for back pain.  Neurological: Negative for headaches.  Psychiatric/Behavioral: Negative for suicidal ideas.  All other systems reviewed and are negative.    Allergies   Review of patient's allergies indicates no known allergies.  Home Medications   Current Outpatient Rx  Name  Route  Sig  Dispense  Refill  . amantadine (SYMMETREL) 100 MG capsule   Oral   Take 100 mg by mouth 3 (three) times daily.         Marland Kitchen FLUoxetine (PROZAC) 40 MG capsule   Oral   Take 40 mg by mouth daily.         Marland Kitchen lactulose (CHRONULAC) 10 GM/15ML solution   Oral   Take 20 g by mouth 2 (two) times daily.         . OXcarbazepine (TRILEPTAL) 150 MG tablet   Oral   Take 150 mg by mouth 2 (two) times daily.         . ARIPiprazole (ABILIFY) 2 MG tablet   Oral   Take 1 tablet (2 mg total) by mouth 2 (two) times daily. For mood control   60 tablet   0   . FLUoxetine (PROZAC) 20 MG capsule   Oral   Take 1 capsule (20 mg total) by mouth daily. For depression   30 capsule   0   . hydrOXYzine (ATARAX/VISTARIL) 50 MG tablet   Oral   Take 1 tablet (50 mg total) by mouth at bedtime and may repeat dose one time if needed. For  anxiety   60 tablet   0   . lactulose (CHRONULAC) 10 GM/15ML solution   Oral   Take 30 mLs (20 g total) by mouth at bedtime. For constipation   240 mL   3   . lisinopril-hydrochlorothiazide (PRINZIDE,ZESTORETIC) 20-12.5 MG per tablet   Oral   Take 1 tablet by mouth every morning. For high blood pressure control         . traZODone (DESYREL) 100 MG tablet   Oral   Take 1 tablet (100 mg total) by mouth at bedtime as needed for sleep. For depression/sleep   30 tablet   0     BP 137/87  Pulse 77  Temp(Src) 98.2 F (36.8 C) (Oral)  SpO2 95%  Physical Exam CONSTITUTIONAL: Well developed/well nourished HEAD: Normocephalic/atraumatic EYES: EOMI/PERRL ENMT: Mucous membranes moist NECK: supple no meningeal signs SPINE:entire spine nontender CV: S1/S2 noted, no murmurs/rubs/gallops noted LUNGS: Lungs are clear to auscultation bilaterally, no apparent distress ABDOMEN: soft, nontender, no rebound or guarding GU:no cva  tenderness NEURO: Pt is awake/alert, moves all extremitiesx4. No ataxia.  No focal weakness noted in either upper or lower extremities EXTREMITIES: pulses normal, full ROM SKIN: warm, color normal PSYCH:mildly anxious but no pressured speech. Thought process appears appropriate  ED Course  Procedures  Labs Reviewed  CBC WITH DIFFERENTIAL  COMPREHENSIVE METABOLIC PANEL  AMMONIA  URINALYSIS, ROUTINE W REFLEX MICROSCOPIC  ETHANOL  URINE RAPID DRUG SCREEN (HOSP PERFORMED)    4:55 AM Pt here for evaluation of hallucinations He is awake/alert, cooperative.  He recalls hallucination and reports he has had them previously.  He is otherwise stable and does not appear psychotic.  He answer questions appropriately.  Apparently he has been on suicide watch but he currently denies SI.  He does have h/o hepatic encephalopathy.  Will check labs here in the ED.  If negative will send back to [facility] as they can continue to keep him on suicide precautions 5:58 AM Labs reassuring He is in no distress He denies any recurrent hallucinations Stable for d/c back to [facility]   MDM  Nursing notes including past medical history and social history reviewed and considered in documentation Labs/vital reviewed and considered         Joya Gaskins, MD 09/11/12 0559

## 2012-09-11 NOTE — ED Notes (Signed)
Patient cooperative and calm. Patient changed into paper scrubs and gave all his belongings to Nurse in bags at charge nurse desk.

## 2012-09-12 ENCOUNTER — Encounter (HOSPITAL_COMMUNITY): Payer: Self-pay | Admitting: Psychiatry

## 2012-09-12 DIAGNOSIS — F29 Unspecified psychosis not due to a substance or known physiological condition: Secondary | ICD-10-CM | POA: Diagnosis present

## 2012-09-12 DIAGNOSIS — F22 Delusional disorders: Principal | ICD-10-CM | POA: Diagnosis present

## 2012-09-12 MED ORDER — OXCARBAZEPINE 150 MG PO TABS
150.0000 mg | ORAL_TABLET | Freq: Two times a day (BID) | ORAL | Status: DC
  Administered 2012-09-12 – 2012-09-20 (×16): 150 mg via ORAL
  Filled 2012-09-12 (×8): qty 1
  Filled 2012-09-12: qty 8
  Filled 2012-09-12 (×2): qty 1
  Filled 2012-09-12: qty 8
  Filled 2012-09-12 (×8): qty 1

## 2012-09-12 MED ORDER — TRAZODONE HCL 100 MG PO TABS: 100.0000 mg | ORAL_TABLET | Freq: Every evening | ORAL | Status: DC | PRN

## 2012-09-12 MED ORDER — HYDROCHLOROTHIAZIDE 12.5 MG PO CAPS
12.5000 mg | ORAL_CAPSULE | Freq: Every day | ORAL | Status: DC
  Administered 2012-09-12 – 2012-09-20 (×9): 12.5 mg via ORAL
  Filled 2012-09-12 (×11): qty 1

## 2012-09-12 NOTE — [facility] Suicide Risk Assessment (Signed)
Suicide Risk Assessment  Admission Assessment     Nursing information obtained from:  Patient Demographic factors:  Male;Divorced or widowed;Unemployed;Low socioeconomic status Current Mental Status:  Self-harm thoughts;NA Loss Factors:  Decrease in vocational status;Loss of significant relationship;Decline in physical health;Financial problems / change in socioeconomic status Historical Factors:  NA Risk Reduction Factors:  Positive social support;Positive coping skills or problem solving skills  CLINICAL FACTORS:   Alcohol/Substance Abuse/Dependencies Currently Psychotic  COGNITIVE FEATURES THAT CONTRIBUTE TO RISK:  Closed-mindedness Thought constriction (tunnel vision)    SUICIDE RISK:   Moderate:  Frequent suicidal ideation with limited intensity, and duration, some specificity in terms of plans, no associated intent, good self-control, limited dysphoria/symptomatology, some risk factors present, and identifiable protective factors, including available and accessible social support.  PLAN OF CARE: Supportive approach/coping skills/relapse prevention/improve reality testing                               Will evaluate further                               (states that Abilify was D/C and he was placed on Ritalin to augment the                               Prozac and give him energy before he experienced the psychotic symptoms)                                I certify that inpatient services furnished can reasonably be expected to improve the patient's condition.  Railyn House A 09/12/2012, 3:17 PM

## 2012-09-12 NOTE — Progress Notes (Signed)
D:  Per pt self inventory pt reports sleeping fair, appetite good, energy level low, ability to pay attention improving, rates depression at a 6 out of 10 and hopelessness at a 5 out of 10, endorses SI on and off +AH on and off, denies HI, denies pain or any other symptoms.   A:  Emotional support provided, Encouraged pt to continue with treatment plan and attend all group activities, q15 min checks maintained for safety.  R:  Pt is receptive, calm cooperative and pleasant with staff, is worried because he has never heard voices before.

## 2012-09-12 NOTE — Progress Notes (Signed)
Nutrition Brief Note  Patient identified on the Malnutrition Screening Tool (MST) Report  Body mass index is 36.49 kg/(m^2). Patient meets criteria for obesity grade 1 based on current BMI.   Current diet order is regular, patient is consuming approximately very good% of meals at this time. Labs and medications reviewed.   Patient reports weight loss of 10-12 lbs in the last month.  States that he has been on a med to reduce drug cravings and he feels it has also decreased food cravings.  Has been eating well overall currently and prior to admit.  No further nutrition interventions warranted at this time. If further nutrition issues arise, please consult RD.   Oran Rein, RD, LDN Clinical Inpatient Dietitian Pager:  [phone] Weekend and after hours pager:  [phone]

## 2012-09-12 NOTE — Progress Notes (Signed)
Adult Psychoeducational Group Note  Date:  09/12/2012 Time:  1:55 PM  Group Topic/Focus:  Self Care:   The focus of this group is to help patients understand the importance of self-care in order to improve or restore emotional, physical, spiritual, interpersonal, and financial health.  Participation Level:  Minimal  Participation Quality:  Attentive  Affect:  Appropriate  Cognitive:  Appropriate  Insight: Unknown, did not share  Engagement in Group:  Limited  Modes of Intervention:  Activity and Education  Additional Comments:  Pt. Completed self-care assessment but did not talk in group.   Ruta Hinds [place] 09/12/2012, 1:55 PM

## 2012-09-12 NOTE — [facility] Counselor (Signed)
Adult Psychosocial Assessment Update Interdisciplinary Team  Previous [facility] admissions/discharges:  Admissions Discharges  Date:06/13/12 Date: 06/20/12  Date: Date:  Date: Date:  Date: Date:  Date: Date:   Changes since the last Psychosocial Assessment (including adherence to outpatient mental health and/or substance abuse treatment, situational issues contributing to decompensation and/or relapse). Patient reports after discharge in February of this year he returned home verses   Going to [facility] as arranged. He later admitted to [facility] and discharged home to await   Admit at [facility]. Wife helped patient manage medications whille at home before   Admitting to [facility] 2 weeks ago.  Patient experienced some hallucinations which  Waynet[facility] Sandy staff to bring him to ED.  Before going to [facility] patient relapsed on  Alcohol,. Opiates, Benzos and Cocaine. Also while at [facility] pt was on suicide watch   Discharge Plan 1. Will you be returning to the same living situation after discharge?   Yes: No:  X    If no, what is your plan?    Patient hopes to return to [facility]       2. Would you like a referral for services when you are discharged? Yes: X     If yes, for what services?  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 Patient will benefit from   Crisis stabilization and medication evaluation and return to treatment center where he   Has been for last two weeks. Patient will also benefit from group therapy, psychoeduca-  Tion, and discharge planning.                Signature:  [name], 09/13/2012 1:12 PM

## 2012-09-12 NOTE — H&P (Signed)
Psychiatric Admission Assessment Adult  Patient Identification:  [name] [name]  Date of Evaluation:  09/12/2012  Chief Complaint:  MDD  History of Present Illness: This is a 52 year old Caucasian male. Admitted to [facility] as a walk-in accompanied by [facility] staff. Report indicated patient was having hallucinations,  Delusions and paranoia. [name] [name] reports, "I was at the [facility] receiving treatment for substance abuse. Had been at this center x 16 days. However, at 02:30 am Saturday morning, I started to hallucinate. First, I got out of my bed, and started working into the day room. I thought that I heard my name called and it was the voice of the director. I thought that she told me that I was going home. So, I went back into my room and started packing my belongings. Then I started heading out of the treatment center towards the door. But, I was told by one of the staff that I was not suppose to be going home, and that no one told me to go home. As I was leaving the center, I felt confused because I did not know where I was gonna go. I went back to my room, lay back in bed. I started to hallucinate again. I was talking and conversing with people that were not there. I don't know why this is happening to me. But, I remembered one time when I felt like this. At the time, I took Ambien medicine for sleep. But, I know that I have not done anything to make this happen. I feel afraid".   Elements:  Location:  [facility] adult unit. Quality:  Hallucinations, delusions, fear. Severity:  Moderate. Timing:  Started yesterday at 02:30 am. Duration:  It's been going on x 1 full day. Context:  Fearful, worried, confused, concerned.  Associated Signs/Synptoms:  Depression Symptoms:  depressed mood, psychomotor retardation, anxiety, insomnia,  (Hypo) Manic Symptoms:  Delusions, Hallucinations,  Anxiety Symptoms:  Excessive Worry,  Psychotic Symptoms:  Delusions, Hallucinations:  Auditory Visual  PTSD Symptoms: Had a traumatic exposure:  Denies  Psychiatric Specialty Exam: Physical Exam  Constitutional: He is oriented to person, place, and time. He appears well-developed.  HENT:  Head: Normocephalic.  Eyes: Pupils are equal, round, and reactive to light.  Neck: Normal range of motion.  Cardiovascular: Normal rate.   Respiratory: Effort normal.  GI: Soft.  Hx constipation  Musculoskeletal: Normal range of motion.  Neurological: He is alert and oriented to person, place, and time.  Skin: Skin is warm and dry.  Psychiatric: His speech is normal and behavior is normal. Thought content normal. His mood appears anxious. Cognition and memory are normal. He expresses inappropriate judgment. He exhibits a depressed mood.    Review of Systems  Constitutional: Negative.   HENT: Negative.   Eyes: Negative.   Respiratory: Negative.   Cardiovascular: Negative.   Gastrointestinal: Positive for constipation.  Genitourinary: Negative.   Musculoskeletal: Negative.   Skin: Negative.   Neurological: Negative.   Endo/Heme/Allergies: Negative.   Psychiatric/Behavioral: Positive for depression, suicidal ideas, hallucinations and substance abuse. Negative for memory loss. The patient is nervous/anxious and has insomnia.     Blood pressure 122/83, pulse 84, temperature 98.2 F (36.8 C), temperature source Oral, resp. rate 20, height 6' 2.5" (1.892 m), weight 130.636 kg (288 lb).Body mass index is 36.49 kg/(m^2).  General Appearance: Casual and Fairly Groomed  Patent attorney::  Fair  Speech:  Clear and Coherent  Volume:  Normal  Mood:  Angry, Anxious, Depressed and  appears confused about his current situation.  Affect:  Restricted  Thought Process:  Coherent  Orientation:  Full (Time, Place, and Person)  Thought Content:  Hallucinations: Auditory Visual and Rumination  Suicidal Thoughts:  No  Homicidal Thoughts:  No  Memory:  Immediate;   Good Recent;   Fair Remote;    Fair  Judgement:  Fair  Insight:  Fair  Psychomotor Activity:  Normal  Concentration:  Fair  Recall:  Fair  Akathisia:  No  Handed:  Right  AIMS (if indicated):     Assets:  Desire for Improvement  Sleep:  Number of Hours: 2.75    Past Psychiatric History: Diagnosis: Delusional disorder with psychotic features, Hx Substance abuse issues  Hospitalizations: [facility] x 2  Outpatient Care: [facility],  Substance Abuse Care: [facility]  Self-Mutilation: Denies  Suicidal Attempts: Denies attempts and or thoughts  Violent Behaviors: None reported   Past Medical History:   Past Medical History  Diagnosis Date  . Thrombocytopenia   . Cirrhosis   . Hepatitis C   . Hypertension   . Anxiety   . Anemia   . Blood transfusion   . Obesity   . Pancreatitis    Cardiac History:  HTN, Anemia, Hx. blood transfusion  Allergies:  No Known Allergies  PTA Medications: Prescriptions prior to admission  Medication Sig Dispense Refill  . amantadine (SYMMETREL) 100 MG capsule Take 100 mg by mouth 3 (three) times daily.      . ARIPiprazole (ABILIFY) 2 MG tablet Take 1 tablet (2 mg total) by mouth 2 (two) times daily. For mood control  60 tablet  0  . FLUoxetine (PROZAC) 20 MG capsule Take 1 capsule (20 mg total) by mouth daily. For depression  30 capsule  0  . FLUoxetine (PROZAC) 40 MG capsule Take 40 mg by mouth daily.      . hydrOXYzine (ATARAX/VISTARIL) 50 MG tablet Take 1 tablet (50 mg total) by mouth at bedtime and may repeat dose one time if needed. For anxiety  60 tablet  0  . lactulose (CHRONULAC) 10 GM/15ML solution Take 30 mLs (20 g total) by mouth at bedtime. For constipation  240 mL  3  . lactulose (CHRONULAC) 10 GM/15ML solution Take 20 g by mouth 2 (two) times daily.      Marland Kitchen lisinopril-hydrochlorothiazide (PRINZIDE,ZESTORETIC) 20-12.5 MG per tablet Take 1 tablet by mouth every morning. For high blood pressure control      . OXcarbazepine (TRILEPTAL) 150 MG tablet Take  150 mg by mouth 2 (two) times daily.      . traZODone (DESYREL) 100 MG tablet Take 1 tablet (100 mg total) by mouth at bedtime as needed for sleep. For depression/sleep  30 tablet  0    Previous Psychotropic Medications:  Medication/Dose  See medication lists               Substance Abuse History in the last 12 months:  yes  Consequences of Substance Abuse: Medical Consequences:  Liver damage, Possible death by overdose Legal Consequences:  Arrests, jail time, Loss of driving privilege. Family Consequences:  Family discord, divorce and or separation.  Social History:  reports that he quit smoking about 8 years ago. His smoking use included Cigarettes. He smoked 0.00 packs per day. He does not have any smokeless tobacco history on file. He reports that  drinks alcohol. He reports that he uses illicit drugs (Cocaine, Hydrocodone, and Benzodiazepines). 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  Legal History: None pending.  Hobbies/Interests: NA  Family History:   Family History  Problem Relation Age of Onset  . Cancer - Other Mother     Uterine  . Coronary artery disease Father   .  Coronary artery disease Paternal Grandfather   . Hypertension Father   . Hypertension Paternal Grandfather     Results for orders placed during the hospital encounter of 09/11/12 (from the past 72 hour(s))  CBC WITH DIFFERENTIAL     Status: Abnormal   Collection Time    09/11/12  4:54 AM      Result Value Range   WBC 4.6  4.0 - 10.5 K/uL   RBC 5.29  4.22 - 5.81 MIL/uL   Hemoglobin 14.8  13.0 - 17.0 g/dL   HCT 16.1  09.6 - 04.5 %   MCV 79.6  78.0 - 100.0 fL   MCH 28.0  26.0 - 34.0 pg   MCHC 35.2  30.0 - 36.0 g/dL   RDW 40.9 (*) 81.1 - 91.4 %   Platelets 93 (*) 150 - 400 K/uL   Comment: REPEATED TO VERIFY     SPECIMEN CHECKED FOR CLOTS     PLATELET COUNT CONFIRMED BY SMEAR   Neutrophils Relative 67  43 - 77 %   Neutro Abs 3.1  1.7 - 7.7 K/uL   Lymphocytes Relative 14  12 - 46 %   Lymphs Abs 0.6 (*) 0.7 - 4.0 K/uL   Monocytes Relative 11  3 - 12 %   Monocytes Absolute 0.5  0.1 - 1.0 K/uL   Eosinophils Relative 7 (*) 0 - 5 %   Eosinophils Absolute 0.3  0.0 - 0.7 K/uL   Basophils Relative 1  0 - 1 %   Basophils Absolute 0.1  0.0 - 0.1 K/uL  COMPREHENSIVE METABOLIC PANEL     Status: Abnormal   Collection Time    09/11/12  4:54 AM      Result Value Range   Sodium 142  135 - 145 mEq/L   Potassium 3.5  3.5 - 5.1 mEq/L   Chloride 107  96 - 112 mEq/L   CO2 24  19 - 32 mEq/L   Glucose, Bld 126 (*) 70 - 99 mg/dL   BUN 16  6 - 23 mg/dL   Creatinine, Ser 7.82  0.50 - 1.35 mg/dL   Calcium 9.3  8.4 - 95.6 mg/dL   Total Protein 7.4  6.0 - 8.3 g/dL   Albumin 3.8  3.5 - 5.2 g/dL   AST 58 (*) 0 - 37 U/L   ALT 46  0 - 53 U/L   Alkaline Phosphatase 68  39 - 117 U/L   Total Bilirubin 1.0  0.3 - 1.2 mg/dL   GFR calc non Af Amer >90  >90 mL/min   GFR calc Af Amer >90  >  90 mL/min   Comment:            The eGFR has been calculated     using the CKD EPI equation.     This calculation has not been     validated in all clinical     situations.     eGFR's persistently     <90 mL/min  signify     possible Chronic Kidney Disease.  AMMONIA     Status: None   Collection Time    09/11/12  4:54 AM      Result Value Range   Ammonia 49  11 - 60 umol/L  URINALYSIS, ROUTINE W REFLEX MICROSCOPIC     Status: Abnormal   Collection Time    09/11/12  4:54 AM      Result Value Range   Color, Urine AMBER (*) YELLOW   Comment: BIOCHEMICALS MAY BE AFFECTED BY COLOR   APPearance CLEAR  CLEAR   Specific Gravity, Urine 1.026  1.005 - 1.030   pH 6.0  5.0 - 8.0   Glucose, UA NEGATIVE  NEGATIVE mg/dL   Hgb urine dipstick NEGATIVE  NEGATIVE   Bilirubin Urine NEGATIVE  NEGATIVE   Ketones, ur NEGATIVE  NEGATIVE mg/dL   Protein, ur NEGATIVE  NEGATIVE mg/dL   Urobilinogen, UA 1.0  0.0 - 1.0 mg/dL   Nitrite NEGATIVE  NEGATIVE   Leukocytes, UA NEGATIVE  NEGATIVE   Comment: MICROSCOPIC NOT DONE ON URINES WITH NEGATIVE PROTEIN, BLOOD, LEUKOCYTES, NITRITE, OR GLUCOSE <1000 mg/dL.  ETHANOL     Status: None   Collection Time    09/11/12  4:54 AM      Result Value Range   Alcohol, Ethyl (B) <11  0 - 11 mg/dL   Comment:            LOWEST DETECTABLE LIMIT FOR     SERUM ALCOHOL IS 11 mg/dL     FOR MEDICAL PURPOSES ONLY  URINE RAPID DRUG SCREEN (HOSP PERFORMED)     Status: None   Collection Time    09/11/12  4:55 AM      Result Value Range   Opiates NONE DETECTED  NONE DETECTED   Cocaine NONE DETECTED  NONE DETECTED   Benzodiazepines NONE DETECTED  NONE DETECTED   Amphetamines NONE DETECTED  NONE DETECTED   Tetrahydrocannabinol NONE DETECTED  NONE DETECTED   Barbiturates NONE DETECTED  NONE DETECTED   Comment:            DRUG SCREEN FOR MEDICAL PURPOSES     ONLY.  IF CONFIRMATION IS NEEDED     FOR ANY PURPOSE, NOTIFY LAB     WITHIN 5 DAYS.                LOWEST DETECTABLE LIMITS     FOR URINE DRUG SCREEN     Drug Class       Cutoff (ng/mL)     Amphetamine      1000     Barbiturate      200     Benzodiazepine   200     Tricyclics       300     Opiates          300     Cocaine           300     THC              50   Psychological Evaluations:  Assessment:   AXIS  I:  Delusional disorder with psychotic features AXIS II:  Deferred AXIS III:   Past Medical History  Diagnosis Date  . Thrombocytopenia   . Cirrhosis   . Hepatitis C   . Hypertension   . Anxiety   . Anemia   . Blood transfusion   . Obesity   . Pancreatitis    AXIS IV:  Substance abuse issues, Marital problems/separation. AXIS V:  21-30 behavior considerably influenced by delusions or hallucinations OR serious impairment in judgment, communication OR inability to function in almost all areas  Treatment Plan/Recommendations: 1. Admit for crisis management and stabilization, estimated length of stay 3-5 days.  2. Medication management to reduce current symptoms to base line and improve the patient's overall level of functioning (a). Discontinued Amantadine, patient's home medication. 3. Treat health problems as indicated.  4. Develop treatment plan to decrease risk of relapse upon discharge and the need for readmission.  5. Psycho-social education regarding relapse prevention and self care.  6. Health care follow up as needed for medical problems.  7. Review, reconcile, and reinstate any pertinent home medications for other health issues where appropriate. 8. Call for consults with hospitalist for any additional specialty patient care services as needed.  Treatment Plan Summary: Daily contact with patient to assess and evaluate symptoms and progress in treatment Medication management Supportive approach/coping skills/relapse prevention Reassess and address the co morbidities Current Medications:  Current Facility-Administered Medications  Medication Dose Route Frequency Provider Last Rate Last Dose  . acetaminophen (TYLENOL) tablet 650 mg  650 mg Oral Q6H PRN Shuvon Rankin, NP      . alum & mag hydroxide-simeth (MAALOX/MYLANTA) 200-200-20 MG/5ML suspension 30 mL  30 mL Oral Q4H PRN Shuvon Rankin,  NP      . ARIPiprazole (ABILIFY) tablet 2 mg  2 mg Oral BID Shuvon Rankin, NP   2 mg at 09/12/12 1610  . FLUoxetine (PROZAC) capsule 20 mg  20 mg Oral Daily Shuvon Rankin, NP   20 mg at 09/12/12 9604  . hydrochlorothiazide (MICROZIDE) capsule 12.5 mg  12.5 mg Oral Daily Rachael Fee, MD   12.5 mg at 09/12/12 5409  . hydrOXYzine (ATARAX/VISTARIL) tablet 50 mg  50 mg Oral QHS,MR X 1 Shuvon Rankin, NP   50 mg at 09/11/12 2149  . lactulose (CHRONULAC) 10 GM/15ML solution 20 g  20 g Oral QHS Shuvon Rankin, NP   20 g at 09/11/12 2148  . lisinopril (PRINIVIL,ZESTRIL) tablet 20 mg  20 mg Oral Daily Shuvon Rankin, NP   20 mg at 09/12/12 8119  . magnesium hydroxide (MILK OF MAGNESIA) suspension 30 mL  30 mL Oral Daily PRN Shuvon Rankin, NP      . traZODone (DESYREL) tablet 100 mg  100 mg Oral QHS PRN Shuvon Rankin, NP   100 mg at 09/12/12 0117    Observation Level/Precautions:  15 minute checks  Laboratory:  Reviewed ED lab findings on file  Psychotherapy:  Group sessions  Medications:  See medication lists  Consultations:  As needed  Discharge Concerns: Safety   Estimated LOS: 3-5 days  Other:     I certify that inpatient services furnished can reasonably be expected to improve the patient's condition.   Armandina Stammer I 4/28/20149:19 AM

## 2012-09-12 NOTE — Progress Notes (Signed)
Pt reports he is doing better this evening.  He says he is still hearing voices, but they are decreasing.  He denies SI/HI.  He had been lying in bed when the shift began, but he voiced no complaints.  He is having no withdrawal symptoms, but he had been at [facility] before this admission.  Pt is unsure when he is discharging.  Support and encouragement offered.  Safety maintained with q15 minute checks.

## 2012-09-12 NOTE — [facility] Group Notes (Signed)
[facility] LCSW Group Therapy  09/12/2012 1:15 PM  Type of Therapy:  Group Therapy 1:15 to 2:30 PM  Participation Level:  Did Not Attend    [name] [name]

## 2012-09-12 NOTE — Tx Team (Signed)
Interdisciplinary Treatment Plan Update (Adult)  Date: 09/12/2012  Time Reviewed: 9:57 AM   Progress in Treatment: Attending groups: Patient recently admitted, uncertain Participating in groups: Not as yet Taking medication as prescribed:  Yes Tolerating medication:  Yes Family/Significant othe contact made: Not as yet Patient understands diagnosis: Yes Discussing patient identified problems/goals with staff: Yes Medical problems stabilized or resolved:  Yes Denies suicidal/homicidal ideation: Reportedly experienced over weekend Patient has not harmed self or Others: Yes  New problem(s) identified: None Identified  Discharge Plan or Barriers:  CSW is assessing to see if patient can return to [facility]  Additional comments: N/A  Reason for Continuation of Hospitalization: Anxiety Depression Medication stabilization Hallucinations   Estimated length of stay: 5 days  For review of initial/current patient goals, please see plan of care.  Attendees: Patient:     Family:     Physician:  Geoffery Lyons 09/12/2012 9:57 AM   Nursing:   Roswell Miners, RN 09/12/2012 9:57 AM   Clinical Social Worker Ronda Fairly 09/12/2012 9:57 AM   Other:  Dellia Cloud, RN 09/12/2012 9:57 AM   Other: Liliane Bade, Community Care Coordinator 09/12/2012 9:57 AM  Other: Dellia Cloud, RN 09/12/2012 9:57 AM  Other: Dorinda Hill, [place] PA Student 09/12/2012 9:57 AM  Other:  Dahlia Byes, PA 09/12/2012 9:57 AM   Other:   09/12/2012 9:57 AM   Other:   09/12/2012 9:57 AM    Scribe for Treatment Team:   Carney Bern, LCSWA  09/12/2012 9:57 AM

## 2012-09-12 NOTE — [facility] Group Notes (Signed)
[facility] LCSW Aftercare Discharge Planning Group Note   09/12/2012 8:45 AM   Participation Quality:  Minimal Attentive, before leaving group to meet with doctor.   Mood/Affect:  Flat   [name], [name] Payer

## 2012-09-12 NOTE — Progress Notes (Signed)
Pt attended AA group in its entirety; pt appeared attentive, affect-flat.

## 2012-09-12 NOTE — Progress Notes (Signed)
Adult Psychoeducational Group Note  Date:  09/12/2012 Time:  11:52 AM  Group Topic/Focus:  Coping With Mental Health Crisis:   The purpose of this group is to help patients identify strategies for coping with mental health crisis.  Group discusses possible causes of crisis and ways to manage them effectively.  Participation Level:  Minimal  Participation Quality:  Drowsy  Affect:  Appropriate  Cognitive:  Oriented  Insight: Good  Engagement in Group:  Engaged  Modes of Intervention:  Activity  Additional Comments: Pt goal for the day is to read in his big book.  Irania Durell T 09/12/2012, 11:52 AM

## 2012-09-13 DIAGNOSIS — F1020 Alcohol dependence, uncomplicated: Secondary | ICD-10-CM

## 2012-09-13 DIAGNOSIS — F1110 Opioid abuse, uncomplicated: Secondary | ICD-10-CM

## 2012-09-13 DIAGNOSIS — F1410 Cocaine abuse, uncomplicated: Secondary | ICD-10-CM

## 2012-09-13 MED ORDER — HALOPERIDOL 1 MG PO TABS
ORAL_TABLET | ORAL | Status: AC
  Administered 2012-09-13: 2 mg via ORAL
  Filled 2012-09-13: qty 2

## 2012-09-13 MED ORDER — BENZTROPINE MESYLATE 1 MG PO TABS
1.0000 mg | ORAL_TABLET | Freq: Two times a day (BID) | ORAL | Status: DC | PRN
  Filled 2012-09-13: qty 8

## 2012-09-13 MED ORDER — HALOPERIDOL 2 MG PO TABS
2.0000 mg | ORAL_TABLET | Freq: Once | ORAL | Status: AC
  Filled 2012-09-13: qty 1

## 2012-09-13 MED ORDER — HALOPERIDOL 2 MG PO TABS
2.0000 mg | ORAL_TABLET | Freq: Three times a day (TID) | ORAL | Status: DC
  Administered 2012-09-13 – 2012-09-16 (×10): 2 mg via ORAL
  Filled 2012-09-13 (×12): qty 1

## 2012-09-13 NOTE — Progress Notes (Signed)
Cordelle Dahmen is the wife of [name] [name] and they are legally seperated; consent has been given to talk to her about the treatment; she is concerned that he has been placed on Abilify and that he can not afford that and that he has medicare and not medicaid; patient also states " he cant come back here because I work and I cant take care of him"; also patient was concerned about him coming home because " Johney called me and said he was getting discharged and I want to be able to talk to someone about his care after discharge because he needs medical treatment as well because of his hepatitis C; Child psychotherapist and physician have been made aware

## 2012-09-13 NOTE — Progress Notes (Signed)
Pt has been in the bed all evening asleep.  At med pass time, pt was awakened to get his meds.  He reported that he was not hearing voices at this time.  He was still drowsy.  He voiced no complaints.  He took his meds without incident.  Pt was encouraged to make his needs known to staff.  Support and encouragement offered.  Safety maintained with q15 minute checks.

## 2012-09-13 NOTE — Progress Notes (Signed)
[facility] LCSW Group Therapy  09/13/2012 1:15 PM  Type of Therapy:  Group Therapy 1:15 to 2:30 PM  Participation Level:  Minimal, as he was present for group  Participation Quality:  Drowsy and Inattentive  Affect:  Not Congruent  Cognitive:  Delusional, patient was obviously responding to internal stimuli. Hand movements were frequent and at one point patient abruptly rose and approached CSW.   Insight:  None  Engagement in Therapy:  None  Modes of Intervention:  Discussion, Socialization and Support  Summary of Progress/Problems: Patient attended group presentation by staff member of  [employer] [facility] [employer] ([facility]). Verdell was attentive and responding to internal stimuli during session.   [name]

## 2012-09-13 NOTE — Progress Notes (Signed)
D: Patient denies SI/HI and auditory hallucinations and admits to visual hallucinations; patient reports that he is seeing a man placing tiles in the middle of the group, reports seeing a woman and reports seeing a dog; patient would not tell me would not go into detail and he stated " I know they are not there" but he does seem to react to some of the hallucinations; patient has reacted to seeing his dog and trying to clean up after it; patient also reported that his sleep was restless and his was seeing things during the night and hearing voices but patient denies that they are commanding him to do anything  A: Monitored q 15 minutes; patient encouraged to attend groups; patient educated about medications; patient given medications per physician orders; patient encouraged to express feelings and/or concerns  R: Patient is cooperative and pleasant ; patient's interaction with staff and peers is appropriate; patient was able to set goal to talk with staff 1:1 when seeing or hearing things; patient is taking medications as prescribed and tolerating medications; patient is attending all groups

## 2012-09-13 NOTE — Progress Notes (Signed)
Pt has been up twice tonight, the first time he came to the nurse's station saying he had called to order pizza and then said he was looking for a woman he saw down the hall.  He told the [facility] that a man in uniform had come into his room.  The second time he got up and was standing over his roommate staring at him.  The roommate woke up and was frightened by the pt.  The pt was moved to the quiet room for the rest of the night.

## 2012-09-13 NOTE — Progress Notes (Signed)
Recreation Therapy Notes  Date: 04.29.2014 Time: 3:00pm Location: [address] Day Room      Group Topic/Focus: Communication, Problem Solving, Leisure Edcuation  Participation Level: Did not attend per [facility] patient at ED being examined.   Marykay Lex Michelyn Scullin, LRT/CTRS  Jearl Klinefelter 09/13/2012 4:54 PM

## 2012-09-13 NOTE — Progress Notes (Signed)
Order clarified with the physician Dr. Dub Mikes; order was received to given a one time dose of Haldol 2mg  and the three preceding doses of Haldol and at the times designated; physician was made aware that he did also receive Abilify 2mg  at 0800 and orders were given to proceed as ordered

## 2012-09-13 NOTE — Progress Notes (Signed)
Adult Psychoeducational Group Note  Date:  09/13/2012 Time:  11:25 AM  Group Topic/Focus:  Recovery Goals:   The focus of this group is to identify appropriate goals for recovery and establish a plan to achieve them.  Participation Level:  Did Not Attend  Participation Quality:    Affect:    Cognitive:    Insight:   Engagement in Group:    Modes of Intervention:    Additional Comments:  Pt was in the bed, pt has not attended any groups this am.  Reighlyn Elmes M 09/13/2012, 11:25 AM

## 2012-09-13 NOTE — Progress Notes (Signed)
[facility] MD Progress Note  09/13/2012 4:52 PM [name] [name]  MRN:  [id number] Subjective:  [name] [name] has been having some hallucinations, misperceptions, and delusional ideas. He could not afford the Abilify before so he wants not to be placed back on it. He has some insight in terms of these psychotic symptoms, but for the most part he has not. The UDS is negative and he was at a rehab before he came here so withdrawal symptoms are not being entertained.  Diagnosis:  Psychotic Disorder NOS, alcohol dependence, cocaine, opioid abuse  ADL's:  Intact  Sleep: Poor  Appetite:  Fair  Suicidal Ideation:  Plan:  denies Intent:  denies Means:  denies Homicidal Ideation:  Plan:  denies Intent:  denies Means:  denies AEB (as evidenced by):  Psychiatric Specialty Exam: Review of Systems  Constitutional: Negative.   HENT: Negative.   Eyes: Negative.   Respiratory: Negative.   Cardiovascular: Negative.   Gastrointestinal: Negative.   Genitourinary: Negative.   Musculoskeletal: Negative.   Skin: Negative.   Neurological: Negative.   Endo/Heme/Allergies: Negative.   Psychiatric/Behavioral: Positive for depression, hallucinations and substance abuse. The patient is nervous/anxious.     Blood pressure 117/79, pulse 79, temperature 97.5 F (36.4 C), temperature source Oral, resp. rate 16, height 6' 2.5" (1.892 m), weight 130.636 kg (288 lb).Body mass index is 36.49 kg/(m^2).  General Appearance: Fairly Groomed  Patent attorney::  Fair  Speech:  Clear and Coherent and Slow  Volume:  Decreased  Mood:  Anxious, Depressed and worried  Affect:  Restricted  Thought Process:  Coherent and Goal Directed  Orientation:  Full (Time, Place, and Person)  Thought Content:  Delusions and Hallucinations: Auditory Visual  Suicidal Thoughts:  No  Homicidal Thoughts:  No  Memory:  Immediate;   Fair Recent;   Fair Remote;   Fair  Judgement:  Impaired  Insight:  Shallow  Psychomotor Activity:  Restlessness   Concentration:  Fair  Recall:  Fair  Akathisia:  No  Handed:  Right  AIMS (if indicated):     Assets:  Desire for Improvement  Sleep:  Number of Hours: 2.25   Current Medications: Current Facility-Administered Medications  Medication Dose Route Frequency Provider Last Rate Last Dose  . acetaminophen (TYLENOL) tablet 650 mg  650 mg Oral Q6H PRN Shuvon Rankin, NP      . alum & mag hydroxide-simeth (MAALOX/MYLANTA) 200-200-20 MG/5ML suspension 30 mL  30 mL Oral Q4H PRN Shuvon Rankin, NP      . benztropine (COGENTIN) tablet 1 mg  1 mg Oral BID PRN Rachael Fee, MD      . FLUoxetine (PROZAC) capsule 20 mg  20 mg Oral Daily Shuvon Rankin, NP   20 mg at 09/13/12 0813  . haloperidol (HALDOL) tablet 2 mg  2 mg Oral TID Rachael Fee, MD   2 mg at 09/13/12 1312  . hydrochlorothiazide (MICROZIDE) capsule 12.5 mg  12.5 mg Oral Daily Rachael Fee, MD   12.5 mg at 09/13/12 0813  . hydrOXYzine (ATARAX/VISTARIL) tablet 50 mg  50 mg Oral QHS,MR X 1 Shuvon Rankin, NP   50 mg at 09/12/12 2137  . lactulose (CHRONULAC) 10 GM/15ML solution 20 g  20 g Oral QHS Shuvon Rankin, NP   20 g at 09/12/12 2136  . lisinopril (PRINIVIL,ZESTRIL) tablet 20 mg  20 mg Oral Daily Shuvon Rankin, NP   20 mg at 09/13/12 0813  . magnesium hydroxide (MILK OF MAGNESIA) suspension 30 mL  30 mL Oral  Daily PRN Shuvon Rankin, NP      . OXcarbazepine (TRILEPTAL) tablet 150 mg  150 mg Oral BID Sanjuana Kava, NP   150 mg at 09/13/12 0813  . traZODone (DESYREL) tablet 100 mg  100 mg Oral QHS PRN Shuvon Rankin, NP   100 mg at 09/12/12 2137    Lab Results: No results found for this or any previous visit (from the past 48 hour(s)).  Physical Findings: AIMS: Facial and Oral Movements Muscles of Facial Expression: None, normal Lips and Perioral Area: None, normal Jaw: None, normal Tongue: None, normal,Extremity Movements Upper (arms, wrists, hands, fingers): None, normal Lower (legs, knees, ankles, toes): None, normal, Trunk  Movements Neck, shoulders, hips: None, normal, Overall Severity Severity of abnormal movements (highest score from questions above): None, normal Incapacitation due to abnormal movements: None, normal Patient's awareness of abnormal movements (rate only patient's report): No Awareness, Dental Status Current problems with teeth and/or dentures?: No Does patient usually wear dentures?: No  CIWA:  CIWA-Ar Total: 0 COWS:  COWS Total Score: 0  Treatment Plan Summary: Daily contact with patient to assess and evaluate symptoms and progress in treatment Medication management  Plan: Supportive approach/coping skills/relapse prevention           Reassess psychotic symptoms           Trial with Haldol  Medical Decision Making Problem Points:  Review of psycho-social stressors (1) Data Points:  Review of medication regiment & side effects (2)  I certify that inpatient services furnished can reasonably be expected to improve the patient's condition.   Zaiya Annunziato A 09/13/2012, 4:52 PM

## 2012-09-13 NOTE — [facility] Group Notes (Signed)
[facility] LCSW Aftercare Discharge Planning Group Note   09/13/2012 8:45 AM  Participation Quality:  Patient was present and sharing although clearly experiencing audio and visual hallucinations   Mood/Affect:  Not Congruent  Depression Rating:  5  Anxiety Rating:  9  Thoughts of Suicide:  No Will you contract for safety?   NA  Current AVH:  Yes  Plan for Discharge/Comments:  Uncertain at this time, patient gave consent for CSW to contact both [place] and wife from whom he is seperated  Transportation Means: Uncertain  Supports: Estranged wife as minimal support  [place], [name] [name]

## 2012-09-14 DIAGNOSIS — F1920 Other psychoactive substance dependence, uncomplicated: Secondary | ICD-10-CM

## 2012-09-14 DIAGNOSIS — F29 Unspecified psychosis not due to a substance or known physiological condition: Secondary | ICD-10-CM

## 2012-09-14 NOTE — Progress Notes (Signed)
Patient ID: [name] [name], male   DOB: 1960-06-06, 52 y.o.   MRN: [id number] [facility] MD Progress Note  09/14/2012 11:59 AM [name] [name]  MRN:  [id number]  Subjective:  [name] [name] was assessed while he is lying in bed with eyes closed. States that he is tired, Says he does not know if he okay today.Reports that he was not able to attend group sessions this am, but able to go to breakfast this morning. Denies any hallucinations today Denies SIHI.  ADL's:  Intact  Sleep: Poor  Appetite:  Fair  Suicidal Ideation:  Plan:  denies Intent:  denies Means:  denies Homicidal Ideation:  Plan:  denies Intent:  denies Means:  denies  AEB (as evidenced by): Per patient's reports.  Psychiatric Specialty Exam: Review of Systems  Constitutional: Negative.   HENT: Negative.   Eyes: Negative.   Respiratory: Negative.   Cardiovascular: Positive for orthopnea.  Gastrointestinal: Negative.   Genitourinary: Negative.   Musculoskeletal: Negative.   Skin: Negative.   Neurological: Negative.   Endo/Heme/Allergies: Negative.   Psychiatric/Behavioral: Positive for depression, hallucinations and substance abuse. The patient is nervous/anxious.     Blood pressure 115/74, pulse 99, temperature 98.3 F (36.8 C), temperature source Oral, resp. rate 20, height 6' 2.5" (1.892 m), weight 130.636 kg (288 lb).Body mass index is 36.49 kg/(m^2).  General Appearance: Fairly Groomed  Patent attorney::  Fair  Speech:  Clear and Coherent and Slow  Volume:  Decreased  Mood:  Anxious, Depressed and worried  Affect:  Restricted  Thought Process:  Coherent and Goal Directed  Orientation:  Full (Time, Place, and Person)  Thought Content:  Delusions and Hallucinations: Auditory Visual  Suicidal Thoughts:  No  Homicidal Thoughts:  No  Memory:  Immediate;   Fair Recent;   Fair Remote;   Fair  Judgement:  Impaired  Insight:  Shallow  Psychomotor Activity:  Restlessness  Concentration:  Fair  Recall:  Fair  Akathisia:  No   Handed:  Right  AIMS (if indicated):     Assets:  Desire for Improvement  Sleep:  Number of Hours: 5.5   Current Medications: Current Facility-Administered Medications  Medication Dose Route Frequency Provider Last Rate Last Dose  . acetaminophen (TYLENOL) tablet 650 mg  650 mg Oral Q6H PRN Shuvon Rankin, NP      . alum & mag hydroxide-simeth (MAALOX/MYLANTA) 200-200-20 MG/5ML suspension 30 mL  30 mL Oral Q4H PRN Shuvon Rankin, NP      . benztropine (COGENTIN) tablet 1 mg  1 mg Oral BID PRN Rachael Fee, MD      . FLUoxetine (PROZAC) capsule 20 mg  20 mg Oral Daily Shuvon Rankin, NP   20 mg at 09/14/12 4132  . haloperidol (HALDOL) tablet 2 mg  2 mg Oral TID Rachael Fee, MD   2 mg at 09/14/12 4401  . hydrochlorothiazide (MICROZIDE) capsule 12.5 mg  12.5 mg Oral Daily Rachael Fee, MD   12.5 mg at 09/14/12 0272  . hydrOXYzine (ATARAX/VISTARIL) tablet 50 mg  50 mg Oral QHS,MR X 1 Shuvon Rankin, NP   50 mg at 09/13/12 2136  . lactulose (CHRONULAC) 10 GM/15ML solution 20 g  20 g Oral QHS Shuvon Rankin, NP   20 g at 09/13/12 2137  . lisinopril (PRINIVIL,ZESTRIL) tablet 20 mg  20 mg Oral Daily Shuvon Rankin, NP   20 mg at 09/14/12 0807  . magnesium hydroxide (MILK OF MAGNESIA) suspension 30 mL  30 mL Oral Daily PRN Shuvon Rankin, NP      .  OXcarbazepine (TRILEPTAL) tablet 150 mg  150 mg Oral BID Sanjuana Kava, NP   150 mg at 09/14/12 0807  . traZODone (DESYREL) tablet 100 mg  100 mg Oral QHS PRN Shuvon Rankin, NP   100 mg at 09/13/12 2137    Lab Results: No results found for this or any previous visit (from the past 48 hour(s)).  Physical Findings: AIMS: Facial and Oral Movements Muscles of Facial Expression: None, normal Lips and Perioral Area: None, normal Jaw: None, normal Tongue: None, normal,Extremity Movements Upper (arms, wrists, hands, fingers): None, normal Lower (legs, knees, ankles, toes): None, normal, Trunk Movements Neck, shoulders, hips: None, normal, Overall  Severity Severity of abnormal movements (highest score from questions above): None, normal Incapacitation due to abnormal movements: None, normal Patient's awareness of abnormal movements (rate only patient's report): No Awareness, Dental Status Current problems with teeth and/or dentures?: No Does patient usually wear dentures?: No  CIWA:  CIWA-Ar Total: 0 COWS:  COWS Total Score: 0  Treatment Plan Summary: Daily contact with patient to assess and evaluate symptoms and progress in treatment Medication management  Plan: Supportive approach/coping skills/relapse prevention Psychotic symptoms, stable today. Continue Haldol at 2 mg. Encourage group partcipation.  Medical Decision Making Problem Points:  Review of psycho-social stressors (1) Data Points:  Review of medication regiment & side effects (2)  I certify that inpatient services furnished can reasonably be expected to improve the patient's condition.   Armandina Stammer I 09/14/2012, 11:59 AM

## 2012-09-14 NOTE — Progress Notes (Signed)
[facility] LCSW Group Therapy  09/14/2012 1:15 PM  Type of Therapy:  Group Therapy 1:15  Participation Level:  Did not attend  [name] [name]

## 2012-09-14 NOTE — Progress Notes (Signed)
Interdisciplinary Treatment Plan Update (Adult)  Date: 09/14/2012  Time Reviewed: 9:57 AM   Progress in Treatment: Attending groups: No Participating in groups: No Taking medication as prescribed:  Yes Tolerating medication:  Yes Family/Significant othe contact made: Yes Patient understands diagnosis: Unknown Discussing patient identified problems/goals with staff: Yes Medical problems stabilized or resolved:  Yes Denies suicidal/homicidal ideation: Yes Patient has not harmed self or Others: Yes  New problem(s) identified: None Identified  Discharge Plan or Barriers:  CSW will be speaking with both patient and estranged wife regarding an ACT Team referral  Additional comments: N/A  Reason for Continuation of Hospitalization: Aggression Delusions  Depression Hallucinations Medical Issues Medication stabilization Suicidal ideation   Estimated length of stay: 5 days  For review of initial/current patient goals, please see plan of care.  Attendees: Patient:     Family:     Physician:  Geoffery Lyons 09/14/2012 9:57 AM   Nursing:   Roswell Miners, RN 09/14/2012 9:57 AM   Clinical Social Worker Ronda Fairly 09/14/2012 9:57 AM   Other:  Dorinda Hill, [place] PA Student 09/14/2012 9:57 AM   Other:  Jacolyn Reedy Psych Student 09/14/2012 9:57 AM   Other:  Serena Colonel, PA 09/14/2012 9:57 AM   Other:  Carney Living,  09/14/2012 9:57 AM    Scribe for Treatment Team:   Carney Bern, LCSWA  09/14/2012 9:57AM

## 2012-09-14 NOTE — Progress Notes (Signed)
[facility] LCSW Aftercare Discharge Planning Group Note   09/14/2012 8:45 AM  Participation Quality:  Did Not Attend   [place], Catherine Campbell    

## 2012-09-14 NOTE — Progress Notes (Signed)
Psychoeducational Group Note  Date:  09/14/2012 Time:  2000  Group Topic/Focus:  NA group  Participation Level: Did Not Attend  Participation Quality:  Not Applicable  Affect:  Not Applicable  Cognitive:  Not Applicable  Insight:  Not Applicable  Engagement in Group: Not Applicable  Additional Comments:    Flonnie Hailstone 09/14/2012, 9:32 PM

## 2012-09-14 NOTE — Progress Notes (Signed)
D: Patient denies SI/HI and A/V hallucinations; patient reports sleep is well and patient was in the bed most of the day; reports appetite is good; patient has denied all visual hallucinations in conversation and when he was sleeping  A: Monitored q 15 minutes; patient encouraged to attend groups; patient educated about medications; patient given medications per physician orders; patient encouraged to express feelings and/or concerns  R: Patient is cooperative and appropriate to circumstance; patient's interaction with staff and peers is minimal and isolative; ; patient is taking medications as prescribed and tolerating medications; patient did not attend all of the groups and patient reports he just wanted to rest

## 2012-09-15 NOTE — Progress Notes (Signed)
Recreation Therapy Notes  Date: 05.1.2014 Time: 3:00pm Location: [address] Day[address]      Group Topic/Focus: Leisure Education  Participation Level: Did not attend  Rosene Pilling L Titiana Severa, LRT/CTRS  Junious Ragone L 09/15/2012 5:04 PM 

## 2012-09-15 NOTE — [facility] Group Notes (Signed)
[facility] LCSW Aftercare Discharge Planning Group Note   09/15/2012 8:45 AM  Participation Quality:  Did not attend CSW met with patient in his room after group; patient in bed.  Pt would neither endorse or deny suicidal ideation but did agree to contract for safety.  On a scale of 1 to 10 with ten being the most ever experienced, the patient rated his depression at an 8  and anxiety at an 8.  Patient agreed to referral for ACT team. Patient did not accept news well that estranged wife is not comfortable with his returning to home. Patient is under belief that she cannot prevent him from staying there although the home is in her name. Patient is willing to met with treatment team tomorrow on 09/16/12 to discuss discharge options.   [name]

## 2012-09-15 NOTE — [facility] Group Notes (Addendum)
[facility] LCSW Group Therapy  09/15/2012 1:15 PM  Type of Therapy:  Group Therapy  Summary of Progress/Problems: CSW was involved in unexpected discharge of 3 additional patients today at time group was to begin. Multiple phone calls were required thus group was delayed. Attempted to start group at 1:45 and was only able to engage one patient.  Chirag choose not to attend.   [name]

## 2012-09-15 NOTE — Progress Notes (Signed)
D:  Patient's self inventory sheet, patient sleeps well, has poor appetite, low energy level, poor attention span.  Rate depression and hopelessness #8, anxiety #7.  Denied withdrawals.  SI off/on, contracts for safety.  Stated he has had hallucinations.  One night he thought roommate was stealing his money.  Voices, noises, people trying to put him in jail, police radios and police talking to him.  Denied visual and voices today.  Denied HI at this time.  Denied pain.  Not sure if he can afford meds after discharge.  Stated he is on disability for depression. A:  Medications administered per MD order.  Support and encouragement given throughout day. R:  Has been sleeping most of morning, stated he is very tired.  Denied HI at this time.  SI off/on, contracts for safety.  Denied A/V hallucinations at this time.   Will continue to monitor patient for safety with 15 minute checks.  Safety maintained.

## 2012-09-15 NOTE — Progress Notes (Signed)
[facility] MD Progress Note  09/15/2012 3:49 PM [name] [name]  MRN:  [id number] Subjective:  [name] [name] endorses that he is not experiencing the hallucinations as he was before. On the other hand he again ot ready to go as he was told he had to leave. (not reality based) He has been in bed most of the day, but has gotten up to go to the cafeteria. He is oriented X 3.  Diagnosis:  Psychotic Disorder NOS, Polysubstance Dependence  ADL's:  Intact  Sleep: Fair  Appetite:  Fair  Suicidal Ideation:  Plan:  denies Intent:  denies Means:  denies Homicidal Ideation:  Plan:  denies Intent:  denies Means:  denies AEB (as evidenced by):  Psychiatric Specialty Exam: Review of Systems  Constitutional: Negative.   HENT: Negative.   Eyes: Negative.   Respiratory: Negative.   Cardiovascular: Negative.   Gastrointestinal: Negative.   Genitourinary: Negative.   Musculoskeletal: Negative.   Skin: Negative.   Neurological: Negative.   Endo/Heme/Allergies: Negative.   Psychiatric/Behavioral: Positive for depression, hallucinations and substance abuse.    Blood pressure 122/80, pulse 71, temperature 98.1 F (36.7 C), temperature source Oral, resp. rate 18, height 6' 2.5" (1.892 m), weight 130.636 kg (288 lb).Body mass index is 36.49 kg/(m^2).  General Appearance: Disheveled  Eye Contact::  Minimal  Speech:  Clear and Coherent, Slow and not spontaneous  Volume:  Decreased  Mood:  Depressed  Affect:  Restricted  Thought Process:  Coherent and Goal Directed  Orientation:  Full (Time, Place, and Person)  Thought Content:  worries, concerns  Suicidal Thoughts:  No  Homicidal Thoughts:  No  Memory:  Immediate;   Fair Recent;   Fair Remote;   Fair  Judgement:  Fair  Insight:  Shallow  Psychomotor Activity:  Decreased  Concentration:  Fair  Recall:  Fair  Akathisia:  No  Handed:  Right  AIMS (if indicated):     Assets:  Desire for Improvement  Sleep:  Number of Hours: 6.75   Current  Medications: Current Facility-Administered Medications  Medication Dose Route Frequency Provider Last Rate Last Dose  . acetaminophen (TYLENOL) tablet 650 mg  650 mg Oral Q6H PRN Shuvon Rankin, NP      . alum & mag hydroxide-simeth (MAALOX/MYLANTA) 200-200-20 MG/5ML suspension 30 mL  30 mL Oral Q4H PRN Shuvon Rankin, NP      . benztropine (COGENTIN) tablet 1 mg  1 mg Oral BID PRN Rachael Fee, MD      . FLUoxetine (PROZAC) capsule 20 mg  20 mg Oral Daily Shuvon Rankin, NP   20 mg at 09/15/12 0818  . haloperidol (HALDOL) tablet 2 mg  2 mg Oral TID Rachael Fee, MD   2 mg at 09/15/12 1222  . hydrochlorothiazide (MICROZIDE) capsule 12.5 mg  12.5 mg Oral Daily Rachael Fee, MD   12.5 mg at 09/15/12 7829  . hydrOXYzine (ATARAX/VISTARIL) tablet 50 mg  50 mg Oral QHS,MR X 1 Shuvon Rankin, NP   50 mg at 09/13/12 2136  . lactulose (CHRONULAC) 10 GM/15ML solution 20 g  20 g Oral QHS Shuvon Rankin, NP   20 g at 09/13/12 2137  . lisinopril (PRINIVIL,ZESTRIL) tablet 20 mg  20 mg Oral Daily Shuvon Rankin, NP   20 mg at 09/15/12 0825  . magnesium hydroxide (MILK OF MAGNESIA) suspension 30 mL  30 mL Oral Daily PRN Shuvon Rankin, NP      . OXcarbazepine (TRILEPTAL) tablet 150 mg  150 mg Oral BID Nicole Kindred  I Nwoko, NP   150 mg at 09/15/12 0825  . traZODone (DESYREL) tablet 100 mg  100 mg Oral QHS PRN Shuvon Rankin, NP   100 mg at 09/13/12 2137    Lab Results: No results found for this or any previous visit (from the past 48 hour(s)).  Physical Findings: AIMS: Facial and Oral Movements Muscles of Facial Expression: None, normal Lips and Perioral Area: None, normal Jaw: None, normal Tongue: None, normal,Extremity Movements Upper (arms, wrists, hands, fingers): None, normal Lower (legs, knees, ankles, toes): None, normal, Trunk Movements Neck, shoulders, hips: None, normal, Overall Severity Severity of abnormal movements (highest score from questions above): None, normal Incapacitation due to abnormal  movements: None, normal Patient's awareness of abnormal movements (rate only patient's report): No Awareness, Dental Status Current problems with teeth and/or dentures?: No Does patient usually wear dentures?: No  CIWA:  CIWA-Ar Total: 0 COWS:  COWS Total Score: 0  Treatment Plan Summary: Daily contact with patient to assess and evaluate symptoms and progress in treatment Medication management  Plan: Supportive approach/coping skills/relapse prevention           Continue to address the psychotic symptoms            Optimize treatment with Haldol  Medical Decision Making Problem Points:  Review of psycho-social stressors (1) Data Points:  Review of medication regiment & side effects (2)  I certify that inpatient services furnished can reasonably be expected to improve the patient's condition.   [name] [name] 09/15/2012, 3:49 PM

## 2012-09-15 NOTE — Progress Notes (Signed)
Pt has been asleep since the beginning of the shift.  Resting with eyes closed.  No distress observed.  Respirations even and unlabored.  Pt was started on Haldol and has been sedated beginning the medications.  Safety is maintained with q15 minute checks.

## 2012-09-16 MED ORDER — BENZTROPINE MESYLATE 0.5 MG PO TABS: 0.5000 mg | ORAL_TABLET | Freq: Two times a day (BID) | ORAL | Status: DC | PRN

## 2012-09-16 MED ORDER — HALOPERIDOL 2 MG PO TABS
3.0000 mg | ORAL_TABLET | Freq: Three times a day (TID) | ORAL | Status: DC
  Administered 2012-09-16 – 2012-09-17 (×2): 3 mg via ORAL
  Filled 2012-09-16 (×5): qty 1

## 2012-09-16 NOTE — [facility] Group Notes (Signed)
[facility] LCSW Aftercare Discharge Planning Group Note   09/16/2012 8:45 AM  Participation Quality:  Appropriate  Mood/Affect:  Blunted and Flat  Depression Rating:  8  Anxiety Rating:  8  Thoughts of Suicide:  No Will you contract for safety?   Yes, should thoughts arise  Current AVH:  No  Plan for Discharge/Comments:  ACT team to help with medication management and explore halfway house opportunities  Transportation Means:  Bus  Supports: Minimal, wife is estranged  Haematologist, [name] [name]

## 2012-09-16 NOTE — Progress Notes (Signed)
[facility] LCSW Group Therapy  09/16/2012 1:15 PM  Type of Therapy:  Group Therapy 1:15 to 2:30 PM  Participation Level:  Active  Participation Quality:  Appropriate  Affect:  Depressed and Flat  Cognitive:  Alert and Oriented  Insight:  Improving  Engagement in Therapy:  Improving  Modes of Intervention:  Discussion, Exploration, Socialization and Support  Summary of Progress/Problems: Focus of group processing discussion was on balance in life; the components in life which have a negative influence on balance and the components which make for a more balanced life.  Patient shared with group for first time his situation of "not being able to return home after exhausting the wife and her mother. I drank for years and then stopped so you couldn't smell it and started using pills. It's been hell for all of [place]."  Patient received support from others in group.   [name]

## 2012-09-16 NOTE — Progress Notes (Signed)
Patient ID: [name] [name], male   DOB: 04/03/1961, 52 y.o.   MRN: [id number] [facility] MD Progress Note  09/16/2012 12:39 PM [name] [name]  MRN:  [id number]  Subjective:  [name] [name] attended treatment team meeting this am. His reason for admission and present symptoms discussed. His living situation after discharge discussed as it is likely he will not be returning home to his wife. [name] [name] continue to endorse hallucinations. States the voices are now mumbling and he could not make out what they were saying. He argued if he has any right to return to the home that he was sharing with wife for 10 years. Also wondered by law if his wife could ask for him to not return to the house. He did embrace the fact that he may not be going home after discharge after all. He appears disappointed and yet subdued. Is open to check out other options of housing possiblities.  [name] [name] denies having any bad feelings and or animosity towards his wife over her decision to not allow him to return home. He did voice his disappointment as well.   Diagnosis:  Psychotic Disorder NOS, Polysubstance Dependence  ADL's:  Intact  Sleep: Fair  Appetite:  Fair  Suicidal Ideation:  Plan:  denies Intent:  denies Means:  denies Homicidal Ideation:  Plan:  denies Intent:  denies Means:  denies  AEB (as evidenced by): Per patient's reports.  Psychiatric Specialty Exam: Review of Systems  Constitutional: Negative.   HENT: Negative.   Eyes: Negative.   Respiratory: Negative.   Cardiovascular: Negative.   Gastrointestinal: Negative.   Genitourinary: Negative.   Musculoskeletal: Negative.   Skin: Negative.   Neurological: Negative.   Endo/Heme/Allergies: Negative.   Psychiatric/Behavioral: Positive for depression, hallucinations and substance abuse.    Blood pressure 118/83, pulse 89, temperature 98.4 F (36.9 C), temperature source Oral, resp. rate 18, height 6' 2.5" (1.892 m), weight 130.636 kg (288 lb).Body mass index is 36.49  kg/(m^2).  General Appearance: Disheveled  Eye Contact::  Minimal  Speech:  Clear and Coherent, Slow and not spontaneous  Volume:  Decreased  Mood:  Depressed  Affect:  Restricted  Thought Process:  Coherent and Goal Directed  Orientation:  Full (Time, Place, and Person)  Thought Content:  worries, concerns  Suicidal Thoughts:  No  Homicidal Thoughts:  No  Memory:  Immediate;   Fair Recent;   Fair Remote;   Fair  Judgement:  Fair  Insight:  Shallow  Psychomotor Activity:  Decreased  Concentration:  Fair  Recall:  Fair  Akathisia:  No  Handed:  Right  AIMS (if indicated):     Assets:  Desire for Improvement  Sleep:  Number of Hours: 6.5   Current Medications: Current Facility-Administered Medications  Medication Dose Route Frequency Provider Last Rate Last Dose  . acetaminophen (TYLENOL) tablet 650 mg  650 mg Oral Q6H PRN Shuvon Rankin, NP      . alum & mag hydroxide-simeth (MAALOX/MYLANTA) 200-200-20 MG/5ML suspension 30 mL  30 mL Oral Q4H PRN Shuvon Rankin, NP      . benztropine (COGENTIN) tablet 1 mg  1 mg Oral BID PRN Rachael Fee, MD      . FLUoxetine (PROZAC) capsule 20 mg  20 mg Oral Daily Shuvon Rankin, NP   20 mg at 09/16/12 0814  . haloperidol (HALDOL) tablet 2 mg  2 mg Oral TID Rachael Fee, MD   2 mg at 09/16/12 1209  . hydrochlorothiazide (MICROZIDE) capsule 12.5 mg  12.5 mg  Oral Daily Rachael Fee, MD   12.5 mg at 09/16/12 0813  . hydrOXYzine (ATARAX/VISTARIL) tablet 50 mg  50 mg Oral QHS,MR X 1 Shuvon Rankin, NP   50 mg at 09/15/12 2154  . lactulose (CHRONULAC) 10 GM/15ML solution 20 g  20 g Oral QHS Shuvon Rankin, NP   20 g at 09/15/12 2154  . lisinopril (PRINIVIL,ZESTRIL) tablet 20 mg  20 mg Oral Daily Shuvon Rankin, NP   20 mg at 09/16/12 0813  . magnesium hydroxide (MILK OF MAGNESIA) suspension 30 mL  30 mL Oral Daily PRN Shuvon Rankin, NP      . OXcarbazepine (TRILEPTAL) tablet 150 mg  150 mg Oral BID Sanjuana Kava, NP   150 mg at 09/16/12 0814  .  traZODone (DESYREL) tablet 100 mg  100 mg Oral QHS PRN Shuvon Rankin, NP   100 mg at 09/15/12 2154    Lab Results: No results found for this or any previous visit (from the past 48 hour(s)).  Physical Findings: AIMS: Facial and Oral Movements Muscles of Facial Expression: None, normal Lips and Perioral Area: None, normal Jaw: None, normal Tongue: None, normal,Extremity Movements Upper (arms, wrists, hands, fingers): None, normal Lower (legs, knees, ankles, toes): None, normal, Trunk Movements Neck, shoulders, hips: None, normal, Overall Severity Severity of abnormal movements (highest score from questions above): None, normal Incapacitation due to abnormal movements: None, normal Patient's awareness of abnormal movements (rate only patient's report): No Awareness, Dental Status Current problems with teeth and/or dentures?: No Does patient usually wear dentures?: No  CIWA:  CIWA-Ar Total: 0 COWS:  COWS Total Score: 0  Treatment Plan Summary: Daily contact with patient to assess and evaluate symptoms and progress in treatment Medication management  Plan: Supportive approach/coping skills/relapse prevention Continue to address the psychotic symptoms Optimize treatment with Haldol, increase from 2 mg to 3 mg  Bedtime for hallucinations.   Medical Decision Making Problem Points:  Review of psycho-social stressors (1) Data Points:  Review of medication regiment & side effects (2)  I certify that inpatient services furnished can reasonably be expected to improve the patient's condition.   Armandina Stammer I 09/16/2012, 12:39 PM

## 2012-09-16 NOTE — Tx Team (Signed)
Interdisciplinary Treatment Plan Update (Adult)  Date: 09/16/2012  Time Reviewed: 9:45 AM   Progress in Treatment: Attending groups: Not as yet due to internal stimuli Participating in groups: Not as yet Taking medication as prescribed:  Yes Tolerating medication:  Yes Family/Significant othe contact made: Yes Patient understands diagnosis: Yes Discussing patient identified problems/goals with staff: Yes Medical problems stabilized or resolved:  Yes Denies suicidal/homicidal ideation: NO on 09/15/12 Patient has not harmed self or Others: Yes  New problem(s) identified: None Identified  Discharge Plan or Barriers:  CSW to refer patient to ACT Team and [facility] [employer] and provide list of [employer] with vacancies  Additional comments: N/A  Reason for Continuation of Hospitalization: Anxiety Depression Hallucinations Medication stabilization Suicidal ideation   Estimated length of stay: 3-5 days  For review of initial/current patient goals, please see plan of care.  Attendees: Patient:  [name] [name] 09/16/2012 9:54 AM  Family:     Physician:  Geoffery Lyons 09/16/2012 9:45 AM   Nursing: Carney Living, RN    09/16/2012 9:45 AM   Clinical Social Worker Ronda Fairly 09/16/2012 9:45 AM   Other:  Dorinda Hill, [place] PA Student 09/16/2012 9:45 AM   Other:  Enid Baas, PA 09/16/2012 9:45 AM   Other:  Titus Mould, Community Care Coordinator 09/16/2012 9:45 AM   Other:  Serena Colonel, PA 09/16/2012 9:45 AM    Scribe for Treatment Team:   Carney Bern, LCSWA  09/16/2012 9:45 AM

## 2012-09-16 NOTE — Progress Notes (Signed)
Patient ID: [name] [name], male   DOB: [birth year], 52 y.o.   MRN: [id number]  D: Took over patient's care @ 2330. Patient in bed sleeping. Respiration regular and unlabored. No sign of distress noted at this time  A: 15 mins checks for safety.  R: Patient remains asleep. Pt is safe.

## 2012-09-16 NOTE — Progress Notes (Signed)
Patient ID: [name] [name], male   DOB: [birth year], 52 y.o.   MRN: [id number] D: Patient in room asleep on approach. Pt presented with depressed mood and flat affect. Pt reported he has been sleeping all day due to increase in his haldol. Pt denies SI/HI/AVH and pain. Pt did not attend evening wrap up group. Pt denies any needs or concerns.  Cooperative with assessment. No acute distressed noted at this time.   A: Met with pt 1:1. Medications administered as prescribed. Writer encouraged pt to discuss feelings. Pt encouraged to come to staff with any question or concerns. 15 minutes checks for safety.  R: Patient remains safe. He is complaint with medications. Continue current POC.

## 2012-09-16 NOTE — Progress Notes (Signed)
D: Patient denies SI/HI and admits to some murmurs from a group and that he was hearing them at night and patient reports none in the afternoon hours; patient reports sleep is fair because of the voices; reports appetite is good ; reports energy level is low ; reports ability to pay attention is poor; rates depression as 8/10; rates hopelessness 8/10; rates anxiety as 6/10 because he is thinking about his living situation once discharged  A: Monitored q 15 minutes; patient encouraged to attend groups; patient educated about medications; patient given medications per physician orders; patient encouraged to express feelings and/or concerns  R: Patient is sad and anxious about his current situation; patient's interaction with staff and peers is appropriate; patient is taking medications as prescribed and tolerating medications; patient is attended some of the group but did not really engaged and missed the rest of the groups because " there where no other groups"

## 2012-09-17 MED ORDER — HALOPERIDOL 1 MG PO TABS
3.0000 mg | ORAL_TABLET | Freq: Three times a day (TID) | ORAL | Status: DC
  Filled 2012-09-17: qty 3

## 2012-09-17 MED ORDER — HALOPERIDOL 1 MG PO TABS
3.0000 mg | ORAL_TABLET | Freq: Three times a day (TID) | ORAL | Status: DC
  Administered 2012-09-17 – 2012-09-19 (×5): 3 mg via ORAL
  Filled 2012-09-17 (×12): qty 3

## 2012-09-17 NOTE — Progress Notes (Signed)
Psychoeducational Group Note  Date:  09/17/2012 Time:  0945 am  Group Topic/Focus:  Identifying Needs:   The focus of this group is to help patients identify their personal needs that have been historically problematic and identify healthy behaviors to address their needs.  Participation Level:  Did Not Attend   Andrena Mews 09/17/2012,2:47 PM

## 2012-09-17 NOTE — [facility] Group Notes (Signed)
[facility] Group Notes: (Clinical Social Work)   09/17/2012      Type of Therapy:  Group Therapy   Participation Level:  Did Not Attend    Ambrose Mantle, LCSW 09/17/2012, 11:15 AM

## 2012-09-17 NOTE — Progress Notes (Signed)
Patient did not attend AA meeting. 

## 2012-09-17 NOTE — Progress Notes (Signed)
Patient ID: Griffyn Kucinski, male   DOB: 29-Jul-1960, 52 y.o.   MRN: [id number] Patient ID: Rowe Warman, male   DOB: 01/23/1961, 52 y.o.   MRN: [id number] [facility] MD Progress Note  09/17/2012 3:43 PM Reiley Keisler  MRN:  [id number]  Subjective:  Acelin reports today that he feels tired and sad. Did not think he is improving. However, denies any hallucinations today.   Diagnosis:  Psychotic Disorder NOS, Polysubstance Dependence  ADL's:  Intact  Sleep: Fair  Appetite:  Fair  Suicidal Ideation:  Plan:  denies Intent:  denies Means:  denies Homicidal Ideation:  Plan:  denies Intent:  denies Means:  denies  AEB (as evidenced by): Per patient's reports.  Psychiatric Specialty Exam: Review of Systems  Constitutional: Negative.   HENT: Negative.   Eyes: Negative.   Respiratory: Negative.   Cardiovascular: Negative.   Gastrointestinal: Negative.   Genitourinary: Negative.   Musculoskeletal: Negative.   Skin: Negative.   Neurological: Negative.   Endo/Heme/Allergies: Negative.   Psychiatric/Behavioral: Positive for depression, hallucinations and substance abuse.    Blood pressure 129/92, pulse 96, temperature 98.1 F (36.7 C), temperature source Oral, resp. rate 16, height 6' 2.5" (1.892 m), weight 130.636 kg (288 lb).Body mass index is 36.49 kg/(m^2).  General Appearance: Disheveled  Eye Contact::  Minimal  Speech:  Clear and Coherent, Slow and not spontaneous  Volume:  Decreased  Mood:  Depressed  Affect:  Restricted  Thought Process:  Coherent and Goal Directed  Orientation:  Full (Time, Place, and Person)  Thought Content:  worries, concerns  Suicidal Thoughts:  No  Homicidal Thoughts:  No  Memory:  Immediate;   Fair Recent;   Fair Remote;   Fair  Judgement:  Fair  Insight:  Shallow  Psychomotor Activity:  Decreased  Concentration:  Fair  Recall:  Fair  Akathisia:  No  Handed:  Right  AIMS (if indicated):     Assets:  Desire for Improvement  Sleep:  Number of Hours:  6.5   Current Medications: Current Facility-Administered Medications  Medication Dose Route Frequency Provider Last Rate Last Dose  . acetaminophen (TYLENOL) tablet 650 mg  650 mg Oral Q6H PRN Shuvon Rankin, NP      . alum & mag hydroxide-simeth (MAALOX/MYLANTA) 200-200-20 MG/5ML suspension 30 mL  30 mL Oral Q4H PRN Shuvon Rankin, NP      . benztropine (COGENTIN) tablet 1 mg  1 mg Oral BID PRN Rachael Fee, MD      . FLUoxetine (PROZAC) capsule 20 mg  20 mg Oral Daily Shuvon Rankin, NP   20 mg at 09/17/12 0857  . haloperidol (HALDOL) tablet 3 mg  3 mg Oral TID Rachael Fee, MD   3 mg at 09/17/12 1202  . hydrochlorothiazide (MICROZIDE) capsule 12.5 mg  12.5 mg Oral Daily Rachael Fee, MD   12.5 mg at 09/17/12 0857  . hydrOXYzine (ATARAX/VISTARIL) tablet 50 mg  50 mg Oral QHS,MR X 1 Shuvon Rankin, NP   50 mg at 09/16/12 2248  . lactulose (CHRONULAC) 10 GM/15ML solution 20 g  20 g Oral QHS Shuvon Rankin, NP   20 g at 09/16/12 2248  . lisinopril (PRINIVIL,ZESTRIL) tablet 20 mg  20 mg Oral Daily Shuvon Rankin, NP   20 mg at 09/17/12 0856  . magnesium hydroxide (MILK OF MAGNESIA) suspension 30 mL  30 mL Oral Daily PRN Shuvon Rankin, NP      . OXcarbazepine (TRILEPTAL) tablet 150 mg  150 mg Oral BID Nelda Marseille  Nw[place], NP   150 mg at 09/17/12 0856  . traZODone (DESYREL) tablet 100 mg  100 mg Oral QHS PRN Shuvon Rankin, NP   100 mg at 09/15/12 2154    Lab Results: No results found for this or any previous visit (from the past 48 hour(s)).  Physical Findings: AIMS: Facial and Oral Movements Muscles of Facial Expression: None, normal Lips and Perioral Area: None, normal Jaw: None, normal Tongue: None, normal,Extremity Movements Upper (arms, wrists, hands, fingers): None, normal Lower (legs, knees, ankles, toes): None, normal, Trunk Movements Neck, shoulders, hips: None, normal, Overall Severity Severity of abnormal movements (highest score from questions above): None, normal Incapacitation due to  abnormal movements: None, normal Patient's awareness of abnormal movements (rate only patient's report): No Awareness, Dental Status Current problems with teeth and/or dentures?: No Does patient usually wear dentures?: No  CIWA:  CIWA-Ar Total: 0 COWS:  COWS Total Score: 0  Treatment Plan Summary: Daily contact with patient to assess and evaluate symptoms and progress in treatment Medication management  Plan: Supportive approach/coping skills/relapse prevention Encouraged out room to group sessions. Continue to address the psychotic symptoms Continue haldol at 3 mg Q Bedtime for hallucinations.  Medical Decision Making Problem Points:  Review of psycho-social stressors (1) Data Points:  Review of medication regiment & side effects (2)  I certify that inpatient services furnished can reasonably be expected to improve the patient's condition.   Armandina Stammer I 09/17/2012, 3:43 PM

## 2012-09-17 NOTE — [facility] Group Notes (Signed)
Pt did not attend self inventory group 

## 2012-09-17 NOTE — Progress Notes (Signed)
D   Pt has isolated to his room most of the day  And requested to have a tray brought back at lunch   He appears sedate and is dischevled  He has to be asked to come to the medication room   He denies hallucinations at present A   Verbal support given   Medications administered and effectiveness monitored   Q 15 min checks R   Pt safe at present

## 2012-09-18 NOTE — Progress Notes (Signed)
Pt in bed all evening. Did come up for meds upon request. States he does not feel he's progressing though hallucinations are no longer present. Feels that while meds are helping, he is very sedated. Pt flat, depressed, dissheveled. Supported, encouraged. Medicated per orders. Denies SI/HI and remains safe. Lawrence Marseilles

## 2012-09-18 NOTE — Progress Notes (Signed)
D   Pt has been in bed all day  He has not attended any groups  He said he feels like he is in a haze or a dream  And he doesn't have any energy  He did say he was not hearing voices or having any bad or intrusive thoughts   He just couldn't wake up A   Verbal support given  Medications administered and effectiveness monitored   Held the noon dose of haldol as pt was too lethargic and he was in agreement with same  Q 15 min checks R   Pt safe at present

## 2012-09-18 NOTE — Progress Notes (Signed)
Adult Psychoeducational Group Note  Date:  09/18/2012 Time:  8:22 PM  Group Topic/Focus:  Goals Group:   The focus of this group is to help patients establish daily goals to achieve during treatment and discuss how the patient can incorporate goal setting into their daily lives to aide in recovery.  Participation Level:  Active  Participation Quality:  Appropriate  Affect:  Appropriate  Cognitive:  Appropriate  Insight: Appropriate  Engagement in Group:  Engaged  Modes of Intervention:  Discussion  Additional Comments:  Attended AA  Aldona Lento 09/18/2012, 8:22 PM

## 2012-09-18 NOTE — Progress Notes (Signed)
Psychoeducational Group Note  Date:  09/18/2012 Time:  0945 am  Group Topic/Focus:  Making Healthy Choices:   The focus of this group is to help patients identify negative/unhealthy choices they were using prior to admission and identify positive/healthier coping strategies to replace them upon discharge.  Participation Level:  Did Not Attend    Andrena Mews 09/18/2012, 10:29 AM

## 2012-09-18 NOTE — [facility] Group Notes (Signed)
[facility] Group Notes: (Clinical Social Work)   09/18/2012      Type of Therapy:  Group Therapy   Participation Level:  Did Not Attend    Ambrose Mantle, LCSW 09/18/2012, 11:14 AM

## 2012-09-18 NOTE — Progress Notes (Signed)
Patient did not attend the evening speaker AA meeting. Pt was notified that meeting was beginning but pt remained in bed.

## 2012-09-18 NOTE — Progress Notes (Signed)
Patient ID: Eion Timbrook, male   DOB: 1960-08-20, 52 y.o.   MRN: [id number] Patient ID: Clemence Stillings, male   DOB: Oct 13, 1960, 52 y.o.   MRN: [id number] Patient ID: Sewell Pitner, male   DOB: March 07, 1961, 52 y.o.   MRN: [id number] [facility] MD Progress Note  09/18/2012 3:57 PM Amareon Phung  MRN:  [id number]  Subjective:  Thoms reports that he feels more depressed today. Rated his depression at #10. States that he did not feel like getting out of bed and or eating breakfast. Still wondering how his wife could have made a decision to not allow him to return home. Continue to have suicidal thoughts, no plans and or intent. Denies any hallucinations today.   Diagnosis:  Psychotic Disorder NOS, Polysubstance Dependence  ADL's:  Intact  Sleep: Fair  Appetite:  Fair  Suicidal Ideation:  Plan:  denies Intent:  denies Means:  denies Homicidal Ideation:  Plan:  denies Intent:  denies Means:  denies  AEB (as evidenced by): Per patient's reports.  Psychiatric Specialty Exam: Review of Systems  Constitutional: Negative.   HENT: Negative.   Eyes: Negative.   Respiratory: Negative.   Cardiovascular: Negative.   Gastrointestinal: Negative.   Genitourinary: Negative.   Musculoskeletal: Negative.   Skin: Negative.   Neurological: Negative.   Endo/Heme/Allergies: Negative.   Psychiatric/Behavioral: Positive for depression, hallucinations and substance abuse.    Blood pressure 129/90, pulse 89, temperature 96.7 F (35.9 C), temperature source Oral, resp. rate 20, height 6' 2.5" (1.892 m), weight 130.636 kg (288 lb).Body mass index is 36.49 kg/(m^2).  General Appearance: Disheveled  Eye Contact::  Minimal  Speech:  Clear and Coherent, Slow and not spontaneous  Volume:  Decreased  Mood:  Depressed, rated #10  Affect:  Restricted  Thought Process:  Coherent and Goal Directed  Orientation:  Full (Time, Place, and Person)  Thought Content:  worries, concerns  Suicidal Thoughts:  Yes, without  plans/intent.  Homicidal Thoughts:  No  Memory:  Immediate;   Fair Recent;   Fair Remote;   Fair  Judgement:  Fair  Insight:  Shallow  Psychomotor Activity:  Decreased  Concentration:  Fair  Recall:  Fair  Akathisia:  No  Handed:  Right  AIMS (if indicated):     Assets:  Desire for Improvement  Sleep:  Number of Hours: 5.75   Current Medications: Current Facility-Administered Medications  Medication Dose Route Frequency Provider Last Rate Last Dose  . acetaminophen (TYLENOL) tablet 650 mg  650 mg Oral Q6H PRN Shuvon Rankin, NP      . alum & mag hydroxide-simeth (MAALOX/MYLANTA) 200-200-20 MG/5ML suspension 30 mL  30 mL Oral Q4H PRN Shuvon Rankin, NP      . benztropine (COGENTIN) tablet 1 mg  1 mg Oral BID PRN Rachael Fee, MD      . FLUoxetine (PROZAC) capsule 20 mg  20 mg Oral Daily Shuvon Rankin, NP   20 mg at 09/18/12 0844  . haloperidol (HALDOL) tablet 3 mg  3 mg Oral TID Rachael Fee, MD   3 mg at 09/18/12 4332  . hydrochlorothiazide (MICROZIDE) capsule 12.5 mg  12.5 mg Oral Daily Rachael Fee, MD   12.5 mg at 09/18/12 0843  . hydrOXYzine (ATARAX/VISTARIL) tablet 50 mg  50 mg Oral QHS,MR X 1 Shuvon Rankin, NP   50 mg at 09/17/12 2239  . lactulose (CHRONULAC) 10 GM/15ML solution 20 g  20 g Oral QHS Shuvon Rankin, NP   20 g at 09/16/12 2248  .  lisinopril (PRINIVIL,ZESTRIL) tablet 20 mg  20 mg Oral Daily Shuvon Rankin, NP   20 mg at 09/18/12 0844  . magnesium hydroxide (MILK OF MAGNESIA) suspension 30 mL  30 mL Oral Daily PRN Shuvon Rankin, NP      . OXcarbazepine (TRILEPTAL) tablet 150 mg  150 mg Oral BID Sanjuana Kava, NP   150 mg at 09/18/12 0843  . traZODone (DESYREL) tablet 100 mg  100 mg Oral QHS PRN Shuvon Rankin, NP   100 mg at 09/15/12 2154    Lab Results: No results found for this or any previous visit (from the past 48 hour(s)).  Physical Findings: AIMS: Facial and Oral Movements Muscles of Facial Expression: None, normal Lips and Perioral Area: None,  normal Jaw: None, normal Tongue: None, normal,Extremity Movements Upper (arms, wrists, hands, fingers): None, normal Lower (legs, knees, ankles, toes): None, normal, Trunk Movements Neck, shoulders, hips: None, normal, Overall Severity Severity of abnormal movements (highest score from questions above): None, normal Incapacitation due to abnormal movements: None, normal Patient's awareness of abnormal movements (rate only patient's report): No Awareness, Dental Status Current problems with teeth and/or dentures?: No Does patient usually wear dentures?: No  CIWA:  CIWA-Ar Total: 0 COWS:  COWS Total Score: 0  Treatment Plan Summary: Daily contact with patient to assess and evaluate symptoms and progress in treatment Medication management  Plan: Supportive approach/coping skills/relapse prevention Encouraged out of  room to group sessions. Continue to address the depression Continue current plan of care..  Medical Decision Making Problem Points:  Review of psycho-social stressors (1) Data Points:  Review of medication regiment & side effects (2)  I certify that inpatient services furnished can reasonably be expected to improve the patient's condition.   Armandina Stammer I 09/18/2012, 3:57 PM

## 2012-09-18 NOTE — Progress Notes (Signed)
[name] [name] is up, dressed and brighter this evening. He attended AA tonight and while still dissheveled, his hygiene is improved. He continues to deny AH though his depression remains a 10/10 and he indicates some passive SI. He is able to verbally contract with this RN with maintained eye contact. Medicated per orders though pt states he does not need the lactulose again tonight. Denies HI/VH and remains safe at this time. Lawrence Marseilles

## 2012-09-19 MED ORDER — HALOPERIDOL 5 MG PO TABS
5.0000 mg | ORAL_TABLET | Freq: Every day | ORAL | Status: DC
  Administered 2012-09-19: 5 mg via ORAL
  Filled 2012-09-19: qty 4
  Filled 2012-09-19 (×3): qty 1

## 2012-09-19 NOTE — [facility] Group Notes (Signed)
[facility] LCSW Aftercare Discharge Planning Group Note   09/19/2012 8:45 AM  Participation Quality:  Appropriate  Mood/Affect: Flat, depressed  Depression Rating:  8-9  Anxiety Rating:  8-9  Thoughts of Suicide: Off and On Will you contract for safety?   Yes  Current AVH:  Less voices  Plan for Discharge/Comments:  Discharge to [facility] [employer] and follow up with [employer] [facility] [employer] [facility]  [employer]: Designer, television/film set  Supports: Designer, television/film set, ACT Team, [employer], Julious Payer

## 2012-09-19 NOTE — Progress Notes (Signed)
Pt attended AA group; affect--flat, blunted; appeared to be attentive.

## 2012-09-19 NOTE — Progress Notes (Signed)
Pt observed in his room in bed awake.  He reports he is doing fine today.  He has SI thoughts that come and go.  He denies SI/HI/AV at this time.  He says the Haldol still makes him sleepy.  Dosing has been decreased since last week.  He has no withdrawal symptoms.  He states he will probably be discharged tomorrow to a halfway house.  He says he is ok with this, although he would rather go home.  He can't understand why his wife will not let him return home, but he accepts that decision.  He voices no other needs/concerns at this time.  Support and encouragement offered.  Safety maintained with q15 minute checks.

## 2012-09-19 NOTE — [facility] Group Notes (Signed)
[facility] LCSW Group Therapy  09/19/2012 1:15 PM  Type of Therapy: Group Therapy 1:15 to 2:30  Participation Level: Minimal  Participation Quality:  Appropriate  Affect:  Flat, depressed  Cognitive: Alert and oriented  Insight: Developing  Engagement in Therapy:  Developing, Improving  Modes of Intervention: Exploration, discussion, socialization and support  Summary of Progress/Problems:  Group discussion focused on what patient's see as their own obstacles to recovery.  Patient shares belief that a new living environment may be difficult to deal with and later was able to identify the end of his marital relationship was the most difficult.  [name] was able to identify with others in the group who shared about suicidal ideation and attempts.    [name] [name]

## 2012-09-19 NOTE — Progress Notes (Signed)
[facility] MD Progress Note  09/19/2012 2:28 PM [name] [name]  MRN:  [id number] Subjective:  Patient reports today that his depression is 9/10 and anxiety is 8/10.  He denies SI/HI/AVH during this encounter.  He stated that he has come to term with the fact he will be going to half way house after discharge.  He reported blurry vision related to some of his medications and would want them changed to night time on discharge.  Patient exhibited some anger and frustration towards his wife for spending his money he could have used to move to the halfway house. Diagnosis:   Axis I: Psychotic Disorder NOS and Delusional disorder Axis II: Deferred Axis III:  Past Medical History  Diagnosis Date  . Thrombocytopenia   . Cirrhosis   . Hepatitis C   . Hypertension   . Anxiety   . Anemia   . Blood transfusion   . Obesity   . Pancreatitis    Axis IV: economic problems, housing problems, other psychosocial or environmental problems and problems related to social environment Axis V: 51-60 moderate symptoms  ADL's:  Intact  Sleep: Good  Appetite:  Good  Suicidal Ideation:  Plan:  none Intent:  none Means:  none Homicidal Ideation:  Plan:  none Intent:  none Means:  none AEB (as evidenced by):  Psychiatric Specialty Exam: Review of Systems  Constitutional: Negative.   HENT: Negative.   Eyes: Positive for blurred vision (Reports blurry vision related to his psychiatric medications.  ).  Respiratory: Negative.   Gastrointestinal: Negative.   Genitourinary: Negative.   Musculoskeletal: Negative.   Skin: Negative.   Neurological: Negative.   Endo/Heme/Allergies: Negative.   Psychiatric/Behavioral: Positive for depression (Rates his depression 9/10). Negative for suicidal ideas, hallucinations, memory loss and substance abuse. The patient is nervous/anxious (Rates his anxiety 8/10). The patient does not have insomnia.     Blood pressure 128/85, pulse 89, temperature 96.7 F (35.9 C),  temperature source Oral, resp. rate 20, height 6' 2.5" (1.892 m), weight 130.636 kg (288 lb).Body mass index is 36.49 kg/(m^2).  General Appearance: Fairly Groomed  Patent attorney::  Fair  Speech:  Slow  Volume:  Normal  Mood:  Angry, Anxious, Depressed and Hopeless  Affect:  Depressed  Thought Process:  Coherent  Orientation:  Full (Time, Place, and Person)  Thought Content:  WDL  Suicidal Thoughts:  No  Homicidal Thoughts:  No  Memory:  Immediate;   Good Recent;   Good Remote;   Good  Judgement:  Good  Insight:  Fair  Psychomotor Activity:  Normal  Concentration:  Good  Recall:  Good  Akathisia:  No  Handed:  Right  AIMS (if indicated):     Assets:  Desire for Improvement  Sleep:  Number of Hours: 6.5   Current Medications: Current Facility-Administered Medications  Medication Dose Route Frequency Provider Last Rate Last Dose  . acetaminophen (TYLENOL) tablet 650 mg  650 mg Oral Q6H PRN Shuvon Rankin, NP      . alum & mag hydroxide-simeth (MAALOX/MYLANTA) 200-200-20 MG/5ML suspension 30 mL  30 mL Oral Q4H PRN Shuvon Rankin, NP      . benztropine (COGENTIN) tablet 1 mg  1 mg Oral BID PRN Rachael Fee, MD      . FLUoxetine (PROZAC) capsule 20 mg  20 mg Oral Daily Shuvon Rankin, NP   20 mg at 09/19/12 0759  . haloperidol (HALDOL) tablet 3 mg  3 mg Oral TID Rachael Fee, MD  3 mg at 09/19/12 0759  . hydrochlorothiazide (MICROZIDE) capsule 12.5 mg  12.5 mg Oral Daily Rachael Fee, MD   12.5 mg at 09/19/12 0800  . hydrOXYzine (ATARAX/VISTARIL) tablet 50 mg  50 mg Oral QHS,MR X 1 Shuvon Rankin, NP   50 mg at 09/18/12 2144  . lactulose (CHRONULAC) 10 GM/15ML solution 20 g  20 g Oral QHS Shuvon Rankin, NP   20 g at 09/16/12 2248  . lisinopril (PRINIVIL,ZESTRIL) tablet 20 mg  20 mg Oral Daily Shuvon Rankin, NP   20 mg at 09/19/12 0759  . magnesium hydroxide (MILK OF MAGNESIA) suspension 30 mL  30 mL Oral Daily PRN Shuvon Rankin, NP      . OXcarbazepine (TRILEPTAL) tablet 150 mg  150  mg Oral BID Sanjuana Kava, NP   150 mg at 09/19/12 0759  . traZODone (DESYREL) tablet 100 mg  100 mg Oral QHS PRN Shuvon Rankin, NP   100 mg at 09/15/12 2154    Lab Results: No results found for this or any previous visit (from the past 48 hour(s)).  Physical Findings: AIMS: Facial and Oral Movements Muscles of Facial Expression: None, normal Lips and Perioral Area: None, normal Jaw: None, normal Tongue: None, normal,Extremity Movements Upper (arms, wrists, hands, fingers): None, normal Lower (legs, knees, ankles, toes): None, normal, Trunk Movements Neck, shoulders, hips: None, normal, Overall Severity Severity of abnormal movements (highest score from questions above): None, normal Incapacitation due to abnormal movements: None, normal Patient's awareness of abnormal movements (rate only patient's report): No Awareness, Dental Status Current problems with teeth and/or dentures?: No Does patient usually wear dentures?: No  CIWA:  CIWA-Ar Total: 0 COWS:  COWS Total Score: 0  Treatment Plan Summary: Patient have been on antipsychotic medications and reports no auditory hallucination.  He is seen in the dayroom participating in group therapy with peers.  He will be discharged on these medications to maintain stability. Daily contact with patient to assess and evaluate symptoms and progress in treatment  Plan: Continue to optimize treatment with the haldol  Medical Decision Making Problem Points:  Established problem, stable/improving (1) and Review of psycho-social stressors (1) Data Points:  Review and summation of old records (2) Review of medication regiment & side effects (2)  I certify that inpatient services furnished can reasonably be expected to improve the patient's condition.   Dahlia Byes, C PMHNP-BC 09/19/2012, 2:28 PM

## 2012-09-19 NOTE — Progress Notes (Signed)
Pt has been in bed all day.  He came to medication window for his medication and went back to bed.  He rated his sleep as well appetite poor "just this morning" otherwise was okay energy level low ability to pay attention as poor.  He rated all his depression hopelessness and anxiety a 9 on his self-inventory.  He does admit to having some passive S/I no plan and contracts to come to staff.  He denies any A/V hallucinations this morning.  He is hoping to get into a half-way house and possibly have an act team to follow. He claims he will have trouble obtaining his medications d/t no transportation.  Spoke with Dr. Dub Mikes about pt's haldol and Dr. Dub Mikes wanted the noon dose held until he can talk with pt for himself.

## 2012-09-20 MED ORDER — TRAZODONE HCL 100 MG PO TABS: 100.0000 mg | ORAL_TABLET | Freq: Every evening | ORAL | Status: DC | PRN

## 2012-09-20 MED ORDER — OXCARBAZEPINE 150 MG PO TABS: 150.0000 mg | ORAL_TABLET | Freq: Two times a day (BID) | ORAL | Status: DC

## 2012-09-20 MED ORDER — ACETAMINOPHEN 325 MG PO TABS: 975.0000 mg | ORAL_TABLET | Freq: Four times a day (QID) | ORAL | Status: DC | PRN

## 2012-09-20 MED ORDER — BENZTROPINE MESYLATE 1 MG PO TABS: 1.0000 mg | ORAL_TABLET | Freq: Two times a day (BID) | ORAL | Status: DC | PRN

## 2012-09-20 MED ORDER — HYDROXYZINE HCL 50 MG PO TABS: 50.0000 mg | ORAL_TABLET | Freq: Every evening | ORAL | Status: DC | PRN

## 2012-09-20 MED ORDER — FLUOXETINE HCL 20 MG PO CAPS: 20.0000 mg | ORAL_CAPSULE | Freq: Every day | ORAL | Status: DC

## 2012-09-20 MED ORDER — LACTULOSE 10 GM/15ML PO SOLN: 30.0000 g | Freq: Two times a day (BID) | ORAL | Status: DC

## 2012-09-20 MED ORDER — HALOPERIDOL 5 MG PO TABS: 5.0000 mg | ORAL_TABLET | Freq: Every day | ORAL | Status: DC

## 2012-09-20 MED ORDER — LISINOPRIL-HYDROCHLOROTHIAZIDE 20-12.5 MG PO TABS: 1.0000 | ORAL_TABLET | Freq: Every day | ORAL | Status: DC

## 2012-09-20 NOTE — [facility] Group Notes (Signed)
[facility] LCSW Aftercare Discharge Planning Group Note   09/20/2012 8:45 AM  Participation Quality:  Hesitant  Mood/Affect:  Flat  Depression Rating:  7  Anxiety Rating:  7  Thoughts of Suicide:  No Will you contract for safety?   NA  Current AVH:  No  Plan for Discharge/Comments:  Follow up with Vesta Mixer and ACT Team  Transportation Means: Director of [employer]  Supports: [place], [place] TEAM and Malachi Pro

## 2012-09-20 NOTE — Discharge Summary (Signed)
Physician Discharge Summary Note  Patient:  [name] [name] is an 52 y.o., male MRN:  [id number] DOB:  1960-07-28 Patient phone:  [phone] (home)  Patient address:   [address] [place] [address],   Date of Admission:  09/11/2012  Date of Discharge: 09/20/12  Reason for Admission:  Hallucinations, delusions, paranoia  Discharge Diagnoses: Principal Problem:   Delusional disorder Active Problems:   Psychosis  Review of Systems  Constitutional: Negative.   HENT: Negative.   Eyes: Negative.   Respiratory: Negative.   Cardiovascular: Negative.   Gastrointestinal: Negative.   Genitourinary: Negative.   Musculoskeletal: Negative.   Skin: Negative.   Neurological: Negative.   Endo/Heme/Allergies: Negative.   Psychiatric/Behavioral: Positive for depression (Stabilized with medication prior to discharge) and substance abuse (Hx cocaine abuse, Opioid abuse, alcohol dependence). Negative for suicidal ideas, hallucinations and memory loss. The patient has insomnia (Stabilized with medication prior to discharge). The patient is not nervous/anxious.    Axis Diagnosis:   AXIS I:  Alcohol dependence, Cocaine abuse, major depression, Opioid abuse AXIS II:  Deferred AXIS III:   Past Medical History  Diagnosis Date  . Thrombocytopenia   . Cirrhosis   . Hepatitis C   . Hypertension   . Anxiety   . Anemia   . Blood transfusion   . Obesity   . Pancreatitis    AXIS IV:  economic problems, educational problems, housing problems, occupational problems, other psychosocial or environmental problems and Substance absue issues, legal problems AXIS V:  63  Level of Care:  OP  Hospital Course:  This is a 52 year old Caucasian male. Admitted to [facility] as a walk-in accompanied by [facility] staff. Report indicated patient was having hallucinations, Delusions and paranoia. [name] [name] reports, "I was at the [facility] receiving treatment for substance abuse. Had been  at this center x 16 days. However, at 02:30 am Saturday morning, I started to hallucinate. First, I got out of my bed, and started working into the day room. I thought that I heard my name called and it was the voice of the director. I thought that she told me that I was going home. So, I went back into my room and started packing my belongings. Then I started heading out of the treatment center towards the door. But, I was told by one of the staff that I was not suppose to be going home, and that no one told me to go home. As I was leaving the center, I felt confused because I did not know where I was gonna go. I went back to my room, lay back in bed. I started to hallucinate again. I was talking and conversing with people that were not there. I don't know why this is happening to me. But, I remembered one time when I felt like this. At the time, I took Ambien medicine for sleep. But, I know that I have not done anything to make this happen. I feel afraid".   While a patient in this hospital and after admission assessment/evaluation, it was determined based on patient's symptoms that he will need medication management to stabilize his current psychotic mood symptoms. Although he has history of cocaine and opiate abuse including alcohol dependency, his UDS report indicated no positive result of any of such substances. And because of the above mentioned reasons, [name] [name] did not receive any detoxification treatment protocols. He was ordered and did receive Haldol 5 mg for mood control (Hallucinations,  Fluoxetine 20 mg daily for depression, Trileptal 150 mg for mood stabilization and Trazodone 100 mg Q bedtime for sleep. It took quite sometime, but his symptoms eventually responded to the treatment regimen applied. Although his psychotic symptoms were eventually controlled, patient did struggle with the fact that his wife has made the decision to not allow him to return to the home they shared for over 10 years.  This was based on facts presented by wife that patient's behavior was disruptive, out of control and difficult for her to manage. This did in a way worsened his depressive symptoms. [name] [name] also was enrolled in group counseling sessions and activities where he was counseled and learned coping skills that should help him cope better and manage his symptoms effectively after discharge. He also received medication management and monitoring for his other previously existing medical issues and concerns. He tolerated his treatment regimen without any significant adverse effects and or reactions presented.   Patient did respond positively to his treatment regimen. This is evidenced by his daily reports of gradula mood improvement, reduction of symptoms and presentation of good affect/eye contact. He attended treatment team meeting this am and met with her treatment team members. His reason for admission, present symptoms, response to treatment and discharge plans discussed with patient. [name] [name] endorsed that his symptoms has stabilized and that he is ready for discharge to pursue psychiatric care on outpatient basis. It was then agreed upon that patient will follow-up care at [facility] here in [place], [place] between the hours of 08:30 am and 09:30 am. He was informed and instructed that this is a walk-in appointment as well. The address, date, time and contact information for this clinic provided for patient in writing.  Upon discharge, [name] [name] adamantly denies any suicidal, homicidal ideations, auditory, visual hallucinations, paranoia and or delusional thoughts. He was provided with 4 days worth supply samples of his [facility] discharge medications. He left [facility] with all personal belongings via personal arranged transport in no apparent distress. Consults:  None  Significant Diagnostic Studies:  labs: CBC with diff, CMP, UDS, Toxicology tests,   Discharge Vitals:   Blood pressure 118/76, pulse 89,  temperature 98.5 F (36.9 C), temperature source Oral, resp. rate 20, height 6' 2.5" (1.892 m), weight 130.636 kg (288 lb). Body mass index is 36.49 kg/(m^2). Lab Results:   No results found for this or any previous visit (from the past 72 hour(s)).  Physical Findings: AIMS: Facial and Oral Movements Muscles of Facial Expression: None, normal Lips and Perioral Area: None, normal Jaw: None, normal Tongue: None, normal,Extremity Movements Upper (arms, wrists, hands, fingers): None, normal Lower (legs, knees, ankles, toes): None, normal, Trunk Movements Neck, shoulders, hips: None, normal, Overall Severity Severity of abnormal movements (highest score from questions above): None, normal Incapacitation due to abnormal movements: None, normal Patient's awareness of abnormal movements (rate only patient's report): No Awareness, Dental Status Current problems with teeth and/or dentures?: No Does patient usually wear dentures?: No  CIWA:  CIWA-Ar Total: 0 COWS:  COWS Total Score: 0  Psychiatric Specialty Exam: See Psychiatric Specialty Exam and Suicide Risk Assessment completed by Attending Physician prior to discharge.  [facility]charge destination[facility]  Is patient on multiple antipsychotic therapies at discharge:  No   Has Patient had three or more failed trials of antipsychotic monotherapy by history:  No  Recommended Plan for Multiple Antipsychotic Therapies: NA     Medication List    STOP taking these medications  amantadine 100 MG capsule  Commonly known as:  SYMMETREL     ARIPiprazole 5 MG tablet  Commonly known as:  ABILIFY     Flax Seed Oil 1000 MG Caps     hydrocortisone cream 1 %     multivitamin with minerals Tabs      TAKE these medications     Indication   acetaminophen 325 MG tablet  Commonly known as:  TYLENOL  Take 3 tablets (975 mg total) by mouth every 6 (six) hours as needed for pain (for persistant neck pain).   Indication:   Pain     benztropine 1 MG tablet  Commonly known as:  COGENTIN  Take 1 tablet (1 mg total) by mouth 2 (two) times daily as needed. For prevention of drug induced involuntary movements   Indication:  Extrapyramidal Reaction caused by Medications     FLUoxetine 20 MG capsule  Commonly known as:  PROZAC  Take 1 capsule (20 mg total) by mouth daily. For depression   Indication:  Major Depressive Disorder     haloperidol 5 MG tablet  Commonly known as:  HALDOL  Take 1 tablet (5 mg total) by mouth at bedtime. For mood control   Indication:  Psychosis, Mood control     hydrOXYzine 50 MG tablet  Commonly known as:  ATARAX/VISTARIL  Take 1 tablet (50 mg total) by mouth at bedtime and may repeat dose one time if needed. For anxiety symptoms   Indication:  Anxiety associated with Organic Disease     lactulose 10 GM/15ML solution  Commonly known as:  CHRONULAC  Take 45 mLs (30 g total) by mouth 2 (two) times daily. For constipation   Indication:  Chronic Constipation     lisinopril-hydrochlorothiazide 20-12.5 MG per tablet  Commonly known as:  PRINZIDE,ZESTORETIC  Take 1 tablet by mouth daily. For high blood pressure control   Indication:  High Blood Pressure     OXcarbazepine 150 MG tablet  Commonly known as:  TRILEPTAL  Take 1 tablet (150 mg total) by mouth 2 (two) times daily. For mood stabilization   Indication:  Manic-Depression, for mood stabilization     traZODone 100 MG tablet  Commonly known as:  DESYREL  Take 1 tablet (100 mg total) by mouth at bedtime as needed for sleep. For depression/sleep   Indication:  Trouble Sleeping, Major Depressive Disorder       Follow-up Information   [facility] with [facility] On 09/20/2012. ([facility] - Friday between the hours of 8 AM and 3 PM before next Wednesday )    Contact information:   [address] [place] [address] [facility] [phone] FAX [phone]    Follow-up recommendations: Activity:  As  tolerated Diet: As recommended by your primary care doctor. Keep all scheduled follow-up appointments as recommended.  Continue to work the relapse prevention plan Comments: Take all your medications as prescribed by your mental healthcare provider. Report any adverse effects and or reactions from your medicines to your outpatient provider promptly. Patient is instructed and cautioned to not engage in alcohol and or illegal drug use while on prescription medicines. In the event of worsening symptoms, patient is instructed to call the crisis hotline, 911 and or go to the nearest ED for appropriate evaluation and treatment of symptoms. Follow-up with your primary care provider for your other medical issues, concerns and or health care needs.   Total Discharge Time:  Greater than 30 minutes.  SignedArmandina Stammer I 09/21/2012, 2:58 PM

## 2012-09-20 NOTE — [facility] Suicide Risk Assessment (Signed)
[facility] INPATIENT:  Family/Significant Other Suicide Prevention Education  Suicide Prevention Education:  Family/Significant Other Refusal to Support Patient after Discharge:  Suicide Prevention Education Not Provided:  Patient has identified home of family/significant other as the place the patient will be residing after discharge.  With written consent of the patient, two attempts were made to provide Suicide Prevention Education to [name] [name] at [phone] this person indicates he/she will not be responsible for the patient after discharge. Writer provided suicide prevention education directly to patient; conversation included risk factors, warning signs and resources to contact for help. Mobile crisis services explained and contact card placed in chart for pt to receive at discharge. Writer provided suicide education pamphlet with contact information for 24 hour mobile crisis service serving the area.    [name] 09/20/2012,1:34 PM

## 2012-09-20 NOTE — [facility] Suicide Risk Assessment (Signed)
Suicide Risk Assessment  Discharge Assessment     Demographic Factors:  Male and Caucasian  Mental Status Per Nursing Assessment::   On Admission:  Self-harm thoughts;NA  Current Mental Status by Physician: In full contact with reality. There are no suicidal ideas, plans or intent. His mood is "better, no voices" his affect is constricted. He is going to go to a half way house. His wife is going to go and visit him there.    Loss Factors: Loss of significant relationship  Historical Factors: NA  Risk Reduction Factors:   Sense of responsibility to family, Living with another person, especially a relative and Positive social support  Continued Clinical Symptoms:  Depression:   Comorbid alcohol abuse/dependence Alcohol/Substance Abuse/Dependencies  Cognitive Features That Contribute To Risk:  Closed-mindedness Thought constriction (tunnel vision)    Suicide Risk:  Minimal: No identifiable suicidal ideation.  Patients presenting with no risk factors but with morbid ruminations; may be classified as minimal risk based on the severity of the depressive symptoms  Discharge Diagnoses:   AXIS I:  Polysubstance Dependence, Psychotic Disorder NOS AXIS II:  Deferred AXIS III:   Past Medical History  Diagnosis Date  . Thrombocytopenia   . Cirrhosis   . Hepatitis C   . Hypertension   . Anxiety   . Anemia   . Blood transfusion   . Obesity   . Pancreatitis    AXIS IV:  problems with primary support group AXIS V:  61-70 mild symptoms  Plan Of Care/Follow-up recommendations:  Activity:  as tolerated Diet:  regular Follow up outpatient basis Is patient on multiple antipsychotic therapies at discharge:  No   Has Patient had three or more failed trials of antipsychotic monotherapy by history:  No  Recommended Plan for Multiple Antipsychotic Therapies: N/A   [name] [name] A 09/20/2012, 1:04 PM

## 2012-09-20 NOTE — Progress Notes (Signed)
Pt was discharged home today.  He denied any S/I H/I or A/V hallucinations.    He was given f/u appointment, rx, sample medications, hotline info booklet.  He voiced understanding to all instructions provided.  He declined the need for smoking cessation materials.  

## 2012-09-20 NOTE — Progress Notes (Signed)
Recreation Therapy Notes  Date: 05.06.2014 Time: 3:00pm Location: [address] Day Room      Group Topic/Focus: Communication, Journalist, newspaper, Team Building  Participation Level: Did not attend  [employer], LRT/CTRS  Jearl Klinefelter 09/20/2012 4:54 PM

## 2012-09-20 NOTE — Progress Notes (Signed)
[facility] Adult Case Management Discharge Plan :  Will you be returning to the same living situation after discharge: No. At discharge, do you have transportation home?:Yes,  Interior and spatial designer of [employer] Do you have the ability to pay for your medications:Yes,  through [employer]   Release of information consent forms completed and in the chart;  Patient's signature needed at discharge.  Patient to Follow up at: Follow-up Information   [facility] up [facility] On 09/20/2012. ([facility] at [facility] - Friday between the hours of 8 AM and 3 PM before next Wednesday )    Contact information:   [address] [place] [address] [facility] [phone] Valinda Hoar [phone]      Patient denies SI/HI:   Yes,  denies both    Safety Planning and Suicide Prevention discussed:  Yes,  with patient  [name] 09/20/2012, 1:27 PM

## 2012-09-23 NOTE — Progress Notes (Signed)
Patient Discharge Instructions:  After Visit Summary (AVS):   Faxed to:  09/23/12 Discharge Summary Note:   Faxed to:  09/23/12 Psychiatric Admission Assessment Note:   Faxed to:  09/23/12 Suicide Risk Assessment - Discharge Assessment:   Faxed to:  09/23/12 Faxed/Sent to the Next Level Care provider:  09/23/12 Faxed to [facility] @ [phone] Jerelene Redden, 09/23/2012, 4:02 PM

## 2012-09-26 ENCOUNTER — Emergency Department (HOSPITAL_COMMUNITY): Payer: Medicare Other

## 2012-09-26 ENCOUNTER — Encounter (HOSPITAL_COMMUNITY): Payer: Self-pay | Admitting: *Deleted

## 2012-09-26 ENCOUNTER — Emergency Department (HOSPITAL_COMMUNITY)
Admission: EM | Admit: 2012-09-26 | Discharge: 2012-09-26 | Discharge status: Against Medical Advice | Payer: Medicare Other | Source: Home / Self Care | Attending: Emergency Medicine | Admitting: Emergency Medicine

## 2012-09-26 DIAGNOSIS — F411 Generalized anxiety disorder: Secondary | ICD-10-CM | POA: Insufficient documentation

## 2012-09-26 DIAGNOSIS — Z862 Personal history of diseases of the blood and blood-forming organs and certain disorders involving the immune mechanism: Secondary | ICD-10-CM | POA: Insufficient documentation

## 2012-09-26 DIAGNOSIS — Z8719 Personal history of other diseases of the digestive system: Secondary | ICD-10-CM | POA: Insufficient documentation

## 2012-09-26 DIAGNOSIS — Z8619 Personal history of other infectious and parasitic diseases: Secondary | ICD-10-CM | POA: Insufficient documentation

## 2012-09-26 DIAGNOSIS — I10 Essential (primary) hypertension: Secondary | ICD-10-CM | POA: Insufficient documentation

## 2012-09-26 DIAGNOSIS — Z87891 Personal history of nicotine dependence: Secondary | ICD-10-CM | POA: Insufficient documentation

## 2012-09-26 DIAGNOSIS — R4182 Altered mental status, unspecified: Secondary | ICD-10-CM | POA: Insufficient documentation

## 2012-09-26 DIAGNOSIS — E669 Obesity, unspecified: Secondary | ICD-10-CM | POA: Insufficient documentation

## 2012-09-26 DIAGNOSIS — R4789 Other speech disturbances: Secondary | ICD-10-CM | POA: Insufficient documentation

## 2012-09-26 DIAGNOSIS — R0602 Shortness of breath: Secondary | ICD-10-CM | POA: Insufficient documentation

## 2012-09-26 DIAGNOSIS — Z79899 Other long term (current) drug therapy: Secondary | ICD-10-CM | POA: Insufficient documentation

## 2012-09-26 LAB — COMPREHENSIVE METABOLIC PANEL
Albumin: 3.4 g/dL — ABNORMAL LOW (ref 3.5–5.2)
Alkaline Phosphatase: 65 U/L (ref 39–117)
BUN: 15 mg/dL (ref 6–23)
Calcium: 9.4 mg/dL (ref 8.4–10.5)
GFR calc Af Amer: >90 mL/min (ref >90)
Potassium: 3.2 mEq/L — ABNORMAL LOW (ref 3.5–5.1)
Sodium: 141 mEq/L (ref 135–145)
Total Protein: 6.7 g/dL (ref 6.0–8.3)

## 2012-09-26 LAB — CBC WITH DIFFERENTIAL/PLATELET
Basophils Absolute: 0 10*3/uL (ref 0.0–0.1)
Eosinophils Relative: 0 % (ref 0–5)
HCT: 38.2 % — ABNORMAL LOW (ref 39.0–52.0)
Hemoglobin: 13.3 g/dL (ref 13.0–17.0)
Lymphocytes Relative: 4 % — ABNORMAL LOW (ref 12–46)
Lymphs Abs: 0.3 10*3/uL — ABNORMAL LOW (ref 0.7–4.0)
MCV: 81.8 fL (ref 78.0–100.0)
Monocytes Absolute: 0.5 10*3/uL (ref 0.1–1.0)
Monocytes Relative: 8 % (ref 3–12)
Neutro Abs: 5.6 10*3/uL (ref 1.7–7.7)
RDW: 16.1 % — ABNORMAL HIGH (ref 11.5–15.5)
WBC: 6.5 10*3/uL (ref 4.0–10.5)

## 2012-09-26 LAB — URINALYSIS, ROUTINE W REFLEX MICROSCOPIC
Bilirubin Urine: NEGATIVE
Glucose, UA: NEGATIVE mg/dL
Hgb urine dipstick: NEGATIVE
Ketones, ur: NEGATIVE mg/dL
Nitrite: NEGATIVE
Specific Gravity, Urine: 1.021 (ref 1.005–1.030)
pH: 6 (ref 5.0–8.0)

## 2012-09-26 LAB — RAPID URINE DRUG SCREEN, HOSP PERFORMED
Amphetamines: NOT DETECTED
Barbiturates: NOT DETECTED
Benzodiazepines: POSITIVE — AB
Cocaine: NOT DETECTED
Tetrahydrocannabinol: NOT DETECTED

## 2012-09-26 LAB — POCT I-STAT 3, ART BLOOD GAS (G3+)
O2 Saturation: 98 %
Patient temperature: 98.6
pO2, Arterial: 87 mmHg (ref 80.0–100.0)

## 2012-09-26 LAB — PROTIME-INR
INR: 1.22 (ref 0.00–1.49)
Prothrombin Time: 15.2 seconds (ref 11.6–15.2)

## 2012-09-26 LAB — CARBAMAZEPINE LEVEL, TOTAL: Carbamazepine Lvl: <0.5 ug/mL — ABNORMAL LOW (ref 4.0–12.0)

## 2012-09-26 MED ORDER — POTASSIUM CHLORIDE CRYS ER 20 MEQ PO TBCR
40.0000 meq | EXTENDED_RELEASE_TABLET | Freq: Once | ORAL | Status: AC
  Administered 2012-09-26: 40 meq via ORAL

## 2012-09-26 MED ORDER — POTASSIUM CHLORIDE CRYS ER 20 MEQ PO TBCR
EXTENDED_RELEASE_TABLET | ORAL | Status: AC
  Filled 2012-09-26: qty 2

## 2012-09-26 NOTE — ED Provider Notes (Addendum)
History     CSN: [id number]  Arrival date & time 09/26/12  4098   First MD Initiated Contact with Patient 09/26/12 [id number]     Level V caveat altered mental status Chief Complaint  Patient presents with  . Altered Mental Status    (Consider location/radiation/quality/duration/timing/severity/associated sxs/prior treatment) Patient is a 52 y.o. male presenting with altered mental status.  Altered Mental Status Associated symptoms include shortness of breath.   Patient reports that he fell between 2 beds at that he was residing.(EMS reports patient stated friend's home, patient reports he was staying at his wife's home approximately 2:00 this morning. Since the event he complains of mild shortness of breath no other complaint. Patient is uncertain about his medication dosages. EMS performed CBG which was 147. Past Medical History  Diagnosis Date  . Thrombocytopenia   . Cirrhosis   . Hepatitis C   . Hypertension   . Anxiety   . Anemia   . Blood transfusion   . Obesity   . Pancreatitis     Past Surgical History  Procedure Laterality Date  . Tonsillectomy      Family History  Problem Relation Age of Onset  . Cancer - Other Mother     Uterine  . Coronary artery disease Father   . Coronary artery disease Paternal Grandfather   . Hypertension Father   . Hypertension Paternal Grandfather     History  Substance Use Topics  . Smoking status: Former Smoker    Types: Cigarettes    Quit date: 11/05/2003  . Smokeless tobacco: Not on file  . 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 states last etoh was 2011     denies recent alcohol use. Denies tobacco. 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   Psychiatric/Behavioral: Positive for altered mental status.    Allergies  Review of patient's allergies indicates no  known allergies.  Home Medications   Current Outpatient Rx  Name  Route  Sig  Dispense  Refill  . amantadine (SYMMETREL) 100 MG capsule   Oral   Take 100 mg by mouth 3 (three) times daily.         . benztropine (COGENTIN) 1 MG tablet   Oral   Take 1 tablet (1 mg total) by mouth 2 (two) times daily as needed. For prevention of drug induced involuntary movements   60 tablet   0   . FLUoxetine (PROZAC) 20 MG capsule   Oral   Take 20 mg by mouth daily.         . haloperidol (HALDOL) 5 MG tablet   Oral   Take 1 tablet (5 mg total) by mouth at bedtime. For mood control   30 tablet   0   . hydrOXYzine (ATARAX/VISTARIL) 50 MG tablet   Oral   Take 1 tablet (50 mg total) by mouth at bedtime and may repeat dose one time if needed. For anxiety symptoms   60 tablet   0   . lactulose (CHRONULAC) 10 GM/15ML solution   Oral   Take 45 mLs (30 g total) by mouth 2 (two) times daily. For constipation   240 mL      . lisinopril-hydrochlorothiazide (PRINZIDE,ZESTORETIC) 20-12.5 MG per tablet   Oral   Take 1 tablet by mouth daily. For high blood pressure control  30 tablet      . OXcarbazepine (TRILEPTAL) 150 MG tablet   Oral   Take 150 mg by mouth 2 (two) times daily. For mood stabilization         . traZODone (DESYREL) 100 MG tablet   Oral   Take 1 tablet (100 mg total) by mouth at bedtime as needed for sleep. For depression/sleep   30 tablet   0   . acetaminophen (TYLENOL) 325 MG tablet   Oral   Take 3 tablets (975 mg total) by mouth every 6 (six) hours as needed for pain (for persistant neck pain).           BP 111/53  Pulse 100  Temp(Src) 97.8 F (36.6 C) (Oral)  Resp 24  SpO2 89%  Physical Exam  Nursing note and vitals reviewed. Constitutional: He is oriented to person, place, and time.  Chronically ill-appearing. Speech mildly slurred. Answers appropriately  HENT:  Head: Normocephalic and atraumatic.  Mucous membranes dry  Eyes: Conjunctivae are  normal. Pupils are equal, round, and reactive to light.  Neck: Neck supple. No tracheal deviation present. No thyromegaly present.  Cardiovascular: Normal rate and regular rhythm.   No murmur heard. Pulmonary/Chest: Effort normal and breath sounds normal.  Abdominal: Soft. Bowel sounds are normal. He exhibits no distension. There is no tenderness.  Musculoskeletal: Normal range of motion. He exhibits no edema and no tenderness.  Neurological: He is alert and oriented to person, place, and time. He has normal reflexes. Coordination normal.  Gait is shuffling speech slightly slurred. Perform finger to nose with difficulty. Tremor at rest no asterixis  Skin: Skin is warm and dry. No rash noted.  Psychiatric: He has a normal mood and affect.    ED Course  Procedures (including critical care time)  Labs Reviewed  PROTIME-INR  AMMONIA  COMPREHENSIVE METABOLIC PANEL  CBC WITH DIFFERENTIAL  URINALYSIS, ROUTINE W REFLEX MICROSCOPIC  URINE RAPID DRUG SCREEN (HOSP PERFORMED)  CARBAMAZEPINE LEVEL, TOTAL   No results found.   No diagnosis found.   Date: 09/26/2012  Rate: 100  Rhythm: sinus tachycardia  QRS Axis: normal  Intervals: normal  ST/T Wave abnormalities: normal  Conduction Disutrbances:none  Narrative Interpretation:   Old EKG Reviewed: Tracing from 06/13/2012 showed normal sinus rhythm within normal limits interpreted by me Results for orders placed during the hospital encounter of 09/26/12  PROTIME-INR      Result Value Range   Prothrombin Time 15.2  11.6 - 15.2 seconds   INR 1.22  0.00 - 1.49  AMMONIA      Result Value Range   Ammonia 67 (*) 11 - 60 umol/L  COMPREHENSIVE METABOLIC PANEL      Result Value Range   Sodium 141  135 - 145 mEq/L   Potassium 3.2 (*) 3.5 - 5.1 mEq/L   Chloride 107  96 - 112 mEq/L   CO2 23  19 - 32 mEq/L   Glucose, Bld 144 (*) 70 - 99 mg/dL   BUN 15  6 - 23 mg/dL   Creatinine, Ser 1.61  0.50 - 1.35 mg/dL   Calcium 9.4  8.4 - 09.6 mg/dL    Total Protein 6.7  6.0 - 8.3 g/dL   Albumin 3.4 (*) 3.5 - 5.2 g/dL   AST 51 (*) 0 - 37 U/L   ALT 37  0 - 53 U/L   Alkaline Phosphatase 65  39 - 117 U/L   Total Bilirubin 0.8  0.3 - 1.2 mg/dL  GFR calc non Af Amer >90  >90 mL/min   GFR calc Af Amer >90  >90 mL/min  CBC WITH DIFFERENTIAL      Result Value Range   WBC 6.5  4.0 - 10.5 K/uL   RBC 4.67  4.22 - 5.81 MIL/uL   Hemoglobin 13.3  13.0 - 17.0 g/dL   HCT 16.1 (*) 09.6 - 04.5 %   MCV 81.8  78.0 - 100.0 fL   MCH 28.5  26.0 - 34.0 pg   MCHC 34.8  30.0 - 36.0 g/dL   RDW 40.9 (*) 81.1 - 91.4 %   Platelets 77 (*) 150 - 400 K/uL   Neutrophils Relative 87 (*) 43 - 77 %   Neutro Abs 5.6  1.7 - 7.7 K/uL   Lymphocytes Relative 4 (*) 12 - 46 %   Lymphs Abs 0.3 (*) 0.7 - 4.0 K/uL   Monocytes Relative 8  3 - 12 %   Monocytes Absolute 0.5  0.1 - 1.0 K/uL   Eosinophils Relative 0  0 - 5 %   Eosinophils Absolute 0.0  0.0 - 0.7 K/uL   Basophils Relative 0  0 - 1 %   Basophils Absolute 0.0  0.0 - 0.1 K/uL  URINALYSIS, ROUTINE W REFLEX MICROSCOPIC      Result Value Range   Color, Urine YELLOW  YELLOW   APPearance CLEAR  CLEAR   Specific Gravity, Urine 1.021  1.005 - 1.030   pH 6.0  5.0 - 8.0   Glucose, UA NEGATIVE  NEGATIVE mg/dL   Hgb urine dipstick NEGATIVE  NEGATIVE   Bilirubin Urine NEGATIVE  NEGATIVE   Ketones, ur NEGATIVE  NEGATIVE mg/dL   Protein, ur NEGATIVE  NEGATIVE mg/dL   Urobilinogen, UA 0.2  0.0 - 1.0 mg/dL   Nitrite NEGATIVE  NEGATIVE   Leukocytes, UA NEGATIVE  NEGATIVE  URINE RAPID DRUG SCREEN (HOSP PERFORMED)      Result Value Range   Opiates NONE DETECTED  NONE DETECTED   Cocaine NONE DETECTED  NONE DETECTED   Benzodiazepines POSITIVE (*) NONE DETECTED   Amphetamines NONE DETECTED  NONE DETECTED   Tetrahydrocannabinol NONE DETECTED  NONE DETECTED   Barbiturates NONE DETECTED  NONE DETECTED  CARBAMAZEPINE LEVEL, TOTAL      Result Value Range   Carbamazepine Lvl <0.5 (*) 4.0 - 12.0 ug/mL  POCT I-STAT 3, BLOOD  GAS (G3+)      Result Value Range   pH, Arterial 7.516 (*) 7.350 - 7.450   pCO2 arterial 26.5 (*) 35.0 - 45.0 mmHg   pO2, Arterial 87.0  80.0 - 100.0 mmHg   Bicarbonate 21.5  20.0 - 24.0 mEq/L   TCO2 22  0 - 100 mmol/L   O2 Saturation 98.0     Patient temperature 98.6 F     Collection site RADIAL, ALLEN'S TEST ACCEPTABLE     Drawn by Operator     Sample type ARTERIAL     Dg Chest 2 View  09/26/2012  *RADIOLOGY REPORT*  Clinical Data:  Larey Seat striking head, slurred speech, history hypertension, cirrhosis  CHEST - 2 VIEW  Comparison: 01/20/2010  Findings: Normal heart size, mediastinal contours, and pulmonary vascularity. Minimal elongation of thoracic aorta noted. Question focus of vague right upper lobe infiltrate. Minimal peribronchial thickening. Remaining lungs clear. No pleural effusion or pneumothorax.  IMPRESSION: Minimal bronchitic changes with questionable right upper lobe infiltrate.   Original Report Authenticated By: Ulyses Southward, M.D.    Ct Head Wo Contrast  09/26/2012  *  RADIOLOGY REPORT*  Clinical Data: 52 year old male with altered mental status.  CT HEAD WITHOUT CONTRAST  Technique:  Contiguous axial images were obtained from the base of the skull through the vertex without contrast.  Comparison: 04/01/2012 and earlier.  Findings: Mild right maxillary sinus mucosal thickening.  Other Visualized paranasal sinuses and mastoids are clear.  Visualized orbits and scalp soft tissues are within normal limits.  No acute osseous abnormality identified.  Scaphocephaly again noted.   Stable and normal cerebral volume.  No ventriculomegaly. No midline shift, mass effect, or evidence of mass lesion.  No acute intracranial hemorrhage identified.  No suspicious intracranial vascular hyperdensity. No evidence of cortically based acute infarction identified.  IMPRESSION: Stable and normal for age noncontrast CT appearance of the brain.   Original Report Authenticated By: Erskine Speed, M.D.       Date:  09/26/2012  Rate: 100   Rhythm: sinus tachycardia  QRS Axis: normal  Intervals: normal  ST/T Wave abnormalities: normal  Conduction Disutrbances:none  Narrative Interpretation:   Old EKG Reviewed: Tracing from 06/13/2012 showed normal sinus rhythm 75 beats minute otherwise unchanged interpreted by me   MDM  11 AM patient wishes to leave AGAINST MEDICAL ADVICE.Attempted to call pts wife,no answer. Message left on voicemail He is oriented x3 he knows the president he does realize the risk of death. He ambulates without assistance and I feel that he is of sound mind refused further care. I've explained to him risk of death and worsening of condition. Eversion to see his primary care Dr. or urgent medical or go to another emergency department for further treatment I do not feel that we we have grounds restrain this patient against his will. Diagnosis #1 altered mental status 2 hypokalemia #3 dyspnea #4 respiratory alkalosis       Doug Sou, MD 09/26/12 1113  Doug Sou, MD 09/26/12 1139

## 2012-09-26 NOTE — ED Notes (Signed)
Pt up and out of bed. Pt removed all wires and is getting dressed. Pt states that he wants to leave. Pt informed that MD is reviewing his results and will come speak with him. Pt appears to be more steady on his feet than when he arrived. MD made aware

## 2012-09-26 NOTE — ED Notes (Signed)
PT informed of risks of leaving AMA. Pt states that he wasn't to leave anyway. Pt was seen by Dr. Sherri Sear and states that pt can leave AMA. Pt signed form.

## 2012-09-26 NOTE — ED Notes (Signed)
Per EMS- pt was "sluggish" this morning according to himself and friend. Pt has recently increased some of his medications. CBG 147. Pt normally ambulatory.

## 2012-09-27 ENCOUNTER — Inpatient Hospital Stay (HOSPITAL_COMMUNITY)
Admission: EM | Admit: 2012-09-27 | Discharge: 2012-09-29 | DRG: 442 | Disposition: A | Discharge status: Home / Self Care | Payer: Medicare Other | Attending: Internal Medicine | Admitting: Internal Medicine

## 2012-09-27 ENCOUNTER — Inpatient Hospital Stay (HOSPITAL_COMMUNITY): Payer: Medicare Other

## 2012-09-27 ENCOUNTER — Emergency Department (HOSPITAL_COMMUNITY): Payer: Medicare Other

## 2012-09-27 ENCOUNTER — Encounter (HOSPITAL_COMMUNITY): Payer: Self-pay | Admitting: Emergency Medicine

## 2012-09-27 DIAGNOSIS — F29 Unspecified psychosis not due to a substance or known physiological condition: Secondary | ICD-10-CM

## 2012-09-27 DIAGNOSIS — K7460 Unspecified cirrhosis of liver: Secondary | ICD-10-CM | POA: Diagnosis present

## 2012-09-27 DIAGNOSIS — B1921 Unspecified viral hepatitis C with hepatic coma: Principal | ICD-10-CM | POA: Diagnosis present

## 2012-09-27 DIAGNOSIS — N179 Acute kidney failure, unspecified: Secondary | ICD-10-CM

## 2012-09-27 DIAGNOSIS — I10 Essential (primary) hypertension: Secondary | ICD-10-CM | POA: Diagnosis present

## 2012-09-27 DIAGNOSIS — E876 Hypokalemia: Secondary | ICD-10-CM | POA: Diagnosis present

## 2012-09-27 DIAGNOSIS — R748 Abnormal levels of other serum enzymes: Secondary | ICD-10-CM | POA: Diagnosis present

## 2012-09-27 DIAGNOSIS — E870 Hyperosmolality and hypernatremia: Secondary | ICD-10-CM | POA: Diagnosis present

## 2012-09-27 DIAGNOSIS — F22 Delusional disorders: Secondary | ICD-10-CM

## 2012-09-27 DIAGNOSIS — A419 Sepsis, unspecified organism: Secondary | ICD-10-CM

## 2012-09-27 DIAGNOSIS — K7290 Hepatic failure, unspecified without coma: Secondary | ICD-10-CM | POA: Diagnosis present

## 2012-09-27 DIAGNOSIS — D72829 Elevated white blood cell count, unspecified: Secondary | ICD-10-CM | POA: Diagnosis present

## 2012-09-27 DIAGNOSIS — F1021 Alcohol dependence, in remission: Secondary | ICD-10-CM

## 2012-09-27 DIAGNOSIS — B1710 Acute hepatitis C without hepatic coma: Secondary | ICD-10-CM | POA: Diagnosis present

## 2012-09-27 DIAGNOSIS — Z87891 Personal history of nicotine dependence: Secondary | ICD-10-CM

## 2012-09-27 DIAGNOSIS — F1020 Alcohol dependence, uncomplicated: Secondary | ICD-10-CM

## 2012-09-27 DIAGNOSIS — Z6836 Body mass index (BMI) 36.0-36.9, adult: Secondary | ICD-10-CM

## 2012-09-27 DIAGNOSIS — I214 Non-ST elevation (NSTEMI) myocardial infarction: Secondary | ICD-10-CM

## 2012-09-27 DIAGNOSIS — D696 Thrombocytopenia, unspecified: Secondary | ICD-10-CM | POA: Diagnosis present

## 2012-09-27 DIAGNOSIS — R9431 Abnormal electrocardiogram [ECG] [EKG]: Secondary | ICD-10-CM | POA: Diagnosis present

## 2012-09-27 DIAGNOSIS — N390 Urinary tract infection, site not specified: Secondary | ICD-10-CM

## 2012-09-27 DIAGNOSIS — F1920 Other psychoactive substance dependence, uncomplicated: Secondary | ICD-10-CM | POA: Diagnosis present

## 2012-09-27 DIAGNOSIS — F1410 Cocaine abuse, uncomplicated: Secondary | ICD-10-CM | POA: Diagnosis present

## 2012-09-27 DIAGNOSIS — E669 Obesity, unspecified: Secondary | ICD-10-CM | POA: Diagnosis present

## 2012-09-27 DIAGNOSIS — E872 Acidosis, unspecified: Secondary | ICD-10-CM | POA: Diagnosis present

## 2012-09-27 DIAGNOSIS — F32A Depression, unspecified: Secondary | ICD-10-CM

## 2012-09-27 DIAGNOSIS — F319 Bipolar disorder, unspecified: Secondary | ICD-10-CM

## 2012-09-27 DIAGNOSIS — F329 Major depressive disorder, single episode, unspecified: Secondary | ICD-10-CM | POA: Diagnosis present

## 2012-09-27 DIAGNOSIS — D649 Anemia, unspecified: Secondary | ICD-10-CM | POA: Diagnosis present

## 2012-09-27 DIAGNOSIS — M549 Dorsalgia, unspecified: Secondary | ICD-10-CM | POA: Diagnosis present

## 2012-09-27 DIAGNOSIS — E860 Dehydration: Secondary | ICD-10-CM | POA: Diagnosis present

## 2012-09-27 DIAGNOSIS — D62 Acute posthemorrhagic anemia: Secondary | ICD-10-CM | POA: Diagnosis present

## 2012-09-27 DIAGNOSIS — F1994 Other psychoactive substance use, unspecified with psychoactive substance-induced mood disorder: Secondary | ICD-10-CM | POA: Diagnosis present

## 2012-09-27 DIAGNOSIS — Z79899 Other long term (current) drug therapy: Secondary | ICD-10-CM

## 2012-09-27 DIAGNOSIS — F411 Generalized anxiety disorder: Secondary | ICD-10-CM | POA: Diagnosis present

## 2012-09-27 DIAGNOSIS — G9340 Encephalopathy, unspecified: Secondary | ICD-10-CM | POA: Diagnosis present

## 2012-09-27 HISTORY — DX: Bipolar disorder, unspecified: F31.9

## 2012-09-27 HISTORY — DX: Depression, unspecified: F32.A

## 2012-09-27 HISTORY — DX: Major depressive disorder, single episode, unspecified: F32.9

## 2012-09-27 LAB — POCT I-STAT, CHEM 8
BUN: 22 mg/dL (ref 6–23)
Calcium, Ion: 1.15 mmol/L (ref 1.12–1.23)
Creatinine, Ser: 0.8 mg/dL (ref 0.50–1.35)
Glucose, Bld: 125 mg/dL — ABNORMAL HIGH (ref 70–99)
TCO2: 25 mmol/L (ref 0–100)

## 2012-09-27 LAB — URINALYSIS, ROUTINE W REFLEX MICROSCOPIC
Glucose, UA: NEGATIVE mg/dL
Glucose, UA: NEGATIVE mg/dL
Ketones, ur: >80 mg/dL — AB
Nitrite: NEGATIVE
Protein, ur: 100 mg/dL — AB
Specific Gravity, Urine: 1.031 — ABNORMAL HIGH (ref 1.005–1.030)
pH: 5.5 (ref 5.0–8.0)
pH: 6 (ref 5.0–8.0)

## 2012-09-27 LAB — HEPATIC FUNCTION PANEL
AST: 526 U/L — ABNORMAL HIGH (ref 0–37)
Albumin: 3.7 g/dL (ref 3.5–5.2)
Alkaline Phosphatase: 63 U/L (ref 39–117)
Bilirubin, Direct: 0.7 mg/dL — ABNORMAL HIGH (ref 0.0–0.3)
Total Bilirubin: 2.1 mg/dL — ABNORMAL HIGH (ref 0.3–1.2)

## 2012-09-27 LAB — CBC
HCT: 43.2 % (ref 39.0–52.0)
Hemoglobin: 15.3 g/dL (ref 13.0–17.0)
MCV: 80.7 fL (ref 78.0–100.0)
WBC: 13 10*3/uL — ABNORMAL HIGH (ref 4.0–10.5)

## 2012-09-27 LAB — TROPONIN I: Troponin I: 0.58 ng/mL (ref <0.30)

## 2012-09-27 LAB — TSH: TSH: 0.182 u[IU]/mL — ABNORMAL LOW (ref 0.350–4.500)

## 2012-09-27 LAB — COMPREHENSIVE METABOLIC PANEL
BUN: 21 mg/dL (ref 6–23)
CO2: 23 mEq/L (ref 19–32)
Calcium: 8.7 mg/dL (ref 8.4–10.5)
GFR calc Af Amer: >90 mL/min (ref >90)
GFR calc non Af Amer: >90 mL/min (ref >90)
Glucose, Bld: 148 mg/dL — ABNORMAL HIGH (ref 70–99)
Total Protein: 6.4 g/dL (ref 6.0–8.3)

## 2012-09-27 LAB — URINE MICROSCOPIC-ADD ON

## 2012-09-27 LAB — RAPID URINE DRUG SCREEN, HOSP PERFORMED: Benzodiazepines: POSITIVE — AB

## 2012-09-27 LAB — POCT I-STAT TROPONIN I

## 2012-09-27 LAB — AMMONIA: Ammonia: 56 umol/L (ref 11–60)

## 2012-09-27 MED ORDER — AMANTADINE HCL 100 MG PO CAPS
100.0000 mg | ORAL_CAPSULE | Freq: Three times a day (TID) | ORAL | Status: DC
  Administered 2012-09-27 – 2012-09-29 (×6): 100 mg via ORAL
  Filled 2012-09-27 (×8): qty 1

## 2012-09-27 MED ORDER — VITAMIN B-1 100 MG PO TABS
100.0000 mg | ORAL_TABLET | Freq: Every day | ORAL | Status: DC
  Administered 2012-09-27 – 2012-09-29 (×3): 100 mg via ORAL
  Filled 2012-09-27 (×3): qty 1

## 2012-09-27 MED ORDER — SODIUM CHLORIDE 0.9 % IV BOLUS (SEPSIS)
1000.0000 mL | Freq: Once | INTRAVENOUS | Status: AC
  Administered 2012-09-27: 1000 mL via INTRAVENOUS

## 2012-09-27 MED ORDER — ONDANSETRON HCL 4 MG/2ML IJ SOLN: 4.0000 mg | Freq: Four times a day (QID) | INTRAMUSCULAR | Status: DC | PRN

## 2012-09-27 MED ORDER — DEXTROSE 5 % IV SOLN
INTRAVENOUS | Status: DC
  Administered 2012-09-27: 125 mL via INTRAVENOUS
  Administered 2012-09-28: 08:00:00 via INTRAVENOUS

## 2012-09-27 MED ORDER — VITAMIN B-1 100 MG PO TABS
100.0000 mg | ORAL_TABLET | Freq: Every day | ORAL | Status: DC
  Filled 2012-09-27 (×3): qty 1

## 2012-09-27 MED ORDER — FLUOXETINE HCL 20 MG PO CAPS
20.0000 mg | ORAL_CAPSULE | Freq: Every day | ORAL | Status: DC
  Administered 2012-09-28 – 2012-09-29 (×2): 20 mg via ORAL
  Filled 2012-09-27 (×3): qty 1

## 2012-09-27 MED ORDER — ADULT MULTIVITAMIN W/MINERALS CH
1.0000 | ORAL_TABLET | Freq: Every day | ORAL | Status: DC
  Administered 2012-09-27 – 2012-09-29 (×3): 1 via ORAL
  Filled 2012-09-27 (×3): qty 1

## 2012-09-27 MED ORDER — OXCARBAZEPINE 150 MG PO TABS
150.0000 mg | ORAL_TABLET | Freq: Two times a day (BID) | ORAL | Status: DC
  Administered 2012-09-27 – 2012-09-29 (×4): 150 mg via ORAL
  Filled 2012-09-27 (×5): qty 1

## 2012-09-27 MED ORDER — LORAZEPAM 2 MG/ML IJ SOLN: 1.0000 mg | Freq: Four times a day (QID) | INTRAMUSCULAR | Status: DC | PRN

## 2012-09-27 MED ORDER — PANTOPRAZOLE SODIUM 40 MG PO TBEC
40.0000 mg | DELAYED_RELEASE_TABLET | Freq: Every day | ORAL | Status: DC
  Administered 2012-09-28 – 2012-09-29 (×2): 40 mg via ORAL
  Filled 2012-09-27 (×2): qty 1

## 2012-09-27 MED ORDER — SODIUM CHLORIDE 0.9 % IV SOLN
1000.0000 mL | INTRAVENOUS | Status: DC
  Administered 2012-09-27: 1000 mL via INTRAVENOUS

## 2012-09-27 MED ORDER — POLYETHYLENE GLYCOL 3350 17 G PO PACK
17.0000 g | PACK | Freq: Every day | ORAL | Status: DC | PRN
  Filled 2012-09-27: qty 1

## 2012-09-27 MED ORDER — SODIUM CHLORIDE 0.9 % IJ SOLN
3.0000 mL | Freq: Two times a day (BID) | INTRAMUSCULAR | Status: DC
  Administered 2012-09-27 – 2012-09-29 (×4): 3 mL via INTRAVENOUS

## 2012-09-27 MED ORDER — ONDANSETRON HCL 4 MG PO TABS: 4.0000 mg | ORAL_TABLET | Freq: Four times a day (QID) | ORAL | Status: DC | PRN

## 2012-09-27 MED ORDER — SODIUM CHLORIDE 0.9 % IV SOLN
1000.0000 mL | Freq: Once | INTRAVENOUS | Status: AC
  Administered 2012-09-27: 1000 mL via INTRAVENOUS

## 2012-09-27 MED ORDER — ACETAMINOPHEN 325 MG PO TABS
325.0000 mg | ORAL_TABLET | Freq: Four times a day (QID) | ORAL | Status: DC | PRN
  Administered 2012-09-28 – 2012-09-29 (×2): 325 mg via ORAL
  Filled 2012-09-27 (×2): qty 1

## 2012-09-27 MED ORDER — VANCOMYCIN HCL 10 G IV SOLR
1500.0000 mg | INTRAVENOUS | Status: DC
  Administered 2012-09-27: 1500 mg via INTRAVENOUS
  Filled 2012-09-27: qty 1500

## 2012-09-27 MED ORDER — LACTULOSE 10 GM/15ML PO SOLN
30.0000 g | Freq: Two times a day (BID) | ORAL | Status: DC
  Administered 2012-09-27 – 2012-09-29 (×4): 30 g via ORAL
  Filled 2012-09-27 (×6): qty 45

## 2012-09-27 MED ORDER — BISACODYL 10 MG RE SUPP: 10.0000 mg | Freq: Every day | RECTAL | Status: DC | PRN

## 2012-09-27 MED ORDER — ACETAMINOPHEN 325 MG PO TABS
650.0000 mg | ORAL_TABLET | Freq: Once | ORAL | Status: AC
  Administered 2012-09-27: 650 mg via ORAL
  Filled 2012-09-27: qty 2

## 2012-09-27 MED ORDER — BENZTROPINE MESYLATE 1 MG PO TABS
1.0000 mg | ORAL_TABLET | Freq: Two times a day (BID) | ORAL | Status: DC | PRN
  Filled 2012-09-27: qty 1

## 2012-09-27 MED ORDER — POTASSIUM CHLORIDE CRYS ER 20 MEQ PO TBCR
40.0000 meq | EXTENDED_RELEASE_TABLET | Freq: Once | ORAL | Status: AC
  Administered 2012-09-27: 40 meq via ORAL
  Filled 2012-09-27: qty 2

## 2012-09-27 MED ORDER — FOLIC ACID 1 MG PO TABS
1.0000 mg | ORAL_TABLET | Freq: Every day | ORAL | Status: DC
  Filled 2012-09-27 (×3): qty 1

## 2012-09-27 MED ORDER — TRAZODONE HCL 100 MG PO TABS
100.0000 mg | ORAL_TABLET | Freq: Every evening | ORAL | Status: DC | PRN
  Administered 2012-09-28: 100 mg via ORAL
  Filled 2012-09-27: qty 1

## 2012-09-27 MED ORDER — ADULT MULTIVITAMIN W/MINERALS CH: 1.0000 | ORAL_TABLET | Freq: Every day | ORAL | Status: DC

## 2012-09-27 MED ORDER — LORAZEPAM 1 MG PO TABS
1.0000 mg | ORAL_TABLET | Freq: Four times a day (QID) | ORAL | Status: DC | PRN
  Administered 2012-09-28: 1 mg via ORAL
  Filled 2012-09-27: qty 1

## 2012-09-27 MED ORDER — DEXTROSE 5 % IV SOLN
1.0000 g | Freq: Once | INTRAVENOUS | Status: AC
  Administered 2012-09-27: 1 g via INTRAVENOUS
  Filled 2012-09-27: qty 10

## 2012-09-27 MED ORDER — FOLIC ACID 1 MG PO TABS
1.0000 mg | ORAL_TABLET | Freq: Every day | ORAL | Status: DC
  Administered 2012-09-27 – 2012-09-29 (×3): 1 mg via ORAL
  Filled 2012-09-27 (×3): qty 1

## 2012-09-27 MED ORDER — HYDROXYZINE HCL 50 MG PO TABS
50.0000 mg | ORAL_TABLET | Freq: Three times a day (TID) | ORAL | Status: DC | PRN
  Filled 2012-09-27: qty 1

## 2012-09-27 MED ORDER — HALOPERIDOL 5 MG PO TABS
5.0000 mg | ORAL_TABLET | Freq: Every day | ORAL | Status: DC
  Administered 2012-09-27 – 2012-09-28 (×2): 5 mg via ORAL
  Filled 2012-09-27 (×3): qty 1

## 2012-09-27 MED ORDER — ACETAMINOPHEN 650 MG RE SUPP: 650.0000 mg | Freq: Four times a day (QID) | RECTAL | Status: DC | PRN

## 2012-09-27 MED ORDER — MAGNESIUM CITRATE PO SOLN
1.0000 | Freq: Once | ORAL | Status: AC | PRN
  Filled 2012-09-27: qty 296

## 2012-09-27 MED ORDER — PIPERACILLIN-TAZOBACTAM 3.375 G IVPB
3.3750 g | Freq: Three times a day (TID) | INTRAVENOUS | Status: DC
  Administered 2012-09-27 – 2012-09-28 (×3): 3.375 g via INTRAVENOUS
  Filled 2012-09-27 (×4): qty 50

## 2012-09-27 MED ORDER — ALUM & MAG HYDROXIDE-SIMETH 200-200-20 MG/5ML PO SUSP: 30.0000 mL | Freq: Four times a day (QID) | ORAL | Status: DC | PRN

## 2012-09-27 MED ORDER — ASPIRIN 81 MG PO CHEW
324.0000 mg | CHEWABLE_TABLET | Freq: Once | ORAL | Status: AC
  Administered 2012-09-27: 324 mg via ORAL
  Filled 2012-09-27: qty 4

## 2012-09-27 MED ORDER — THIAMINE HCL 100 MG/ML IJ SOLN
100.0000 mg | Freq: Every day | INTRAMUSCULAR | Status: DC
  Filled 2012-09-27 (×3): qty 1

## 2012-09-27 NOTE — Consult Note (Signed)
Admit date: 09/27/2012 Referring Physician  Dr. Janee Morn Primary Physician Georgann Housekeeper, MD Primary Cardiologist  None Reason for Consultation  Elevated troponin  HPI: 52 year old male with cirrhosis who presented with altered mental status, extreme fatigue, weakness, fever, hypernatremia, lactic acidosis, elevated liver enzymes and mildly positive troponin, point-of-care, 0.43.  He is a history of anxiety, depression, bipolar disorder as well as thrombocytopenia and on EKG, his QT interval is quite prolonged currently. Yesterday his QT interval was prolonged but not as severe as it is today. Prior to admission he staggered, fell down, fell in the grass and was too weak to get up. He was confused. Denies drug/alcohol use. Clearly dehydrated. Does not describe chest pain, fevers, vomiting, abdominal pain, diarrhea. He has had hallucinations previously.    PMH:   Past Medical History  Diagnosis Date  . Thrombocytopenia   . Cirrhosis   . Hepatitis C   . Hypertension   . Anxiety   . Anemia   . Blood transfusion   . Obesity   . Pancreatitis   . Bipolar disorder, unspecified 09/27/2012  . Depression 09/27/2012    PSH:   Past Surgical History  Procedure Laterality Date  . Tonsillectomy     Allergies:  Review of patient's allergies indicates no known allergies. Prior to Admit Meds:   Prescriptions prior to admission  Medication Sig Dispense Refill  . acetaminophen (TYLENOL) 325 MG tablet Take 3 tablets (975 mg total) by mouth every 6 (six) hours as needed for pain (for persistant neck pain).      Marland Kitchen amantadine (SYMMETREL) 100 MG capsule Take 100 mg by mouth 3 (three) times daily.      . benztropine (COGENTIN) 1 MG tablet Take 1 tablet (1 mg total) by mouth 2 (two) times daily as needed. For prevention of drug induced involuntary movements  60 tablet  0  . FLUoxetine (PROZAC) 20 MG capsule Take 20 mg by mouth daily.      . haloperidol (HALDOL) 5 MG tablet Take 1 tablet (5 mg total) by mouth  at bedtime. For mood control  30 tablet  0  . hydrOXYzine (ATARAX/VISTARIL) 50 MG tablet Take 1 tablet (50 mg total) by mouth at bedtime and may repeat dose one time if needed. For anxiety symptoms  60 tablet  0  . lactulose (CHRONULAC) 10 GM/15ML solution Take 45 mLs (30 g total) by mouth 2 (two) times daily. For constipation  240 mL    . lisinopril-hydrochlorothiazide (PRINZIDE,ZESTORETIC) 20-12.5 MG per tablet Take 1 tablet by mouth daily. For high blood pressure control  30 tablet    . OXcarbazepine (TRILEPTAL) 150 MG tablet Take 150 mg by mouth 2 (two) times daily. For mood stabilization      . traZODone (DESYREL) 100 MG tablet Take 1 tablet (100 mg total) by mouth at bedtime as needed for sleep. For depression/sleep  30 tablet  0   Fam HX:    Family History  Problem Relation Age of Onset  . Cancer - Other Mother     Uterine  . Coronary artery disease Father   . Coronary artery disease Paternal Grandfather   . Hypertension Father   . Hypertension Paternal Grandfather    Social HX:    History   Social History  . Marital Status: Married    Spouse Name: N/A    Number of Children: N/A  . Years of Education: N/A   Occupational History  . Not on file.   Social History Main Topics  .  Smoking status: Former Smoker    Types: Cigarettes    Quit date: 11/05/2003  . Smokeless tobacco: Not on file  . 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 states last etoh was 2011  . Drug Use: Yes    Special: Cocaine, Hydrocodone, Benzodiazepines     Comment: opiates   . Sexually Active: No   Other Topics Concern  . Not on file   Social History Narrative  . No narrative on file    Reports that he quit smoking about 8 years ago. His smoking use included Cigarettes. He smoked 0.00 packs per day. He does not have any smokeless tobacco history on file. He reports that drinks alcohol. He reports that he uses illicit drugs (Cocaine, Hydrocodone, and  Benzodiazepines).     ROS:  All 11 ROS were addressed and are negative except what is stated in the HPI  Physical Exam: Blood pressure 121/80, pulse 99, temperature 99.7 F (37.6 C), temperature source Rectal, resp. rate 18, SpO2 95.00%.    General: Disheveled, bruises, lacerations, no current distress Head: Eyes PERRLA, No xanthomas.   Normal cephalic and atramatic  Lungs:   Clear bilaterally to auscultation and percussion. Normal respiratory effort. No wheezes, no rales. Heart:  Tachycardic/ HRRR S1 S2 Pulses are 2+ & equal. No appreciable murmurs, no rubs, no gallops           No carotid bruit. No JVD.  No abdominal bruits. No femoral bruits. Abdomen: Bowel sounds are positive, abdomen soft and non-tender without masses. No hepatosplenomegaly. Msk:  Back normal. Decreased generalized strength and tone for age. Extremities:   No clubbing, cyanosis or edema.  DP +1 Neuro: Alert and oriented X 3, non-focal, MAE x 4, asked to call wife. GU: Deferred Rectal: Deferred Psych:  Slow speech but responds appropriately    Labs:   Lab Results  Component Value Date   WBC 13.0* 09/27/2012   HGB 15.3 09/27/2012   HCT 45.0 09/27/2012   MCV 80.7 09/27/2012   PLT 99* 09/27/2012    Recent Labs Lab 09/26/12 0830 09/27/12 1159 09/27/12 1232  NA 141  --  151*  K 3.2*  --  3.5  CL 107  --  116*  CO2 23  --   --   BUN 15  --  22  CREATININE 0.78  --  0.80  CALCIUM 9.4  --   --   PROT 6.7 7.4  --   BILITOT 0.8 2.1*  --   ALKPHOS 65 63  --   ALT 37 123*  --   AST 51* 526*  --   GLUCOSE 144*  --  125*   No results found for this basename: PTT   Lab Results  Component Value Date   INR 1.22 09/26/2012   INR 1.29 10/30/2011   INR 1.50* 01/20/2010   Lab Results  Component Value Date   CKTOTAL 236* 01/21/2010   CKMB 2.4 01/21/2010   TROPONINI  Value: 0.03        NO INDICATION OF MYOCARDIAL INJURY. 01/21/2010        Radiology:  Dg Chest 2 View  09/26/2012  *RADIOLOGY REPORT*  Clinical Data:   Larey Seat striking head, slurred speech, history hypertension, cirrhosis  CHEST - 2 VIEW  Comparison: 01/20/2010  Findings: Normal heart size, mediastinal contours, and pulmonary vascularity. Minimal elongation of thoracic aorta noted. Question focus of vague right upper lobe infiltrate. Minimal peribronchial thickening. Remaining  lungs clear. No pleural effusion or pneumothorax.  IMPRESSION: Minimal bronchitic changes with questionable right upper lobe infiltrate.   Original Report Authenticated By: Ulyses Southward, M.D.    Ct Head Wo Contrast  09/26/2012  *RADIOLOGY REPORT*  Clinical Data: 52 year old male with altered mental status.  CT HEAD WITHOUT CONTRAST  Technique:  Contiguous axial images were obtained from the base of the skull through the vertex without contrast.  Comparison: 04/01/2012 and earlier.  Findings: Mild right maxillary sinus mucosal thickening.  Other Visualized paranasal sinuses and mastoids are clear.  Visualized orbits and scalp soft tissues are within normal limits.  No acute osseous abnormality identified.  Scaphocephaly again noted.   Stable and normal cerebral volume.  No ventriculomegaly. No midline shift, mass effect, or evidence of mass lesion.  No acute intracranial hemorrhage identified.  No suspicious intracranial vascular hyperdensity. No evidence of cortically based acute infarction identified.  IMPRESSION: Stable and normal for age noncontrast CT appearance of the brain.   Original Report Authenticated By: Erskine Speed, M.D.    Dg Chest Portable 1 View  09/27/2012  *RADIOLOGY REPORT*  Clinical Data: Altered mental status.  PORTABLE CHEST - 1 VIEW  Comparison: PA and lateral chest 09/26/2012.  Findings: Lungs are clear.  Heart size normal.  No pneumothorax or pleural effusion.  IMPRESSION: Negative chest.   Original Report Authenticated By: Holley Dexter, M.D.    Personally viewed.  EKG:  Sinus tachycardia, prolonged QT (increased from previous EKG yesterday), nonspecific ST  changes. Personally viewed.   ASSESSMENT/PLAN:   52 year old male with cirrhosis, altered mental status, hypernatremia, profound weakness, prolonged QT, hypertension, psychiatric illness, mildly elevated troponin.  -Mildly elevated troponin of 0.43 is likely secondary to myocardial strain in the setting of acute metabolic illness/underlying medical illness, lactic acidosis. I agree currently with echocardiogram for further evaluation of left ventricular function. At this point, I would not advocate for heparin/ACS dose unless troponin becomes greatly elevated. Aspirin is not unreasonable in the setting as long as it is not felt to be a bleeding risk in the setting of his cirrhosis.  -Prolonged QT interval-concerning length of QT, even when compared to prior EKG from yesterday. He is on psychiatric medications as listed above. I would strongly recommend consultation with his psychiatrist and possible adjustment of these medications. Of course, as is metabolic derangement improves, his QT interval may improve as well. Continue to monitor closely. Agree with telemetry.  Donato Schultz, MD  09/27/2012  5:01 PM

## 2012-09-27 NOTE — H&P (Signed)
[facility] History and Physical  [name] [name] WUJ:[id number] DOB: 1960/10/30 DOA: 09/27/2012  Referring physician: Dr Effie Shy PCP: Georgann Housekeeper, MD  Specialists:   Chief Complaint: AMS  HPI: [name] [name] is a 52 y.o. male with past medical history of polysubstance abuse, patient states was in rehabilitation friend of [facility] for benzos and OP with use, history of hepatitis C and cirrhosis, hypertension, anxiety, depression, bipolar disorder, pancreatitis, thrombocytopenia who presents to the ED with generalized weakness and some altered mental status. Patient stated that he staggered and fell down one day prior to admission and the grass and was too weak to get up. Patient stated he then lost his wallet he could see but there were too many rocks and was unable to get to it. Patient stated he for help and someone called 911. Patient was subsequently brought to the ED. Patient does endorse some generalized weakness, palpitations, shortness of breath on ambulation. Patient denies any fevers, no chills, no chest pain, no nausea, no vomiting, no melena, no hematemesis, no hematochezia, no abdominal pain, no dysuria, no diarrhea, no constipation. Patient does endorse some visual and auditory hallucinations 2 weeks prior to admission which has since resolved. Patient denies any suicidal ideation. Patient denies any homicidal ideation. Patient was previously seen in the ED one day prior to admission with lethargy and altered mental status however left AGAINST MEDICAL ADVICE. At that time patient stated that he took too much Tegretol which was being used to stabilize his mood. Patient denies any alcohol use or any drug use. Patient was seen in the ED i-STAT 8 was obtained and showed a sodium of 151 chloride of 116 potassium of 3.5 otherwise was within normal limits. Lactic acid level was elevated at 3.68. Urinalysis which was done a specific gravity of 1.030 was nitrite negative trace leukocytes  large hemoglobin. EKG which was done showed a sinus tachycardia. Chest x-ray done was negative. H&H which was done showed a hemoglobin of 15.3 hematocrit of 45. CBC done had a white count of 13.0 and a platelet count of 99. ED physician felt patient likely had a heat stroke as he was laying on the ground and subsequently placed on a cool place the patient on a cooling blanket and gave patient some IV fluids. Initial set of troponin which came back elevated at 0.43. We were called to admit the patient for further evaluation and management.  Review of Systems: The patient denies anorexia, fever, weight loss,, vision loss, decreased hearing, hoarseness, chest pain, syncope, dyspnea on exertion, peripheral edema, balance deficits, hemoptysis, abdominal pain, melena, hematochezia, severe indigestion/heartburn, hematuria, incontinence, genital sores, muscle weakness, suspicious skin lesions, transient blindness, difficulty walking, depression, unusual weight change, abnormal bleeding, enlarged lymph nodes, angioedema, and breast masses.    Past Medical History  Diagnosis Date  . Thrombocytopenia   . Cirrhosis   . Hepatitis C   . Hypertension   . Anxiety   . Anemia   . Blood transfusion   . Obesity   . Pancreatitis   . Bipolar disorder, unspecified 09/27/2012  . Depression 09/27/2012   Past Surgical History  Procedure Laterality Date  . Tonsillectomy     Social History:  reports that he quit smoking about 8 years ago. His smoking use included Cigarettes. He smoked 0.00 packs per day. He does not have any smokeless tobacco history on file. He reports that  drinks alcohol. He reports that he uses illicit drugs (Cocaine, Hydrocodone, and Benzodiazepines).  No Known Allergies  Family  History  Problem Relation Age of Onset  . Cancer - Other Mother     Uterine  . Coronary artery disease Father   . Coronary artery disease Paternal Grandfather   . Hypertension Father   . Hypertension Paternal  Grandfather     Prior to Admission medications   Medication Sig Start Date End Date Taking? Authorizing Provider  acetaminophen (TYLENOL) 325 MG tablet Take 3 tablets (975 mg total) by mouth every 6 (six) hours as needed for pain (for persistant neck pain). 09/20/12   Sanjuana Kava, NP  amantadine (SYMMETREL) 100 MG capsule Take 100 mg by mouth 3 (three) times daily.    Historical Provider, MD  benztropine (COGENTIN) 1 MG tablet Take 1 tablet (1 mg total) by mouth 2 (two) times daily as needed. For prevention of drug induced involuntary movements 09/20/12   Sanjuana Kava, NP  FLUoxetine (PROZAC) 20 MG capsule Take 20 mg by mouth daily.    Historical Provider, MD  haloperidol (HALDOL) 5 MG tablet Take 1 tablet (5 mg total) by mouth at bedtime. For mood control 09/20/12   Sanjuana Kava, NP  hydrOXYzine (ATARAX/VISTARIL) 50 MG tablet Take 1 tablet (50 mg total) by mouth at bedtime and may repeat dose one time if needed. For anxiety symptoms 09/20/12   Sanjuana Kava, NP  lactulose (CHRONULAC) 10 GM/15ML solution Take 45 mLs (30 g total) by mouth 2 (two) times daily. For constipation 09/20/12   Sanjuana Kava, NP  lisinopril-hydrochlorothiazide (PRINZIDE,ZESTORETIC) 20-12.5 MG per tablet Take 1 tablet by mouth daily. For high blood pressure control 09/20/12   Sanjuana Kava, NP  OXcarbazepine (TRILEPTAL) 150 MG tablet Take 150 mg by mouth 2 (two) times daily. For mood stabilization 09/20/12   Sanjuana Kava, NP  traZODone (DESYREL) 100 MG tablet Take 1 tablet (100 mg total) by mouth at bedtime as needed for sleep. For depression/sleep 09/20/12   Sanjuana Kava, NP   Physical Exam: Filed Vitals:   09/27/12 1515 09/27/12 1530 09/27/12 1545 09/27/12 1629  BP: 120/78 122/89 121/80   Pulse: 100 101 99   Temp: 99.9 F (37.7 C) 99.9 F (37.7 C) 99.7 F (37.6 C)   TempSrc:      Resp: 22 12 18    SpO2: 96% 96% 95% 95%     General:  Disheveled looking with multiple bruises and lacerations on his face and arms.  Eyes:  Pupils equal round and reactive to light and accommodation. Extraocular movements intact.  ENT: Oropharynx clear, no lesions, no exudates. Dry mucous membranes.  Neck: Supple with no lymphadenopathy. No JVD.  Cardiovascular: Tachycardic regular rhythm no murmurs rubs or gallops  Respiratory: Clear to auscultation bilaterally no wheezing, no crackles, no rhonchi  Abdomen: Soft, slight tenderness to palpation in the right upper quadrant, positive bowel sounds, nondistended,  Skin: No rashes or lesions.  Musculoskeletal: 4/5 BUE STRENGTH, 4/5 BLE strength  Psychiatric: Normal mood. Normal affect. Fair insight. Fair judgment.  Neurologic: Alert and oriented x3. Cranial nerves II through XII are grossly intact. No focal deficits.  Labs on Admission:  Basic Metabolic Panel:  Recent Labs Lab 09/26/12 0830 09/27/12 1232  NA 141 151*  K 3.2* 3.5  CL 107 116*  CO2 23  --   GLUCOSE 144* 125*  BUN 15 22  CREATININE 0.78 0.80  CALCIUM 9.4  --    Liver Function Tests:  Recent Labs Lab 09/26/12 0830 09/27/12 1159  AST 51* 526*  ALT 37 123*  ALKPHOS 65 63  BILITOT 0.8 2.1*  PROT 6.7 7.4  ALBUMIN 3.4* 3.7   No results found for this basename: LIPASE, AMYLASE,  in the last 168 hours  Recent Labs Lab 09/26/12 0830  AMMONIA 67*   CBC:  Recent Labs Lab 09/26/12 0830 09/27/12 1159 09/27/12 1232  WBC 6.5 13.0*  --   NEUTROABS 5.6  --   --   HGB 13.3 15.3 15.3  HCT 38.2* 43.2 45.0  MCV 81.8 80.7  --   PLT 77* 99*  --    Cardiac Enzymes: No results found for this basename: CKTOTAL, CKMB, CKMBINDEX, TROPONINI,  in the last 168 hours  BNP (last 3 results) No results found for this basename: PROBNP,  in the last 8760 hours CBG: No results found for this basename: GLUCAP,  in the last 168 hours  Radiological Exams on Admission: Dg Chest 2 View  09/26/2012  *RADIOLOGY REPORT*  Clinical Data:  Larey Seat striking head, slurred speech, history hypertension, cirrhosis  CHEST  - 2 VIEW  Comparison: 01/20/2010  Findings: Normal heart size, mediastinal contours, and pulmonary vascularity. Minimal elongation of thoracic aorta noted. Question focus of vague right upper lobe infiltrate. Minimal peribronchial thickening. Remaining lungs clear. No pleural effusion or pneumothorax.  IMPRESSION: Minimal bronchitic changes with questionable right upper lobe infiltrate.   Original Report Authenticated By: Ulyses Southward, M.D.    Ct Head Wo Contrast  09/26/2012  *RADIOLOGY REPORT*  Clinical Data: 52 year old male with altered mental status.  CT HEAD WITHOUT CONTRAST  Technique:  Contiguous axial images were obtained from the base of the skull through the vertex without contrast.  Comparison: 04/01/2012 and earlier.  Findings: Mild right maxillary sinus mucosal thickening.  Other Visualized paranasal sinuses and mastoids are clear.  Visualized orbits and scalp soft tissues are within normal limits.  No acute osseous abnormality identified.  Scaphocephaly again noted.   Stable and normal cerebral volume.  No ventriculomegaly. No midline shift, mass effect, or evidence of mass lesion.  No acute intracranial hemorrhage identified.  No suspicious intracranial vascular hyperdensity. No evidence of cortically based acute infarction identified.  IMPRESSION: Stable and normal for age noncontrast CT appearance of the brain.   Original Report Authenticated By: Erskine Speed, M.D.    Dg Chest Portable 1 View  09/27/2012  *RADIOLOGY REPORT*  Clinical Data: Altered mental status.  PORTABLE CHEST - 1 VIEW  Comparison: PA and lateral chest 09/26/2012.  Findings: Lungs are clear.  Heart size normal.  No pneumothorax or pleural effusion.  IMPRESSION: Negative chest.   Original Report Authenticated By: Holley Dexter, M.D.     EKG: Independently reviewed. Sinus tachycardia  Assessment/Plan Principal Problem:   Sepsis Active Problems:   HEPATITIS C   ANEMIA, SECONDARY TO ACUTE BLOOD LOSS    THROMBOCYTOPENIA   CIRRHOSIS   BACK PAIN, CHRONIC   ALCOHOL ABUSE, HX OF   Hepatic encephalopathy   Major depression   Cocaine abuse   Polysubstance dependence   Hypernatremia   Dehydration   Bipolar disorder, unspecified   Depression   Acute encephalopathy   Cardiac enzymes elevated  #1 sepsis Personable source. Patient is presenting with a fever, leukocytosis, tachycardia. Chest x-ray is negative for acute infiltrate. Urinalysis is negative. check blood cultures x2. Check urine cultures x2. Pro calcitonin levels are pending. Repeat lactic acid level in the morning. Hydrated with IV fluids. Placed empirically on IV vancomycin and Zosyn. Follow.  #2 acute encephalopathy Likely secondary to problem #1 versus heat stroke.  Per ED physician patient more alert than on presentation and patient comfort sent and interactive. Patient is alert and oriented x3. Cultures are pending. Continue empiric IV vancomycin and Zosyn.  #3 hypernatremia Likely secondary to hypovolemic hypernatremia. We'll place empirically: D5W. Follow.  4 leukocytosis Questionable etiology. Likely secondary to sepsis. Blood cultures are pending. Urine cultures are pending. Placed empirically on IV vancomycin and Zosyn. Follow.  #5 dehydration IV fluids.  #6 abnormal cardiac enzymes Likely troponin leak secondary to problem #1. We'll cycle cardiac enzymes every 6 hours x3. Check a 2-D echo. Cardiology consultation is pending.  #7 bipolar disorder/depression Resume home regimen of Prozac, Trileptal, Cogentin, Haldol.  #8 history of alcohol abuse WIll place on a Ci[place] protocol.  #9 prophylaxis PPI for GI prophylaxis. Lovenox for DVT prophylaxis.    Code Status: Full Family Communication: Updated patient. Disposition Plan: Admit to SDU  Time spent: 70 mins  [facility] Pager [phone]  If 7PM-7AM, please contact night-coverage [email or web address] Password [facility] 09/27/2012, 5:04  PM

## 2012-09-27 NOTE — Progress Notes (Signed)
Patient arrived from ED, on O2, IVF running, had multiple open abrasions covered in dirt and grass to his left elbow, both knees, forehead, lips. Brusing to his chest and sunburn to chest.Patient admitted to 3300.

## 2012-09-27 NOTE — ED Provider Notes (Signed)
History     CSN: [id number]  Arrival date & time 09/27/12  1125   First MD Initiated Contact with Patient 09/27/12 1204      Chief Complaint  Patient presents with  . Altered Mental Status    (Consider location/radiation/quality/duration/timing/severity/associated sxs/prior treatment) HPI Comments: [name] [name] is a 52 y.o. male who states he fell on the ground today, outside of his home, because he was weak. He, states that a neighbor called the ambulance to bring him here. He injured his face, in the fall. By report, he was lethargic when EMS got there, and difficult to arouse. Since arriving here. He has been alert and conversant. He, states that he is dehydrated. He was in ED yesterday, but left AGAINST MEDICAL ADVICE prior to completion of treatment. He feels that yesterday. The problem was taking too much Tegretol. He, states that yesterday, he mistakenly took 2 instead of 1 Tegretol. He denies recent nausea, vomiting, cough, shortness of breath, or chest pain. He was out in the hot sun today; there are no other modifying factors.  Patient is a 52 y.o. male presenting with altered mental status. The history is provided by the patient.  Altered Mental Status    Past Medical History  Diagnosis Date  . Thrombocytopenia   . Cirrhosis   . Hepatitis C   . Hypertension   . Anxiety   . Anemia   . Blood transfusion   . Obesity   . Pancreatitis   . Bipolar disorder, unspecified 09/27/2012  . Depression 09/27/2012    Past Surgical History  Procedure Laterality Date  . Tonsillectomy      Family History  Problem Relation Age of Onset  . Cancer - Other Mother     Uterine  . Coronary artery disease Father   . Coronary artery disease Paternal Grandfather   . Hypertension Father   . Hypertension Paternal Grandfather     History  Substance Use Topics  . Smoking status: Former Smoker    Types: Cigarettes    Quit date: 11/05/2003  . Smokeless tobacco: Not on file  . 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 states last etoh was 2011      Review of Systems  Psychiatric/Behavioral: Positive for altered mental status.  All other systems reviewed and are negative.    Allergies  Review of patient's allergies indicates no known allergies.  Home Medications   No current outpatient prescriptions on file.  BP 121/80  Pulse 99  Temp(Src) 99.7 F (37.6 C) (Rectal)  Resp 18  SpO2 95%  Physical Exam  Nursing note and vitals reviewed. Constitutional: He is oriented to person, place, and time. He appears well-developed and well-nourished.  He is disheveled.  HENT:  Head: Normocephalic.  Right Ear: External ear normal.  Left Ear: External ear normal.  Abrasion left forehead, not bleeding. Mucous membranes are dry. No oral or dental injuries.   Eyes: Conjunctivae and EOM are normal. Pupils are equal, round, and reactive to light.  Neck: Normal range of motion and phonation normal. Neck supple.  Cardiovascular: Normal rate, regular rhythm, normal heart sounds and intact distal pulses.   Pulmonary/Chest: Effort normal and breath sounds normal. He exhibits no bony tenderness.  Abdominal: Soft. Normal appearance. There is no tenderness.  Musculoskeletal: Normal range of motion.  No tenderness to palpation of the cervical, thoracic or lumbar spine. Normal range of motion, neck and back. No large joint deformity  of arms, and legs.  Neurological: He is alert and oriented to person, place, and time. He has normal strength. No cranial nerve deficit or sensory deficit. He exhibits normal muscle tone. Coordination normal.  Skin: Skin is warm, dry and intact.  Psychiatric: He has a normal mood and affect. His behavior is normal. Judgment and thought content normal.    ED Course  Procedures (including critical care time)   Medications  amantadine (SYMMETREL) capsule 100 mg (not administered)  FLUoxetine (PROZAC) capsule 20  mg (not administered)  OXcarbazepine (TRILEPTAL) tablet 150 mg (not administered)  benztropine (COGENTIN) tablet 1 mg (not administered)  haloperidol (HALDOL) tablet 5 mg (not administered)  hydrOXYzine (ATARAX/VISTARIL) tablet 50 mg (not administered)  lactulose (CHRONULAC) 10 GM/15ML solution 30 g (not administered)  traZODone (DESYREL) tablet 100 mg (not administered)  sodium chloride 0.9 % injection 3 mL (not administered)  dextrose 5 % solution (not administered)  acetaminophen (TYLENOL) tablet 325 mg (not administered)    Or  acetaminophen (TYLENOL) suppository 650 mg (not administered)  polyethylene glycol (MIRALAX / GLYCOLAX) packet 17 g (not administered)  bisacodyl (DULCOLAX) suppository 10 mg (not administered)  magnesium citrate solution 1 Bottle (not administered)  ondansetron (ZOFRAN) tablet 4 mg (not administered)    Or  ondansetron (ZOFRAN) injection 4 mg (not administered)  alum & mag hydroxide-simeth (MAALOX/MYLANTA) 200-200-20 MG/5ML suspension 30 mL (not administered)  folic acid (FOLVITE) tablet 1 mg (not administered)  thiamine (VITAMIN B-1) tablet 100 mg (not administered)  multivitamin with minerals tablet 1 tablet (not administered)  LORazepam (ATIVAN) tablet 1 mg (not administered)    Or  LORazepam (ATIVAN) injection 1 mg (not administered)  thiamine (VITAMIN B-1) tablet 100 mg (not administered)    Or  thiamine (B-1) injection 100 mg (not administered)  folic acid (FOLVITE) tablet 1 mg (not administered)  sodium chloride 0.9 % bolus 1,000 mL (not administered)  0.9 %  sodium chloride infusion (0 mLs Intravenous Stopped 09/27/12 1410)    Followed by  0.9 %  sodium chloride infusion (0 mLs Intravenous Stopped 09/27/12 1410)  acetaminophen (TYLENOL) tablet 650 mg (650 mg Oral Given 09/27/12 1240)  aspirin chewable tablet 324 mg (324 mg Oral Given 09/27/12 1447)  cefTRIAXone (ROCEPHIN) 1 g in dextrose 5 % 50 mL IVPB (0 g Intravenous Stopped 09/27/12 1550)     Cooling Blanket applied  Patient Vitals for the past 24 hrs:  BP Temp Temp src Pulse Resp SpO2  09/27/12 1629 - - - - - 95 %  09/27/12 1545 121/80 mmHg 99.7 F (37.6 C) - 99 18 95 %  09/27/12 1530 122/89 mmHg 99.9 F (37.7 C) - 101 12 96 %  09/27/12 1515 120/78 mmHg 99.9 F (37.7 C) - 100 22 96 %  09/27/12 1500 132/91 mmHg 100 F (37.8 C) - 102 18 93 %  09/27/12 1445 133/92 mmHg 100 F (37.8 C) - 100 14 93 %  09/27/12 1430 123/91 mmHg 99.9 F (37.7 C) - 99 17 93 %  09/27/12 1415 128/82 mmHg 100 F (37.8 C) - 99 25 96 %  09/27/12 1400 150/93 mmHg 100 F (37.8 C) - 101 25 94 %  09/27/12 1359 150/93 mmHg 100 F (37.8 C) Temporal 101 24 95 %  09/27/12 1345 146/129 mmHg 100.4 F (38 C) - 104 25 94 %  09/27/12 1330 141/85 mmHg 100.9 F (38.3 C) - 103 29 95 %  09/27/12 1315 159/74 mmHg 101.1 F (38.4 C) - 106 30 96 %  09/27/12 1300 127/73 mmHg 100.9 F (38.3 C) - 110 23 96 %  09/27/12 1245 149/86 mmHg 97.7 F (36.5 C) - 107 21 95 %  09/27/12 1230 136/83 mmHg - - 111 28 94 %  09/27/12 1215 158/105 mmHg - - 112 28 95 %  09/27/12 1204 - 102.2 F (39 C) Rectal - - -  09/27/12 1200 149/91 mmHg - - 114 34 93 %  09/27/12 1145 136/73 mmHg - - 115 - 94 %  09/27/12 1141 - - - - - 92 %  09/27/12 1136 - - - - - 92 %  09/27/12 1133 148/84 mmHg 98.2 F (36.8 C) Oral 114 22 88 %    2:18 PM Reevaluation with update and discussion. After initial assessment and treatment, an updated evaluation reveals he remains alert and oriented x3. He denies chest pain. He is feeling somewhat better. Initial labs are back, while not reported in the computer, or evidence for acute renal failure, mild, troponin leak, hypernatremia, hyperchloremia. By report of his nurse, he has produced about 250 cc of cloudy urine. The urinalysis is, unfortunately still pending. His temperature, is now near normal, at 100; with very good progress from the heat stroke level; i.e., near normalization in less than 2 hours  Majorie Santee L        Date: 09/27/12  Rate: 109  Rhythm: sinus tachycardia  QRS Axis: normal  PR and QT Intervals: normal  ST/T Wave abnormalities: nonspecific T wave changes  PR and QRS Conduction Disutrbances:none  Narrative Interpretation:   Old EKG Reviewed: unchanged- 09/26/12   Nursing Notes Reviewed/ Care Coordinated Applicable Imaging Reviewed Interpretation of Laboratory Data incorporated into ED treatment    2:29 PM-Consult complete with [facility]. Patient case explained and discussed. Dr Janee Morn agrees to admit patient for further evaluation and treatment. Call ended at 14:55  2:30 PM-Consult complete with Dr. Anne Fu, Cardiology. Patient case explained and discussed. He agrees to see the patient for further evaluation and treatment. Call ended at 16:50  CRITICAL CARE Performed by: Flint Melter Total critical care time: 75 minutes Critical care time was exclusive of separately billable procedures and treating other patients. Critical care was necessary to treat or prevent imminent or life-threatening deterioration. Critical care was time spent personally by me on the following activities: development of treatment plan with patient and/or surrogate as well as nursing, discussions with consultants, evaluation of patient's response to treatment, examination of patient, obtaining history from patient or surrogate, ordering and performing treatments and interventions, ordering and review of laboratory studies, ordering and review of radiographic studies, pulse oximetry and re-evaluation of patient's condition.  Labs Reviewed  URINALYSIS, ROUTINE W REFLEX MICROSCOPIC - Abnormal; Notable for the following:    Color, Urine AMBER (*)    APPearance HAZY (*)    Hgb urine dipstick LARGE (*)    Bilirubin Urine SMALL (*)    Ketones, ur >80 (*)    Protein, ur 100 (*)    Leukocytes, UA TRACE (*)    All other components within normal limits  CBC - Abnormal; Notable for  the following:    WBC 13.0 (*)    RDW 16.6 (*)    Platelets 99 (*)    All other components within normal limits  HEPATIC FUNCTION PANEL - Abnormal; Notable for the following:    AST 526 (*)    ALT 123 (*)    Total Bilirubin 2.1 (*)    Bilirubin, Direct 0.7 (*)    Indirect Bilirubin  1.4 (*)    All other components within normal limits  URINE RAPID DRUG SCREEN (HOSP PERFORMED) - Abnormal; Notable for the following:    Benzodiazepines POSITIVE (*)    All other components within normal limits  URINALYSIS, ROUTINE W REFLEX MICROSCOPIC - Abnormal; Notable for the following:    Color, Urine ORANGE (*)    APPearance HAZY (*)    Specific Gravity, Urine 1.031 (*)    Hgb urine dipstick LARGE (*)    Bilirubin Urine SMALL (*)    Ketones, ur 40 (*)    Protein, ur 100 (*)    Leukocytes, UA TRACE (*)    All other components within normal limits  URINE MICROSCOPIC-ADD ON - Abnormal; Notable for the following:    Bacteria, UA FEW (*)    All other components within normal limits  URINE MICROSCOPIC-ADD ON - Abnormal; Notable for the following:    Bacteria, UA MANY (*)    Casts GRANULAR CAST (*)    All other components within normal limits  POCT I-STAT TROPONIN I - Abnormal; Notable for the following:    Troponin i, poc 0.43 (*)    All other components within normal limits  CG4 I-STAT (LACTIC ACID) - Abnormal; Notable for the following:    Lactic Acid, Venous 3.68 (*)    All other components within normal limits  POCT I-STAT, CHEM 8 - Abnormal; Notable for the following:    Sodium 151 (*)    Chloride 116 (*)    Glucose, Bld 125 (*)    All other components within normal limits  URINE CULTURE  MRSA PCR SCREENING  MAGNESIUM  COMPREHENSIVE METABOLIC PANEL  TSH  TROPONIN I  TROPONIN I  TROPONIN I  AMMONIA   Dg Chest 2 View  09/26/2012  *RADIOLOGY REPORT*  Clinical Data:  Larey Seat striking head, slurred speech, history hypertension, cirrhosis  CHEST - 2 VIEW  Comparison: 01/20/2010  Findings:  Normal heart size, mediastinal contours, and pulmonary vascularity. Minimal elongation of thoracic aorta noted. Question focus of vague right upper lobe infiltrate. Minimal peribronchial thickening. Remaining lungs clear. No pleural effusion or pneumothorax.  IMPRESSION: Minimal bronchitic changes with questionable right upper lobe infiltrate.   Original Report Authenticated By: Ulyses Southward, M.D.    Ct Head Wo Contrast  09/26/2012  *RADIOLOGY REPORT*  Clinical Data: 52 year old male with altered mental status.  CT HEAD WITHOUT CONTRAST  Technique:  Contiguous axial images were obtained from the base of the skull through the vertex without contrast.  Comparison: 04/01/2012 and earlier.  Findings: Mild right maxillary sinus mucosal thickening.  Other Visualized paranasal sinuses and mastoids are clear.  Visualized orbits and scalp soft tissues are within normal limits.  No acute osseous abnormality identified.  Scaphocephaly again noted.   Stable and normal cerebral volume.  No ventriculomegaly. No midline shift, mass effect, or evidence of mass lesion.  No acute intracranial hemorrhage identified.  No suspicious intracranial vascular hyperdensity. No evidence of cortically based acute infarction identified.  IMPRESSION: Stable and normal for age noncontrast CT appearance of the brain.   Original Report Authenticated By: Erskine Speed, M.D.    Dg Chest Portable 1 View  09/27/2012  *RADIOLOGY REPORT*  Clinical Data: Altered mental status.  PORTABLE CHEST - 1 VIEW  Comparison: PA and lateral chest 09/26/2012.  Findings: Lungs are clear.  Heart size normal.  No pneumothorax or pleural effusion.  IMPRESSION: Negative chest.   Original Report Authenticated By: Holley Dexter, M.D.  1. Acute renal failure   2. Dehydration   3. MI, acute, non ST segment elevation   4. UTI (lower urinary tract infection)       MDM  Evaluation, consistent with a heat stroke with multiple endorgan injuries; consistent with  low perfusion state. Altered mental status has improved with treatment. Doubt CVA. Laboratory evaluation is markedly worse than yesterday, when he was screened for altered mental status. Altered mental status yesterday looked more like oversedation. Patient will require admission with close observation in a step down unit. I do not think that this represents an ACS, in the typical sense of coronary obstruction. It more likely represents, troponin leak, from a low perfusion state.    Nursing Notes Reviewed/ Care Coordinated, and agree without changes. Applicable Imaging Reviewed. Radiologic imaging report reviewed and images by radiography  - viewed, by me. Interpretation of Laboratory Data incorporated into ED treatment    Plan: Admit    Flint Melter, MD 09/27/12 [phone]

## 2012-09-27 NOTE — ED Notes (Signed)
Results of i-stat given to Dr. Effie Shy.

## 2012-09-27 NOTE — Progress Notes (Signed)
Lab called a critical troponin level of 0.73. Called and spoke with Dr. Mayford Knife in regards to lab value. No new orders given at this time. Will continue to monitor.
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
## 2012-09-27 NOTE — ED Notes (Signed)
Pt has visible bruising to chest.

## 2012-09-27 NOTE — ED Notes (Signed)
Haston Casebolt (wife) [phone]

## 2012-09-27 NOTE — Progress Notes (Signed)
ANTIBIOTIC CONSULT NOTE - INITIAL  Pharmacy Consult for Vancomycin and Zosyn Indication:  Altered mental status and Fever  No Known Allergies  Patient Measurements: Body Weight: 130.6 kg Body Height:  189 cm  Vital Signs: Temp: 99.7 F (37.6 C) (05/13 1545) Temp src: Temporal (05/13 1359) BP: 121/80 mmHg (05/13 1545) Pulse Rate: 99 (05/13 1545)  Labs:  Recent Labs  09/26/12 0830 09/27/12 1159 09/27/12 1232  WBC 6.5 13.0*  --   HGB 13.3 15.3 15.3  PLT 77* 99*  --   CREATININE 0.78  --  0.80   Medical History: Past Medical History  Diagnosis Date  . Thrombocytopenia   . Cirrhosis   . Hepatitis C   . Hypertension   . Anxiety   . Anemia   . Blood transfusion   . Obesity   . Pancreatitis   . Bipolar disorder, unspecified 09/27/2012  . Depression 09/27/2012   Medications:  Anti-infectives   Start     Dose/Rate Route Frequency Ordered Stop   09/27/12 1430  cefTRIAXone (ROCEPHIN) 1 g in dextrose 5 % 50 mL IVPB     1 g 100 mL/hr over 30 Minutes Intravenous  Once 09/27/12 1424 09/27/12 1550     Assessment: 52 yo male presents to the ED after a fall to the ground today with an altered mental status.  He sustained a facial injury in the fall, was lethargic on admit but is now alert and conversant.  He has some mild leukocytosis with WBC of 13.  A urine sample was obtained which was cloudy suggestive of a UTI.  His Creatinine is normal, however, he is dehydrated with some renal failure noted.  He is obese, and we will utilize an extended interval dosing regimen to ensure adequate clearance.  He has been given IV Ceftriaxone 1 gm in the ED and is to be started on broad spectrum antibiotics until infection etiology is determined.  Goal of Therapy:  Vancomycin trough level 15-20 mcg/ml  Plan:  1.  Begin IV Zosyn 3.375 gm every 8 hours. 2.  Begin IV Vancomycin 1500 mg IV every 24 hours. 3.  Monitor steady state levels if Vancomycin is to be continued > 72 hours.  Nadara Mustard, PharmD., MS Clinical Pharmacist Pager:  [phone] Thank you for allowing pharmacy to be part of this patients care team. 09/27/2012,4:49 PM
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
## 2012-09-28 DIAGNOSIS — F1994 Other psychoactive substance use, unspecified with psychoactive substance-induced mood disorder: Secondary | ICD-10-CM

## 2012-09-28 DIAGNOSIS — F1120 Opioid dependence, uncomplicated: Secondary | ICD-10-CM

## 2012-09-28 LAB — CBC WITH DIFFERENTIAL/PLATELET
Basophils Relative: 0 % (ref 0–1)
Eosinophils Absolute: 0.2 10*3/uL (ref 0.0–0.7)
Eosinophils Relative: 3 % (ref 0–5)
HCT: 37.2 % — ABNORMAL LOW (ref 39.0–52.0)
Hemoglobin: 12.7 g/dL — ABNORMAL LOW (ref 13.0–17.0)
MCH: 28 pg (ref 26.0–34.0)
MCHC: 34.1 g/dL (ref 30.0–36.0)
MCV: 82.1 fL (ref 78.0–100.0)
Monocytes Absolute: 0.7 10*3/uL (ref 0.1–1.0)
Monocytes Relative: 12 % (ref 3–12)

## 2012-09-28 LAB — COMPREHENSIVE METABOLIC PANEL
Albumin: 2.6 g/dL — ABNORMAL LOW (ref 3.5–5.2)
BUN: 15 mg/dL (ref 6–23)
Calcium: 8.3 mg/dL — ABNORMAL LOW (ref 8.4–10.5)
Chloride: 109 mEq/L (ref 96–112)
Creatinine, Ser: 0.71 mg/dL (ref 0.50–1.35)
Total Bilirubin: 1.3 mg/dL — ABNORMAL HIGH (ref 0.3–1.2)
Total Protein: 5.8 g/dL — ABNORMAL LOW (ref 6.0–8.3)

## 2012-09-28 LAB — LACTIC ACID, PLASMA: Lactic Acid, Venous: 0.9 mmol/L (ref 0.5–2.2)

## 2012-09-28 LAB — TROPONIN I: Troponin I: 0.55 ng/mL (ref <0.30)

## 2012-09-28 LAB — URINE CULTURE: Colony Count: NO GROWTH

## 2012-09-28 MED ORDER — CEFTRIAXONE SODIUM 1 G IJ SOLR
1.0000 g | Freq: Once | INTRAMUSCULAR | Status: AC
  Administered 2012-09-28: 1 g via INTRAVENOUS
  Filled 2012-09-28: qty 10

## 2012-09-28 MED ORDER — DEXTROSE 5 % IV SOLN
1.0000 g | INTRAVENOUS | Status: DC
  Administered 2012-09-29: 1 g via INTRAVENOUS
  Filled 2012-09-28: qty 10

## 2012-09-28 MED ORDER — POTASSIUM CHLORIDE CRYS ER 20 MEQ PO TBCR
20.0000 meq | EXTENDED_RELEASE_TABLET | Freq: Once | ORAL | Status: AC
  Administered 2012-09-28: 20 meq via ORAL
  Filled 2012-09-28: qty 1

## 2012-09-28 MED ORDER — DEXTROSE 5 % IV SOLN
1.0000 g | INTRAVENOUS | Status: DC
  Filled 2012-09-28: qty 10
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
## 2012-09-28 NOTE — Progress Notes (Signed)
Subjective: Pt more awake- ALert No c/o   Objective: Vital signs in last 24 hours: Temp:  [97.5 F (36.4 C)-102.2 F (39 C)] 97.8 F (36.6 C) (05/14 0707) Pulse Rate:  [87-115] 87 (05/14 0330) Resp:  [12-34] 23 (05/14 0330) BP: (120-159)/(73-129) 131/82 mmHg (05/14 0330) SpO2:  [88 %-98 %] 98 % (05/14 0330) FiO2 (%):  [32 %] 32 % (05/13 1629) Weight:  [132.2 kg (291 lb 7.2 oz)-135.8 kg (299 lb 6.2 oz)] 135.8 kg (299 lb 6.2 oz) (05/14 0500) Weight change:     Intake/Output from previous day: 05/13 0701 - 05/14 0700 In: -  Out: 1250 [Urine:1250] Intake/Output this shift: Total I/O In: -  Out: 225 [Urine:225]  Head: Normocephalic, without obvious abnormality Resp: clear to auscultation bilaterally Cardio: regular rate and rhythm GI: soft, non-tender; bowel sounds normal; no masses,  no organomegaly  Lab Results:  Recent Labs  09/27/12 1159 09/27/12 1232 09/28/12 0500  WBC 13.0*  --  5.9  HGB 15.3 15.3 12.7*  HCT 43.2 45.0 37.2*  PLT 99*  --  69*   BMET  Recent Labs  09/27/12 1717 09/28/12 0500  NA 147* 143  K 3.3* 3.1*  CL 112 109  CO2 23 25  GLUCOSE 148* 126*  BUN 21 15  CREATININE 0.78 0.71  CALCIUM 8.7 8.3*    Studies/Results: Dg Chest 2 View  09/26/2012   *RADIOLOGY REPORT*  Clinical Data:  Larey Seat striking head, slurred speech, history hypertension, cirrhosis  CHEST - 2 VIEW  Comparison: 01/20/2010  Findings: Normal heart size, mediastinal contours, and pulmonary vascularity. Minimal elongation of thoracic aorta noted. Question focus of vague right upper lobe infiltrate. Minimal peribronchial thickening. Remaining lungs clear. No pleural effusion or pneumothorax.  IMPRESSION: Minimal bronchitic changes with questionable right upper lobe infiltrate.   Original Report Authenticated By: Ulyses Southward, M.D.   Ct Head Wo Contrast  09/26/2012   *RADIOLOGY REPORT*  Clinical Data: 52 year old male with altered mental status.  CT HEAD WITHOUT CONTRAST  Technique:   Contiguous axial images were obtained from the base of the skull through the vertex without contrast.  Comparison: 04/01/2012 and earlier.  Findings: Mild right maxillary sinus mucosal thickening.  Other Visualized paranasal sinuses and mastoids are clear.  Visualized orbits and scalp soft tissues are within normal limits.  No acute osseous abnormality identified.  Scaphocephaly again noted.   Stable and normal cerebral volume.  No ventriculomegaly. No midline shift, mass effect, or evidence of mass lesion.  No acute intracranial hemorrhage identified.  No suspicious intracranial vascular hyperdensity. No evidence of cortically based acute infarction identified.  IMPRESSION: Stable and normal for age noncontrast CT appearance of the brain.   Original Report Authenticated By: Erskine Speed, M.D.   Dg Chest Portable 1 View  09/27/2012   *RADIOLOGY REPORT*  Clinical Data: Altered mental status.  PORTABLE CHEST - 1 VIEW  Comparison: PA and lateral chest 09/26/2012.  Findings: Lungs are clear.  Heart size normal.  No pneumothorax or pleural effusion.  IMPRESSION: Negative chest.   Original Report Authenticated By: Holley Dexter, M.D.   Dg Abd Portable 1v  09/27/2012   *RADIOLOGY REPORT*  Clinical Data: Abdominal pain, post fall.  PORTABLE ABDOMEN - 1 VIEW  Comparison: CT 01/23/2010  Findings: Gas throughout nondistended large and small bowel.  No free air, organomegaly or suspicious calcification.  No acute bony abnormality.  IMPRESSION: No acute findings.   Original Report Authenticated By: Charlett Nose, M.D.    Medications: I have  reviewed the patient's current medications.  Assessment/Plan: Altered Mental status/ encephalopathy- multiple factor- CT head negative, dehydration, heat stroke? UTI- electrolyte imbalance- improving- continue IVF Metabolic acidosis- no evidence of sepsis- WBC norma- d/c Vanc and Zosyn Elevated Troponin- cardiology consult noted- Echo ; stress related- avoid aspirin due to low  platelet and bleeding risk - BP ok Bipolar disoder/ major Depression - out side psychiatry Dr Evelene Croon; will have psychiatry consult in house- med check- QT prolong-   Substance abuse- [facility] in [facility]- prev ETOH- not any for last 2 month Hep C- stable Cirrhosis: mild elevated Ammonia- continue lactulose platelet  count low stable. UTI? Culture pending- keep Rocephin IV Hypernatremia- improved Low K- replace Social consult: for return to [facility] PT consult   LOS: 1 day   [name] [name] 09/28/2012, 7:32 AM

## 2012-09-28 NOTE — Evaluation (Signed)
Occupational Therapy Evaluation Patient Details Name: [name] [name] MRN: [id number] DOB: 11-05-1960 Today's Date: 09/28/2012 Time: 4098-1191 OT Time Calculation (min): 24 min  OT Assessment / Plan / Recommendation Clinical Impression  Pt found down on ground after spending many hours trying to get up from fall and admitted with altered mental status, extreme fatigue, weakness, fever, hypernatremia, lactic acidosis, elevated liver enzymes and mildly positive troponin, point-of-care, 0.43. History of anxiety, depression, bipolar disorder and polysubstance abuse.    Pt reports he lives in "[facility]" and would like to return to this living situation.  Will continue to follow acutely to address below problem list. No OT f/u needed after d/c.    OT Assessment  Patient needs continued OT Services    Follow Up Recommendations  No OT follow up    Barriers to Discharge      Equipment Recommendations  None recommended by OT    Recommendations for Other Services    Frequency  Min 2X/week    Precautions / Restrictions Precautions Precautions: None Restrictions Weight Bearing Restrictions: No   Pertinent Vitals/Pain See vitals    ADL  Grooming: Performed;Wash/dry face;Supervision/safety Where Assessed - Grooming: Unsupported standing Upper Body Bathing: Simulated;Set up Where Assessed - Upper Body Bathing: Unsupported sitting Lower Body Bathing: Simulated;Min guard Where Assessed - Lower Body Bathing: Unsupported sit to stand Upper Body Dressing: Performed;Set up Where Assessed - Upper Body Dressing: Unsupported sitting Lower Body Dressing: Simulated;Minimal assistance Where Assessed - Lower Body Dressing: Unsupported sit to stand Toilet Transfer: Simulated;Min guard Toilet Transfer Method: Sit to Barista:  (bed to ambulate in hallway, then return to chair in room) Equipment Used: Gait belt Transfers/Ambulation Related to ADLs: Min guard  with pt pushing IV pole. Slow gait but pt reports he moves slowly at baseline. ADL Comments: Pt requiring increased time for tasks but overall moving well.     OT Diagnosis: Generalized weakness;Acute pain  OT Problem List: Decreased strength;Decreased activity tolerance;Decreased knowledge of use of DME or AE;Pain OT Treatment Interventions: Self-care/ADL training;DME and/or AE instruction;Therapeutic activities;Patient/family education   OT Goals Acute Rehab OT Goals OT Goal Formulation: With patient Time For Goal Achievement: 10/05/12 Potential to Achieve Goals: Good ADL Goals Pt Will Perform Grooming: with modified independence;Standing at sink ADL Goal: Grooming - Progress: Goal set today Pt Will Perform Lower Body Dressing: with modified independence;Sit to stand from chair;Sit to stand from bed ADL Goal: Lower Body Dressing - Progress: Goal set today Pt Will Transfer to Toilet: with modified independence;Ambulation;Regular height toilet ADL Goal: Toilet Transfer - Progress: Goal set today Pt Will Perform Toileting - Clothing Manipulation: with modified independence;Sitting on 3-in-1 or toilet;Standing ADL Goal: Toileting - Clothing Manipulation - Progress: Goal set today Miscellaneous OT Goals Miscellaneous OT Goal #1: Pt will retrieve ADL items at mod I level. OT Goal: Miscellaneous Goal #1 - Progress: Goal set today  Visit Information  Last OT Received On: 09/28/12 Assistance Needed: +1 PT/OT Co-Evaluation/Treatment: Yes    Subjective Data      Prior Functioning     Home Living Lives With: Other (Comment) (with room mate in a half-way home) Available Help at Discharge: Friend(s);Available PRN/intermittently Type of Home: Other (Comment) Home Access: Stairs to enter Entrance Stairs-Number of Steps: 3 Home Layout: Two level Alternate Level Stairs-Number of Steps: 14 Bathroom Shower/Tub: Engineer, manufacturing systems: Standard Home Adaptive Equipment:  None Additional Comments: lives at [facility]" Prior Function Level of Independence: Independent  Able to Take Stairs?: Yes Driving: No Comments: Walks to grocery store, otherwise takes bus. Communication Communication: No difficulties         Vision/Perception     Cognition  Cognition Arousal/Alertness: Awake/alert Behavior During Therapy: WFL for tasks assessed/performed Overall Cognitive Status: Within Functional Limits for tasks assessed    Extremity/Trunk Assessment Right Upper Extremity Assessment RUE ROM/Strength/Tone: WFL for tasks assessed Left Upper Extremity Assessment LUE ROM/Strength/Tone: WFL for tasks assessed Right Lower Extremity Assessment RLE ROM/Strength/Tone: Within functional levels Left Lower Extremity Assessment LLE ROM/Strength/Tone: Within functional levels Trunk Assessment Trunk Assessment: Normal     Mobility Bed Mobility Bed Mobility: Rolling Right;Right Sidelying to Sit;Sitting - Scoot to Edge of Bed Rolling Right: 5: Supervision;With rail Right Sidelying to Sit: 5: Supervision;With rails Sitting - Scoot to Edge of Bed: 5: Supervision Details for Bed Mobility Assistance: safe technique Transfers Transfers: Stand to Sit;Sit to Stand Sit to Stand: 4: Min guard;From bed;With upper extremity assist Stand to Sit: 4: Min guard;To chair/3-in-1;With upper extremity assist Details for Transfer Assistance: increased time but safe technique     Exercise     Balance Balance Balance Assessed: No   End of Session OT - End of Session Equipment Utilized During Treatment: Gait belt Activity Tolerance: Patient tolerated treatment well Patient left: in chair;with call bell/phone within reach Nurse Communication: Mobility status  GO   09/28/2012 Cipriano Mile OTR/L Pager [phone] Office [phone]   Cipriano Mile 09/28/2012, 1:16 PM

## 2012-09-28 NOTE — Progress Notes (Signed)
Subjective:  More alert, feeling better. No chest pain.  Objective:  Vital Signs in the last 24 hours: Temp:  [97.5 F (36.4 C)-102.2 F (39 C)] 97.8 F (36.6 C) (05/14 0749) Pulse Rate:  [87-115] 87 (05/14 0749) Resp:  [12-34] 19 (05/14 0749) BP: (120-159)/(73-129) 142/77 mmHg (05/14 0749) SpO2:  [88 %-98 %] 96 % (05/14 0749) FiO2 (%):  [32 %] 32 % (05/13 1629) Weight:  [132.2 kg (291 lb 7.2 oz)-135.8 kg (299 lb 6.2 oz)] 135.8 kg (299 lb 6.2 oz) (05/14 0500)  Intake/Output from previous day: 05/13 0701 - 05/14 0700 In: -  Out: 1250 [Urine:1250]   Physical Exam: General: Well developed, clearer thought process. Head: Excoriations on head  Lungs: Clear to auscultation and percussion. Heart: Normal S1 and S2.  No murmur, rubs or gallops.  Abdomen: soft, non-tender, positive bowel sounds. Extremities: No clubbing or cyanosis. No edema. Neurologic: Alert and oriented x 3.    Lab Results:  Recent Labs  09/27/12 1159 09/27/12 1232 09/28/12 0500  WBC 13.0*  --  5.9  HGB 15.3 15.3 12.7*  PLT 99*  --  69*    Recent Labs  09/27/12 1717 09/28/12 0500  NA 147* 143  K 3.3* 3.1*  CL 112 109  CO2 23 25  GLUCOSE 148* 126*  BUN 21 15  CREATININE 0.78 0.71    Recent Labs  09/27/12 2217 09/28/12 0430  TROPONINI 0.58* 0.55*   Hepatic Function Panel  Recent Labs  09/27/12 1159  09/28/12 0500  PROT 7.4  < > 5.8*  ALBUMIN 3.7  < > 2.6*  AST 526*  < > 369*  ALT 123*  < > 114*  ALKPHOS 63  < > 46  BILITOT 2.1*  < > 1.3*  BILIDIR 0.7*  --   --   IBILI 1.4*  --   --   < > = values in this interval not displayed. No results found for this basename: CHOL,  in the last 72 hours No results found for this basename: PROTIME,  in the last 72 hours  Imaging: Dg Chest 2 View  09/26/2012   *RADIOLOGY REPORT*  Clinical Data:  Larey Seat striking head, slurred speech, history hypertension, cirrhosis  CHEST - 2 VIEW  Comparison: 01/20/2010  Findings: Normal heart size,  mediastinal contours, and pulmonary vascularity. Minimal elongation of thoracic aorta noted. Question focus of vague right upper lobe infiltrate. Minimal peribronchial thickening. Remaining lungs clear. No pleural effusion or pneumothorax.  IMPRESSION: Minimal bronchitic changes with questionable right upper lobe infiltrate.   Original Report Authenticated By: Ulyses Southward, M.D.   Ct Head Wo Contrast  09/26/2012   *RADIOLOGY REPORT*  Clinical Data: 52 year old male with altered mental status.  CT HEAD WITHOUT CONTRAST  Technique:  Contiguous axial images were obtained from the base of the skull through the vertex without contrast.  Comparison: 04/01/2012 and earlier.  Findings: Mild right maxillary sinus mucosal thickening.  Other Visualized paranasal sinuses and mastoids are clear.  Visualized orbits and scalp soft tissues are within normal limits.  No acute osseous abnormality identified.  Scaphocephaly again noted.   Stable and normal cerebral volume.  No ventriculomegaly. No midline shift, mass effect, or evidence of mass lesion.  No acute intracranial hemorrhage identified.  No suspicious intracranial vascular hyperdensity. No evidence of cortically based acute infarction identified.  IMPRESSION: Stable and normal for age noncontrast CT appearance of the brain.   Original Report Authenticated By: Erskine Speed, M.D.   Dg Chest Portable  1 View  09/27/2012   *RADIOLOGY REPORT*  Clinical Data: Altered mental status.  PORTABLE CHEST - 1 VIEW  Comparison: PA and lateral chest 09/26/2012.  Findings: Lungs are clear.  Heart size normal.  No pneumothorax or pleural effusion.  IMPRESSION: Negative chest.   Original Report Authenticated By: Holley Dexter, M.D.   Dg Abd Portable 1v  09/27/2012   *RADIOLOGY REPORT*  Clinical Data: Abdominal pain, post fall.  PORTABLE ABDOMEN - 1 VIEW  Comparison: CT 01/23/2010  Findings: Gas throughout nondistended large and small bowel.  No free air, organomegaly or suspicious  calcification.  No acute bony abnormality.  IMPRESSION: No acute findings.   Original Report Authenticated By: Charlett Nose, M.D.   Personally viewed.   Telemetry: No adverse arrhythmias Personally viewed.   EKG:  QTC 541 ms, prolonged period improved from yesterday's.  Cardiac Studies:  Echocardiogram pending.  Assessment/Plan:  Principal Problem:   Sepsis Active Problems:   HEPATITIS C   ANEMIA, SECONDARY TO ACUTE BLOOD LOSS   THROMBOCYTOPENIA   CIRRHOSIS   BACK PAIN, CHRONIC   ALCOHOL ABUSE, HX OF   Hepatic encephalopathy   Major depression   Cocaine abuse   Polysubstance dependence   Hypernatremia   Dehydration   Bipolar disorder, unspecified   Depression   Acute encephalopathy   Cardiac enzymes elevated  52 year old male with cirrhosis, altered mental status, hypernatremia, profound weakness, prolonged QT, hypertension, psychiatric illness, mildly elevated troponin.   -Mildly elevated troponin of is likely secondary to myocardial strain in the setting of acute metabolic illness/underlying medical illness, lactic acidosis. I agree currently with echocardiogram for further evaluation of left ventricular function. At this point, I would not advocate for heparin/ACS dose unless troponin becomes greatly elevated. Aspirin is not unreasonable in the setting as long as it is not felt to be a bleeding risk in the setting of his cirrhosis.   -Prolonged QT interval-concerning length of QT. He is on psychiatric medications as listed above. I would strongly recommend consultation with his psychiatrist and possible adjustment of these medications. Of course, as is metabolic derangement improves, his QT interval may improve as well. Continue to monitor closely. Agree with telemetry.  Will follow.   SKAINS, MARK 09/28/2012, 8:59 AM

## 2012-09-28 NOTE — Progress Notes (Signed)
Physical Therapy Evaluation Patient Details Name: [name] [name] MRN: [id number] DOB: December 05, 1960 Today's Date: 09/28/2012 Time: 4098-1191 PT Time Calculation (min): 24 min  PT Assessment / Plan / Recommendation Clinical Impression  pt found down on the ground after having spent many hour trying to get up.  On arrival, pt had AMS and on eval displays mild weakness, painful abraisions and sunburn.  Pt's mentation is improved, but pt can benerit from PT to maximize function.    PT Assessment  Patient needs continued PT services    Follow Up Recommendations  No PT follow up;Supervision - Intermittent    Does the patient have the potential to tolerate intense rehabilitation      Barriers to Discharge        Equipment Recommendations  None recommended by PT    Recommendations for Other Services     Frequency Min 3X/week    Precautions / Restrictions Precautions Precautions: None Restrictions Weight Bearing Restrictions: No   Pertinent Vitals/Pain       Mobility  Bed Mobility Bed Mobility: Rolling Right;Right Sidelying to Sit;Sitting - Scoot to Edge of Bed Rolling Right: 5: Supervision Right Sidelying to Sit: 5: Supervision;With rails Sitting - Scoot to Edge of Bed: 5: Supervision Details for Bed Mobility Assistance: safe mobility use of the rail Transfers Transfers: Sit to Stand;Stand to Sit Sit to Stand: 4: Min guard;From bed Stand to Sit: 4: Min guard;To chair/3-in-1 Details for Transfer Assistance: safe , but slow mobility due to paing and stiffness Ambulation/Gait Ambulation/Gait Assistance: 5: Supervision Ambulation Distance (Feet): 380 Feet Assistive device: Other (Comment) (pushing iv pole) Ambulation/Gait Assistance Details: generally steady and slower Gait Pattern: Step-through pattern Stairs: No Wheelchair Mobility Wheelchair Mobility: No    Exercises     PT Diagnosis: Generalized weakness;Acute pain  PT Problem List: Decreased strength;Decreased  activity tolerance;Decreased mobility;Pain PT Treatment Interventions:     PT Goals Acute Rehab PT Goals PT Goal Formulation: With patient Time For Goal Achievement: 10/05/12 Potential to Achieve Goals: Good Pt will go Sit to Stand: Independently PT Goal: Sit to Stand - Progress: Goal set today Pt will Transfer Bed to Chair/Chair to Bed: Independently PT Transfer Goal: Bed to Chair/Chair to Bed - Progress: Met Pt will Ambulate: >150 feet;with supervision PT Goal: Ambulate - Progress: Goal set today Pt will Go Up / Down Stairs: 3-5 stairs;with modified independence;with rail(s) PT Goal: Up/Down Stairs - Progress: Goal set today  Visit Information  Last PT Received On: 09/28/12 Assistance Needed: +1    Subjective Data  Subjective: I fell and I couldn't get up, I guess it was the Tegretol Patient Stated Goal: I [facility] to be able [facility] It's a good place for me.   Prior Functioning  Home Living Lives With: Other (Comment) (with room mate in a half-way home) Available Help at Discharge: Friend(s);Available PRN/intermittently Type of Home: Other (Comment) Home Access: Stairs to enter Entrance Stairs-Number of Steps: 3 Home Layout: Two level Alternate Level Stairs-Number of Steps: 14 Bathroom Shower/Tub: Engineer, manufacturing systems: Standard Home Adaptive Equipment: None Additional Comments: lives at [facility]" Prior Function Level of Independence: Independent Able to Take Stairs?: Yes Driving: No Comments: Walks to grocery store, otherwise takes bus. Communication Communication: No difficulties    Cognition  Cognition Arousal/Alertness: Awake/alert Overall Cognitive Status: Within Functional Limits for tasks assessed    Extremity/Trunk Assessment Right Lower Extremity Assessment RLE ROM/Strength/Tone: Within functional levels Left Lower Extremity Assessment LLE  ROM/Strength/Tone: Within functional levels Trunk  Assessment Trunk Assessment: Normal   Balance Balance Balance Assessed: No  End of Session PT - End of Session Activity Tolerance: Patient tolerated treatment well Patient left: in chair;with call bell/phone within reach Nurse Communication: Mobility status  GP     [name] [name], [name] [name] 09/28/2012, 12:20 PM 09/28/2012  [name] [name], PT [phone]  (pager)

## 2012-09-28 NOTE — Progress Notes (Signed)
Clinical Social Work Department BRIEF PSYCHOSOCIAL ASSESSMENT 09/28/2012  Patient:  [name] [name],[name] [name]     Account Number:  [id number]     Admit date:  09/27/2012  Clinical Social Worker:  Margaree Mackintosh  Date/Time:  09/28/2012 12:00 M  Referred by:  Care Management  Date Referred:  09/28/2012 Referred for  Other - See comment   Other Referral:   Pt admitted from "[facility].   Interview type:  Patient Other interview type:    PSYCHOSOCIAL DATA Living Status:  OTHER Admitted from facility:   Level of care:   Primary support name:  Andrei Mccook: [phone] Primary support relationship to patient:  SPOUSE Degree of support available:   Unknown.    CURRENT CONCERNS Current Concerns  Post-Acute Placement   Other Concerns:    SOCIAL WORK ASSESSMENT / PLAN Clinical Social Worker received referral indicating pt is from [facility] and has expressed concern regarding his ability to return. CSW spoke with pt who stated he does not have the facility phone number to speak with them to inform staff of his hospitalization. CSW provided facility number.  CSW encouraged pt to alert medical staff should he have difficulties with contacting facility or returning.  Pt to phone facility from room phone.  CSW to sign off, please re consult if needed.   Assessment/plan status:  Information/Referral to [facility] Other assessment/ plan:   Information/referral to community resources:   [facility].    PATIENT'S/FAMILY'S RESPONSE TO PLAN OF CARE: Pt thanked CSW for intervention.

## 2012-09-28 NOTE — Progress Notes (Signed)
Utilization review completed.  

## 2012-09-29 LAB — BASIC METABOLIC PANEL
BUN: 11 mg/dL (ref 6–23)
CO2: 21 mEq/L (ref 19–32)
Creatinine, Ser: 0.68 mg/dL (ref 0.50–1.35)
GFR calc Af Amer: >90 mL/min (ref >90)
GFR calc non Af Amer: >90 mL/min (ref >90)
Sodium: 138 mEq/L (ref 135–145)

## 2012-09-29 LAB — CBC
HCT: 35.9 % — ABNORMAL LOW (ref 39.0–52.0)
Hemoglobin: 12.3 g/dL — ABNORMAL LOW (ref 13.0–17.0)
MCH: 28.1 pg (ref 26.0–34.0)
MCHC: 34.3 g/dL (ref 30.0–36.0)
RDW: 16.3 % — ABNORMAL HIGH (ref 11.5–15.5)

## 2012-09-29 MED ORDER — AMANTADINE HCL 100 MG PO CAPS: 100.0000 mg | ORAL_CAPSULE | Freq: Three times a day (TID) | ORAL | Status: DC

## 2012-09-29 MED ORDER — OXCARBAZEPINE 150 MG PO TABS: 150.0000 mg | ORAL_TABLET | Freq: Two times a day (BID) | ORAL | Status: DC

## 2012-09-29 MED ORDER — POTASSIUM CHLORIDE CRYS ER 20 MEQ PO TBCR: 40.0000 meq | EXTENDED_RELEASE_TABLET | Freq: Two times a day (BID) | ORAL | Status: DC

## 2012-09-29 MED ORDER — HALOPERIDOL 5 MG PO TABS: 5.0000 mg | ORAL_TABLET | Freq: Every day | ORAL | Status: DC

## 2012-09-29 MED ORDER — LISINOPRIL 20 MG PO TABS: 20.0000 mg | ORAL_TABLET | Freq: Every day | ORAL | Status: DC

## 2012-09-29 MED ORDER — POTASSIUM CHLORIDE CRYS ER 20 MEQ PO TBCR
20.0000 meq | EXTENDED_RELEASE_TABLET | Freq: Once | ORAL | Status: AC
  Administered 2012-09-29: 20 meq via ORAL
  Filled 2012-09-29: qty 1

## 2012-09-29 NOTE — Progress Notes (Signed)
Subjective:  Continues to clear mentally. No CP, no SOB  Objective:  Vital Signs in the last 24 hours: Temp:  [97.5 F (36.4 C)-98.8 F (37.1 C)] 97.5 F (36.4 C) (05/15 0800) Pulse Rate:  [86-93] 87 (05/15 0358) Resp:  [17-27] 26 (05/15 0358) BP: (117-138)/(70-85) 117/82 mmHg (05/15 0800) SpO2:  [94 %-100 %] 96 % (05/15 0800)  Intake/Output from previous day: 05/14 0701 - 05/15 0700 In: 2223 [P.O.:1320; I.V.:903] Out: 350 [Urine:350]   Physical Exam: General: Well developed, clearer thought process.  Head: Excoriations on head  Lungs: Clear to auscultation and percussion.  Heart: Normal S1 and S2. No murmur, rubs or gallops.  Abdomen: soft, non-tender, positive bowel sounds.  Extremities: No clubbing or cyanosis. No edema.  Neurologic: Alert and oriented x 3.      Lab Results:  Recent Labs  09/28/12 0500 09/29/12 0446  WBC 5.9 3.7*  HGB 12.7* 12.3*  PLT 69* 71*    Recent Labs  09/28/12 0500 09/29/12 0446  NA 143 138  K 3.1* 3.3*  CL 109 105  CO2 25 21  GLUCOSE 126* 115*  BUN 15 11  CREATININE 0.71 0.68    Recent Labs  09/27/12 2217 09/28/12 0430  TROPONINI 0.58* 0.55*   Hepatic Function Panel  Recent Labs  09/27/12 1159  09/28/12 0500  PROT 7.4  < > 5.8*  ALBUMIN 3.7  < > 2.6*  AST 526*  < > 369*  ALT 123*  < > 114*  ALKPHOS 63  < > 46  BILITOT 2.1*  < > 1.3*  BILIDIR 0.7*  --   --   IBILI 1.4*  --   --   < > = values in this interval not displayed. No results found for this basename: CHOL,  in the last 72 hours No results found for this basename: PROTIME,  in the last 72 hours  Imaging: Dg Chest Portable 1 View  09/27/2012   *RADIOLOGY REPORT*  Clinical Data: Altered mental status.  PORTABLE CHEST - 1 VIEW  Comparison: PA and lateral chest 09/26/2012.  Findings: Lungs are clear.  Heart size normal.  No pneumothorax or pleural effusion.  IMPRESSION: Negative chest.   Original Report Authenticated By: Holley Dexter, M.D.   Dg Abd  Portable 1v  09/27/2012   *RADIOLOGY REPORT*  Clinical Data: Abdominal pain, post fall.  PORTABLE ABDOMEN - 1 VIEW  Comparison: CT 01/23/2010  Findings: Gas throughout nondistended large and small bowel.  No free air, organomegaly or suspicious calcification.  No acute bony abnormality.  IMPRESSION: No acute findings.   Original Report Authenticated By: Charlett Nose, M.D.     Telemetry: No adverse rhythms Personally viewed.   EKG:  QTc this am 495, much improved!  Cardiac Studies:  ECHO - normal EF 65%, mild LVH  Assessment/Plan:  Principal Problem:   Sepsis Active Problems:   HEPATITIS C   ANEMIA, SECONDARY TO ACUTE BLOOD LOSS   THROMBOCYTOPENIA   CIRRHOSIS   BACK PAIN, CHRONIC   ALCOHOL ABUSE, HX OF   Hepatic encephalopathy   Major depression   Cocaine abuse   Polysubstance dependence   Hypernatremia   Dehydration   Bipolar disorder, unspecified   Depression   Acute encephalopathy   Cardiac enzymes elevated  52 year old male with cirrhosis, altered mental status, hypernatremia, profound weakness, prolonged QT, hypertension, psychiatric illness, mildly elevated troponin.   -Mildly elevated troponin of is likely secondary to myocardial strain in the setting of acute metabolic illness/underlying medical illness, lactic  acidosis. I agree currently with echocardiogram for further evaluation of left ventricular function. At this point, I would not advocate for heparin/ACS dose unless troponin becomes greatly elevated. Aspirin is not unreasonable in the setting as long as it is not felt to be a bleeding risk in the setting of his cirrhosis.   -Prolonged QT interval-concerning length of QT on admit, much improved this am. He is on psychiatric medications as listed above. Will follow.  -Replete KCL   No further cardiac recommendations at this time.  Will sign off, please call with questions.   SKAINS, MARK 09/29/2012, 8:57 AM

## 2012-09-29 NOTE — Progress Notes (Signed)
Patient being discharged home per MD order. All discharge instructions given to both patient and wife.

## 2012-09-29 NOTE — Care Management Note (Signed)
    Page 1 of 1   09/29/2012     3:09:52 PM   CARE MANAGEMENT NOTE 09/29/2012  Patient:  [name] [name],[name] [name]   Account Number:  [id number]  Date Initiated:  09/28/2012  Documentation initiated by:  Donn Pierini  Subjective/Objective Assessment:   Pt admitted with sepsis and acute encephalopathy     Action/Plan:   PTA pt was staying at a half-way-house- CSW consulted   Anticipated DC Date:  09/30/2012   Anticipated DC Plan:    In-house referral  Clinical Social Worker         Choice offered to / List presented to:             Status of service:  Completed, signed off Medicare Important Message given?   (If response is "NO", the following Medicare IM given date fields will be blank) Date Medicare IM given:   Date Additional Medicare IM given:    Discharge Disposition:  HOME/SELF CARE  Per UR Regulation:  Reviewed for med. necessity/level of care/duration of stay  If discussed at Long Length of Stay Meetings, dates discussed:    Comments:

## 2012-09-30 NOTE — Discharge Summary (Signed)
Physician Discharge Summary  Patient ID: [name] [name] MRN: [id number] DOB/AGE: 1960-08-27 52 y.o.  Admit date: 09/27/2012 Discharge date: 09/30/2012  Admission Diagnoses:  Discharge Diagnoses:  Principal Problem: Altered mental status Hepatic encephalopathy Dehydration Leukocytosis  Active Problems:   HEPATITIS C   ANEMIA, SECONDARY TO ACUTE BLOOD LOSS   THROMBOCYTOPENIA   CIRRHOSIS   BACK PAIN, CHRONIC   ALCOHOL ABUSE, HX OF   Hepatic encephalopathy   Major depression   Cocaine abuse   Polysubstance dependence   Hypernatremia   Dehydration   Bipolar disorder, unspecified   Depression   Acute encephalopathy   Cardiac enzymes elevated-2-D echo normal   Discharged Condition: good  Hospital Course: 52 years old male with history of polysubstance abuse dependence bipolar depression cirrhosis; currently living at the halfway home- for substance abuse rehabilitation Strong history of bipolar depression- found to be lying by the police while he was going to get some slurrpy drink from [employer]- he walked from the halfway home Acute mental status changes/altered mental status CT scan of the head negative in the ER Dehydration with possible heatstroke leukocytosis; no evidence of stroke Acute hepatic encephalopathy in the setting of mild dehydration- patient initially was started on broad-spectrum antibiotic to cover possible sepsis his white count came back down to normal with IV hydration no evidence of infection- antibiotic discontinued UA was positive- continual ceftriaxone cultures came back negative antibiotic was discontinued. Patient received IV fluids which much improvement in mental status within 24 hours to his baseline Ammonia level was mildly elevated. Continue on his lactulose History of bipolar depression with polysubstance abuse- psychiatry was consulted; did not recommend any change in his medications follow up outpatient with psychiatry Elevated troponin  cardiology was consulted- 2-D echocardiogram was normal; thought to be related to the stress related allergic troponin of 0.75- came down to zero point likely clinically asymptomatic no chest pain no shortness of breath; blood pressure was controlled Hypokalemia:> In the setting of dehydration and hydrochlorothiazide. 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: platelet count remained stable Cirrhosis with elevated LFTs Patient was [facility]- for rehabilitation followup with psychiatry  Consults: cardiology and psychiatry  Significant Diagnostic Studies: labs: sodium 138 potassium 3.3 creatinine 0.68 white cell count 3.7 platelet count 71, UA was positive, cultures negative , microbiology: urine culture: negative and radiology: CXR: normal and CT scan: no acute finding  Treatments: IV hydration and antibiotics: ceftriaxone  Discharge Exam: Blood pressure 117/82, pulse 87, temperature 97.5 F (36.4 C), temperature source Oral, resp. rate 26, height 6\' 4"  (1.93 m), weight 135.8 kg (299 lb 6.2 oz), SpO2 96.00%. General appearance: alert Resp: clear to auscultation bilaterally Cardio: regular rate and rhythm GI: mildly distended with mild ascites  Disposition: 01-halfway home  Discharge Orders   Future Orders Complete By Expires     Diet - low sodium heart healthy  As directed     Discharge instructions  As directed     Comments:      F/u with Dr Evelene Croon - Psychaitry- 1 week    Increase activity slowly  As directed     Procalcitonin  As directed         Medication List    STOP taking these medications       lisinopril-hydrochlorothiazide 20-12.5 MG per tablet  Commonly known as:  PRINZIDE,ZESTORETIC      TAKE these medications  acetaminophen 325 MG tablet  Commonly known as:  TYLENOL  Take 650 mg by mouth every 6  (six) hours as needed for pain. For pain     amantadine 100 MG capsule  Commonly known as:  SYMMETREL  Take 1 capsule (100 mg total) by mouth 3 (three) times daily.     benztropine 1 MG tablet  Commonly known as:  COGENTIN  Take 1 tablet (1 mg total) by mouth 2 (two) times daily as needed. For prevention of drug induced involuntary movements     FLUoxetine 20 MG capsule  Commonly known as:  PROZAC  Take 20 mg by mouth daily.     haloperidol 5 MG tablet  Commonly known as:  HALDOL  Take 1 tablet (5 mg total) by mouth at bedtime. For mood control     hydrOXYzine 50 MG tablet  Commonly known as:  ATARAX/VISTARIL  Take 1 tablet (50 mg total) by mouth at bedtime and may repeat dose one time if needed. For anxiety symptoms     lactulose 10 GM/15ML solution  Commonly known as:  CHRONULAC  Take 45 mLs (30 g total) by mouth 2 (two) times daily. For constipation     lisinopril 20 MG tablet  Commonly known as:  PRINIVIL  Take 1 tablet (20 mg total) by mouth daily.     OXcarbazepine 150 MG tablet  Commonly known as:  TRILEPTAL  Take 1 tablet (150 mg total) by mouth 2 (two) times daily. For mood stabilization     traZODone 100 MG tablet  Commonly known as:  DESYREL  Take 1 tablet (100 mg total) by mouth at bedtime as needed for sleep. For depression/sleep           Follow-up Information   Follow up with Georgann Housekeeper, MD In 7 days.   Contact information:   [address] [place] [address] [phone]     discharge planning time total 45 minutes  Signed: Lanett Lasorsa 09/30/2012, 9:15 AM

## 2013-01-03 ENCOUNTER — Inpatient Hospital Stay (HOSPITAL_BASED_OUTPATIENT_CLINIC_OR_DEPARTMENT_OTHER)
Admission: EM | Admit: 2013-01-03 | Discharge: 2013-01-07 | DRG: 917 | Disposition: A | Discharge status: Home / Self Care | Payer: Medicare Other | Attending: Internal Medicine | Admitting: Internal Medicine

## 2013-01-03 ENCOUNTER — Encounter (HOSPITAL_BASED_OUTPATIENT_CLINIC_OR_DEPARTMENT_OTHER): Payer: Self-pay | Admitting: *Deleted

## 2013-01-03 DIAGNOSIS — F411 Generalized anxiety disorder: Secondary | ICD-10-CM | POA: Diagnosis present

## 2013-01-03 DIAGNOSIS — F1310 Sedative, hypnotic or anxiolytic abuse, uncomplicated: Secondary | ICD-10-CM | POA: Diagnosis present

## 2013-01-03 DIAGNOSIS — D500 Iron deficiency anemia secondary to blood loss (chronic): Secondary | ICD-10-CM | POA: Diagnosis present

## 2013-01-03 DIAGNOSIS — F3130 Bipolar disorder, current episode depressed, mild or moderate severity, unspecified: Secondary | ICD-10-CM | POA: Diagnosis present

## 2013-01-03 DIAGNOSIS — K7460 Unspecified cirrhosis of liver: Secondary | ICD-10-CM | POA: Diagnosis present

## 2013-01-03 DIAGNOSIS — I10 Essential (primary) hypertension: Secondary | ICD-10-CM | POA: Diagnosis present

## 2013-01-03 DIAGNOSIS — Z91199 Patient's noncompliance with other medical treatment and regimen due to unspecified reason: Secondary | ICD-10-CM

## 2013-01-03 DIAGNOSIS — K7030 Alcoholic cirrhosis of liver without ascites: Secondary | ICD-10-CM | POA: Diagnosis present

## 2013-01-03 DIAGNOSIS — Z87891 Personal history of nicotine dependence: Secondary | ICD-10-CM

## 2013-01-03 DIAGNOSIS — T50901A Poisoning by unspecified drugs, medicaments and biological substances, accidental (unintentional), initial encounter: Principal | ICD-10-CM | POA: Diagnosis present

## 2013-01-03 DIAGNOSIS — G9340 Encephalopathy, unspecified: Secondary | ICD-10-CM

## 2013-01-03 DIAGNOSIS — F1020 Alcohol dependence, uncomplicated: Secondary | ICD-10-CM | POA: Diagnosis present

## 2013-01-03 DIAGNOSIS — K922 Gastrointestinal hemorrhage, unspecified: Secondary | ICD-10-CM | POA: Diagnosis not present

## 2013-01-03 DIAGNOSIS — T50904A Poisoning by unspecified drugs, medicaments and biological substances, undetermined, initial encounter: Secondary | ICD-10-CM | POA: Diagnosis present

## 2013-01-03 DIAGNOSIS — E669 Obesity, unspecified: Secondary | ICD-10-CM | POA: Diagnosis present

## 2013-01-03 DIAGNOSIS — Z9119 Patient's noncompliance with other medical treatment and regimen: Secondary | ICD-10-CM

## 2013-01-03 DIAGNOSIS — I8510 Secondary esophageal varices without bleeding: Secondary | ICD-10-CM | POA: Diagnosis present

## 2013-01-03 DIAGNOSIS — D696 Thrombocytopenia, unspecified: Secondary | ICD-10-CM | POA: Diagnosis present

## 2013-01-03 DIAGNOSIS — K7290 Hepatic failure, unspecified without coma: Secondary | ICD-10-CM

## 2013-01-03 DIAGNOSIS — D649 Anemia, unspecified: Secondary | ICD-10-CM

## 2013-01-03 DIAGNOSIS — K7682 Hepatic encephalopathy: Secondary | ICD-10-CM | POA: Diagnosis present

## 2013-01-03 LAB — URINE MICROSCOPIC-ADD ON

## 2013-01-03 LAB — CBC WITH DIFFERENTIAL/PLATELET
Basophils Relative: 2 % — ABNORMAL HIGH (ref 0–1)
Eosinophils Relative: 7 % — ABNORMAL HIGH (ref 0–5)
Hemoglobin: 9.3 g/dL — ABNORMAL LOW (ref 13.0–17.0)
MCH: 25.3 pg — ABNORMAL LOW (ref 26.0–34.0)
MCV: 79.3 fL (ref 78.0–100.0)
Monocytes Absolute: 0.5 10*3/uL (ref 0.1–1.0)
Monocytes Relative: 13 % — ABNORMAL HIGH (ref 3–12)
Neutrophils Relative %: 66 % (ref 43–77)
RBC: 3.67 MIL/uL — ABNORMAL LOW (ref 4.22–5.81)

## 2013-01-03 LAB — ETHANOL: Alcohol, Ethyl (B): <11 mg/dL (ref 0–11)

## 2013-01-03 LAB — URINALYSIS, ROUTINE W REFLEX MICROSCOPIC
Bilirubin Urine: NEGATIVE
Glucose, UA: NEGATIVE mg/dL
Hgb urine dipstick: NEGATIVE
Ketones, ur: NEGATIVE mg/dL
Protein, ur: 30 mg/dL — AB
Urobilinogen, UA: 1 mg/dL (ref 0.0–1.0)

## 2013-01-03 LAB — COMPREHENSIVE METABOLIC PANEL
ALT: 27 U/L (ref 0–53)
AST: 43 U/L — ABNORMAL HIGH (ref 0–37)
Alkaline Phosphatase: 90 U/L (ref 39–117)
CO2: 20 mEq/L (ref 19–32)
Chloride: 106 mEq/L (ref 96–112)
Creatinine, Ser: 0.8 mg/dL (ref 0.50–1.35)
GFR calc non Af Amer: >90 mL/min (ref >90)
Potassium: 4 mEq/L (ref 3.5–5.1)
Total Bilirubin: 0.6 mg/dL (ref 0.3–1.2)

## 2013-01-03 LAB — OCCULT BLOOD X 1 CARD TO LAB, STOOL: Fecal Occult Bld: POSITIVE — AB

## 2013-01-03 LAB — RAPID URINE DRUG SCREEN, HOSP PERFORMED
Amphetamines: NOT DETECTED
Barbiturates: NOT DETECTED
Tetrahydrocannabinol: NOT DETECTED

## 2013-01-03 LAB — ACETAMINOPHEN LEVEL: Acetaminophen (Tylenol), Serum: <15 ug/mL (ref 10–30)

## 2013-01-03 LAB — SALICYLATE LEVEL: Salicylate Lvl: <2 mg/dL — ABNORMAL LOW (ref 2.8–20.0)

## 2013-01-03 MED ORDER — LACTULOSE 10 GM/15ML PO SOLN
30.0000 g | Freq: Once | ORAL | Status: AC
  Administered 2013-01-04: 30 g via ORAL
  Filled 2013-01-03 (×2): qty 15

## 2013-01-03 NOTE — ED Notes (Addendum)
Pt brought in by EMS from wifes home pt took xanax 1mg  tabs total of 6 tabs, denies SI , HPPD at bedside

## 2013-01-03 NOTE — ED Notes (Signed)
Pt got out of bed and took off gown stating he needed to "go home".  Pt was redressed and placed back in the bed.

## 2013-01-03 NOTE — ED Provider Notes (Signed)
CSN: [id number]     Arrival date & time 01/03/13  1935 History     First MD Initiated Contact with Patient 01/03/13 1943     Chief Complaint  Patient presents with  . Drug Overdose   (Consider location/radiation/quality/duration/timing/severity/associated sxs/prior Treatment) HPI Comments: Pt visiting his wife from the group home and started experience anxiety so took a 1mg  ativan, but then because that was helping he started taking more and took a total of 6 and when started to act funny his wife called EMS.  Wife denies SI as well as pt.  Pt is intoxicated but is awake and has no complaints.  Patient is a 52 y.o. male presenting with Overdose. The history is provided by the spouse, the EMS personnel and the patient.  Drug Overdose This is a new problem. The current episode started 1 to 2 hours ago. The problem occurs constantly. The problem has not changed since onset.Associated symptoms comments: No complaints. Nothing aggravates the symptoms. Nothing relieves the symptoms. He has tried nothing for the symptoms. The treatment provided no relief.    Past Medical History  Diagnosis Date  . Thrombocytopenia   . Cirrhosis   . Hepatitis C   . Hypertension   . Anxiety   . Anemia   . Blood transfusion   . Obesity   . Pancreatitis   . Bipolar disorder, unspecified 09/27/2012  . Depression 09/27/2012   Past Surgical History  Procedure Laterality Date  . Tonsillectomy     Family History  Problem Relation Age of Onset  . Cancer - Other Mother     Uterine  . Coronary artery disease Father   . Coronary artery disease Paternal Grandfather   . Hypertension Father   . Hypertension Paternal Grandfather    History  Substance Use Topics  . Smoking status: Former Smoker    Types: Cigarettes    Quit date: 11/05/2003  . Smokeless tobacco: Not on file  . 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 states last etoh was 2011    Review of  Systems  Allergies  Trileptal  Home Medications   Current Outpatient Rx  Name  Route  Sig  Dispense  Refill  . acetaminophen (TYLENOL) 325 MG tablet   Oral   Take 650 mg by mouth every 6 (six) hours as needed for pain. For pain         . amantadine (SYMMETREL) 100 MG capsule   Oral   Take 1 capsule (100 mg total) by mouth 3 (three) times daily.   30 capsule   0   . FLUoxetine (PROZAC) 20 MG capsule   Oral   Take 20 mg by mouth daily.         . haloperidol (HALDOL) 5 MG tablet   Oral   Take 1 tablet (5 mg total) by mouth at bedtime. For mood control   30 tablet   0   . lactulose (CHRONULAC) 10 GM/15ML solution   Oral   Take 45 mLs (30 g total) by mouth 2 (two) times daily. For constipation   240 mL      . lisinopril (PRINIVIL) 20 MG tablet   Oral   Take 1 tablet (20 mg total) by mouth daily.   30 tablet   3    BP 102/64  Pulse 109  Temp(Src) 98.6 F (37 C) (Oral)  Resp 20  Ht 6\' 4"  (1.93 m)  Wt 313 lb (  141.976 kg)  BMI 38.12 kg/m2  SpO2 94% Physical Exam  Nursing note and vitals reviewed. Constitutional: He is oriented to person, place, and time. He appears well-developed and well-nourished. No distress.  Drowsy but awake  HENT:  Head: Normocephalic and atraumatic.  Mouth/Throat: Oropharynx is clear and moist.  Eyes: Conjunctivae and EOM are normal. Pupils are equal, round, and reactive to light.  Neck: Normal range of motion. Neck supple.  Cardiovascular: Normal rate, regular rhythm and intact distal pulses.   No murmur heard. Pulmonary/Chest: Effort normal and breath sounds normal. No respiratory distress. He has no wheezes. He has no rales.  Abdominal: Soft. He exhibits distension and ascites. There is no tenderness. There is no rebound and no guarding.  Musculoskeletal: Normal range of motion. He exhibits no edema and no tenderness.  Neurological: He is alert and oriented to person, place, and time. He has normal strength. No sensory deficit.   Skin: Skin is warm and dry. No rash noted. No erythema.  Psychiatric: He has a normal mood and affect. His behavior is normal.    ED Course   Procedures (including critical care time)  Labs Reviewed  CBC WITH DIFFERENTIAL - Abnormal; Notable for the following:    RBC 3.67 (*)    Hemoglobin 9.3 (*)    HCT 29.1 (*)    MCH 25.3 (*)    RDW 16.0 (*)    Platelets 113 (*)    Monocytes Relative 13 (*)    Eosinophils Relative 7 (*)    Basophils Relative 2 (*)    Lymphs Abs 0.5 (*)    All other components within normal limits  COMPREHENSIVE METABOLIC PANEL - Abnormal; Notable for the following:    Glucose, Bld 129 (*)    Albumin 3.4 (*)    AST 43 (*)    All other components within normal limits  URINALYSIS, ROUTINE W REFLEX MICROSCOPIC - Abnormal; Notable for the following:    Color, Urine AMBER (*)    Protein, ur 30 (*)    All other components within normal limits  URINE RAPID DRUG SCREEN (HOSP PERFORMED) - Abnormal; Notable for the following:    Opiates POSITIVE (*)    Benzodiazepines POSITIVE (*)    All other components within normal limits  SALICYLATE LEVEL - Abnormal; Notable for the following:    Salicylate Lvl <2.0 (*)    All other components within normal limits  AMMONIA - Abnormal; Notable for the following:    Ammonia 88 (*)    All other components within normal limits  URINE MICROSCOPIC-ADD ON - Abnormal; Notable for the following:    Bacteria, UA FEW (*)    Casts HYALINE CASTS (*)    All other components within normal limits  ETHANOL  ACETAMINOPHEN LEVEL   No results found. 1. Anemia   2. Hepatic encephalopathy     MDM   Patient presents by EMS from his wife's house. He lives at a group home but was visiting his wife who was experiencing anxiety and took one 1 mg Ativan. That was helping the anxiety symptoms he continued to take the Ativan a total of 6 and then started to feel tired and dizzy. Wife denied any suicidal ideations patient is awake and also  denies this but does appear intoxicated from benzodiazepine misuse. Patient does have a significant history of cirrhosis thrombocytopenia pancreatitis and hepatitis.  Pt also has stopped taking his lactulose.  Mild asterixsis.  CBC, CMP, UA, UDS, EtOH, acetaminophen, salicylate, ammonia pending.  Patient will need observation to allow the benzodiazepines to wear off to insure he returns to his baseline mental status.  9:32 PM Pt has new anemia from baseline at 9 now from 13 and hemoccult positive and plts stable.  Ammonia elevated at 88 but pt states he stopped taking his lactulose due to inconvenience of bathroom times and pt does have asterixsis and some symtpoms of hepatic encephalopathy today.  His last BM today.  Etoh, tylenol and ASA level wnl.CMP stable.  11:16 PM Pt still acting unusual.  Taking off his clothes, climbing out of bed with hepatic encephalopathy. Will admit for further care. Patient given lactulose  Gwyneth Sprout, MD 01/03/13 2354

## 2013-01-04 ENCOUNTER — Inpatient Hospital Stay (HOSPITAL_COMMUNITY): Payer: Medicare Other

## 2013-01-04 ENCOUNTER — Encounter (HOSPITAL_COMMUNITY): Payer: Self-pay | Admitting: Internal Medicine

## 2013-01-04 DIAGNOSIS — D649 Anemia, unspecified: Secondary | ICD-10-CM

## 2013-01-04 DIAGNOSIS — K922 Gastrointestinal hemorrhage, unspecified: Secondary | ICD-10-CM

## 2013-01-04 DIAGNOSIS — G9340 Encephalopathy, unspecified: Secondary | ICD-10-CM

## 2013-01-04 LAB — GLUCOSE, CAPILLARY: Glucose-Capillary: 126 mg/dL — ABNORMAL HIGH (ref 70–99)

## 2013-01-04 LAB — COMPREHENSIVE METABOLIC PANEL
ALT: 27 U/L (ref 0–53)
AST: 47 U/L — ABNORMAL HIGH (ref 0–37)
CO2: 21 mEq/L (ref 19–32)
Chloride: 108 mEq/L (ref 96–112)
GFR calc non Af Amer: >90 mL/min (ref >90)
Potassium: 3.5 mEq/L (ref 3.5–5.1)
Sodium: 141 mEq/L (ref 135–145)
Total Bilirubin: 0.7 mg/dL (ref 0.3–1.2)

## 2013-01-04 LAB — CBC
Platelets: 92 10*3/uL — ABNORMAL LOW (ref 150–400)
Platelets: 101 10*3/uL — ABNORMAL LOW (ref 150–400)
RBC: 3.69 MIL/uL — ABNORMAL LOW (ref 4.22–5.81)
RBC: 3.8 MIL/uL — ABNORMAL LOW (ref 4.22–5.81)
RDW: 16.3 % — ABNORMAL HIGH (ref 11.5–15.5)
WBC: 3.9 10*3/uL — ABNORMAL LOW (ref 4.0–10.5)
WBC: 3.8 10*3/uL — ABNORMAL LOW (ref 4.0–10.5)

## 2013-01-04 MED ORDER — SODIUM CHLORIDE 0.9 % IV SOLN
25.0000 ug/h | INTRAVENOUS | Status: DC
  Administered 2013-01-04 – 2013-01-05 (×2): 25 ug/h via INTRAVENOUS
  Filled 2013-01-04 (×4): qty 1

## 2013-01-04 MED ORDER — THIAMINE HCL 100 MG/ML IJ SOLN
100.0000 mg | Freq: Every day | INTRAMUSCULAR | Status: DC
  Filled 2013-01-04: qty 1

## 2013-01-04 MED ORDER — SODIUM CHLORIDE 0.9 % IV SOLN
80.0000 mg | Freq: Once | INTRAVENOUS | Status: AC
  Administered 2013-01-04: 80 mg via INTRAVENOUS
  Filled 2013-01-04: qty 80

## 2013-01-04 MED ORDER — SODIUM CHLORIDE 0.9 % IV SOLN
8.0000 mg/h | INTRAVENOUS | Status: DC
  Administered 2013-01-04 – 2013-01-05 (×3): 8 mg/h via INTRAVENOUS
  Filled 2013-01-04 (×6): qty 80

## 2013-01-04 MED ORDER — HALOPERIDOL LACTATE 5 MG/ML IJ SOLN
2.0000 mg | Freq: Once | INTRAMUSCULAR | Status: AC
  Administered 2013-01-04: 2 mg via INTRAVENOUS
  Filled 2013-01-04: qty 1

## 2013-01-04 MED ORDER — HYDROMORPHONE HCL PF 1 MG/ML IJ SOLN
1.0000 mg | INTRAMUSCULAR | Status: DC | PRN
  Administered 2013-01-04 (×2): 1 mg via INTRAVENOUS
  Filled 2013-01-04 (×2): qty 1

## 2013-01-04 MED ORDER — HALOPERIDOL LACTATE 5 MG/ML IJ SOLN
INTRAMUSCULAR | Status: AC
  Filled 2013-01-04: qty 1

## 2013-01-04 MED ORDER — OCTREOTIDE LOAD VIA INFUSION
50.0000 ug | Freq: Once | INTRAVENOUS | Status: AC
  Administered 2013-01-04: 50 ug via INTRAVENOUS
  Filled 2013-01-04: qty 25

## 2013-01-04 MED ORDER — HALOPERIDOL LACTATE 5 MG/ML IJ SOLN
5.0000 mg | Freq: Once | INTRAMUSCULAR | Status: AC
  Administered 2013-01-04: 5 mg via INTRAVENOUS

## 2013-01-04 MED ORDER — ACETAMINOPHEN 325 MG PO TABS
650.0000 mg | ORAL_TABLET | Freq: Four times a day (QID) | ORAL | Status: DC | PRN
  Administered 2013-01-04: 650 mg via ORAL
  Filled 2013-01-04: qty 2

## 2013-01-04 MED ORDER — HALOPERIDOL LACTATE 5 MG/ML IJ SOLN: 5.0000 mg | Freq: Four times a day (QID) | INTRAMUSCULAR | Status: DC | PRN

## 2013-01-04 MED ORDER — ONDANSETRON HCL 4 MG/2ML IJ SOLN: 4.0000 mg | Freq: Four times a day (QID) | INTRAMUSCULAR | Status: DC | PRN

## 2013-01-04 MED ORDER — HYDRALAZINE HCL 20 MG/ML IJ SOLN: 10.0000 mg | INTRAMUSCULAR | Status: DC | PRN

## 2013-01-04 MED ORDER — AMANTADINE HCL 100 MG PO CAPS
100.0000 mg | ORAL_CAPSULE | Freq: Three times a day (TID) | ORAL | Status: DC
  Administered 2013-01-04 – 2013-01-07 (×10): 100 mg via ORAL
  Filled 2013-01-04 (×12): qty 1

## 2013-01-04 MED ORDER — HALOPERIDOL 5 MG PO TABS
5.0000 mg | ORAL_TABLET | Freq: Every day | ORAL | Status: DC
  Administered 2013-01-04: 5 mg via ORAL
  Filled 2013-01-04 (×2): qty 1

## 2013-01-04 MED ORDER — SODIUM CHLORIDE 0.9 % IJ SOLN
3.0000 mL | Freq: Two times a day (BID) | INTRAMUSCULAR | Status: DC
  Administered 2013-01-04 – 2013-01-07 (×5): 3 mL via INTRAVENOUS

## 2013-01-04 MED ORDER — ONDANSETRON HCL 4 MG PO TABS: 4.0000 mg | ORAL_TABLET | Freq: Four times a day (QID) | ORAL | Status: DC | PRN

## 2013-01-04 MED ORDER — FLUOXETINE HCL 20 MG PO CAPS
20.0000 mg | ORAL_CAPSULE | Freq: Every day | ORAL | Status: DC
  Administered 2013-01-04 – 2013-01-07 (×4): 20 mg via ORAL
  Filled 2013-01-04 (×4): qty 1

## 2013-01-04 MED ORDER — ACETAMINOPHEN 650 MG RE SUPP: 650.0000 mg | Freq: Four times a day (QID) | RECTAL | Status: DC | PRN

## 2013-01-04 MED ORDER — SODIUM CHLORIDE 0.9 % IJ SOLN
3.0000 mL | Freq: Two times a day (BID) | INTRAMUSCULAR | Status: DC
Start: 2013-01-04 — End: 2013-01-07
  Administered 2013-01-04 – 2013-01-07 (×7): 3 mL via INTRAVENOUS

## 2013-01-04 MED ORDER — PANTOPRAZOLE SODIUM 40 MG IV SOLR: 40.0000 mg | Freq: Two times a day (BID) | INTRAVENOUS | Status: DC

## 2013-01-04 MED ORDER — PROPRANOLOL HCL 20 MG PO TABS
20.0000 mg | ORAL_TABLET | Freq: Two times a day (BID) | ORAL | Status: DC
  Administered 2013-01-04 – 2013-01-07 (×7): 20 mg via ORAL
  Filled 2013-01-04 (×8): qty 1

## 2013-01-04 MED ORDER — VITAMIN B-1 100 MG PO TABS
100.0000 mg | ORAL_TABLET | Freq: Every day | ORAL | Status: DC
  Administered 2013-01-04 – 2013-01-07 (×4): 100 mg via ORAL
  Filled 2013-01-04 (×4): qty 1

## 2013-01-04 MED ORDER — LACTULOSE 10 GM/15ML PO SOLN
30.0000 g | Freq: Three times a day (TID) | ORAL | Status: DC
  Administered 2013-01-04 – 2013-01-07 (×10): 30 g via ORAL
  Filled 2013-01-04 (×12): qty 45

## 2013-01-04 MED ORDER — DEXTROSE 5 % IV SOLN
1.0000 g | Freq: Three times a day (TID) | INTRAVENOUS | Status: DC
  Filled 2013-01-04 (×3): qty 1

## 2013-01-04 NOTE — ED Notes (Signed)
Report given to [facility] with [facility]

## 2013-01-04 NOTE — H&P (Addendum)
[facility] History and Physical  [name] [name] XBJ:[id number] DOB: June 06, 1960 DOA: 01/03/2013  Referring physician: ER physician. Transferred from med [employer]. PCP: [name] Housekeeper, MD  Chief Complaint: Confusion.  HPI: [name] [name] is a 52 y.o. male history of hepatitis C, cirrhosis of liver, bipolar disorder and hypertension who's presently in recovery house was taken to his home yesterday by his wife as his dog was dying. Patient has been in the recovery house and being detoxed off Xanax abuse. As per wife patient has not been on Xanax for last 2 months. Yesterday patient wife feels that he may have stolen her Xanax and taken few of them. Patient's wife states that 20 tablets of 1 mg Xanax were missing from her bottle. Patient later was found to be very confused and thrashing around the house and was brought to the ER. Urine drug screen was positive for benzodiazepine and opiates. Patient's labs also show an elevated ammonia levels and in the ER physician questioned if he was taking lactulose patient states that because he has had frequent diarrhea he stopped taking them as advised. Patient in addition was found to be anemic with hemoglobin around 9 in the last one previously was on 13. Stool for occult blood was positive. Patient has been admitted for further management. Presently patient is oriented to his name but still confused.  Review of Systems: As presented in the history of presenting illness, rest negative.  Past Medical History  Diagnosis Date  . Thrombocytopenia   . Cirrhosis   . Hepatitis C   . Hypertension   . Anxiety   . Anemia   . Blood transfusion   . Obesity   . Pancreatitis   . Bipolar disorder, unspecified 09/27/2012  . Depression 09/27/2012   Past Surgical History  Procedure Laterality Date  . Tonsillectomy     Social History:  reports that he quit smoking about 9 years ago. His smoking use included Cigarettes. He smoked 0.00 packs per day. He does  not have any smokeless tobacco history on file. He reports that  drinks alcohol. He reports that he uses illicit drugs (Cocaine, Hydrocodone, and Benzodiazepines). Home. where does patient live-- Can do ADLs. Can patient participate in ADLs?  Allergies  Allergen Reactions  . Trileptal [Oxcarbazepine] Other (See Comments)    Sleep walking & hallucinations    Family History  Problem Relation Age of Onset  . Cancer - Other Mother     Uterine  . Coronary artery disease Father   . Coronary artery disease Paternal Grandfather   . Hypertension Father   . Hypertension Paternal Grandfather       Prior to Admission medications   Medication Sig Start Date End Date Taking? Authorizing Provider  acetaminophen (TYLENOL) 325 MG tablet Take 650 mg by mouth every 6 (six) hours as needed for pain. For pain    Historical Provider, MD  amantadine (SYMMETREL) 100 MG capsule Take 1 capsule (100 mg total) by mouth 3 (three) times daily. 09/29/12   [name] Housekeeper, MD  FLUoxetine (PROZAC) 20 MG capsule Take 20 mg by mouth daily.    Historical Provider, MD  haloperidol (HALDOL) 5 MG tablet Take 1 tablet (5 mg total) by mouth at bedtime. For mood control 09/29/12   [name] Housekeeper, MD  lactulose (CHRONULAC) 10 GM/15ML solution Take 45 mLs (30 g total) by mouth 2 (two) times daily. For constipation 09/20/12   Sanjuana Kava, NP  lisinopril (PRINIVIL) 20 MG tablet Take 1 tablet (20 mg total)  by mouth daily. 09/29/12   [name] Housekeeper, MD   Physical Exam: Filed Vitals:   01/03/13 2119 01/03/13 2251 01/04/13 0004 01/04/13 0100  BP: 128/77 119/61 128/69   Pulse: 107 111 102   Temp:   98.5 F (36.9 C)   TempSrc:   Oral   Resp: 17  20   Height:    6\' 4"  (1.93 m)  Weight:    143.3 kg (315 lb 14.7 oz)  SpO2: 98% 96% 96%      General:  Well-developed and nourished.  Eyes: Anicteric no pallor.  ENT: No discharge from ears eyes nose mouth.  Neck: No mass felt.  Cardiovascular: S1-S2 heard.  Respiratory: No  rhonchi or crepitations.  Abdomen: Soft nontender bowel sounds present.  Skin: No rash.  Musculoskeletal: No edema.  Psychiatric: Patient is confused.  Neurologic: ) And moves all extremities.  Labs on Admission:  Basic Metabolic Panel:  Recent Labs Lab 01/03/13 2025  NA 139  K 4.0  CL 106  CO2 20  GLUCOSE 129*  BUN 16  CREATININE 0.80  CALCIUM 9.9   Liver Function Tests:  Recent Labs Lab 01/03/13 2025  AST 43*  ALT 27  ALKPHOS 90  BILITOT 0.6  PROT 6.8  ALBUMIN 3.4*   No results found for this basename: LIPASE, AMYLASE,  in the last 168 hours  Recent Labs Lab 01/03/13 2025  AMMONIA 88*   CBC:  Recent Labs Lab 01/03/13 2025  WBC 4.2  NEUTROABS 2.8  HGB 9.3*  HCT 29.1*  MCV 79.3  PLT 113*   Cardiac Enzymes: No results found for this basename: CKTOTAL, CKMB, CKMBINDEX, TROPONINI,  in the last 168 hours  BNP (last 3 results) No results found for this basename: PROBNP,  in the last 8760 hours CBG: No results found for this basename: GLUCAP,  in the last 168 hours  Radiological Exams on Admission: No results found.   Assessment/Plan Active Problems:   THROMBOCYTOPENIA   CIRRHOSIS   Acute encephalopathy   Anemia   GI bleed   1. Acute encephalopathy most likely secondary hepatic encephalopathy - continue with lactulose 30 g by mouth 3 times a day. Check CT head. 2. GI bleed - patient has had previous GI bleed and in 2011 EGD had shown esophageal varices. At this time I have placed patient n.p.o. except medications. Protonix infusion and octreotide infusion. Closely follow CBC for any further worsening of hemoglobin and may require transfusion. Consult GI in a.m. Presently hemodynamically stable. Empiric antibiotics. 3. Cirrhosis of liver and hepatitis C - per gastroenterologist. 4. Anemia probably from blood loss - follow CBC. 5. Chronic thrombocytopenia - probably from cirrhosis. 6. History of drug abuse - I have ordered one dose of Haldol  IV. Closely follow clinically. 7. History of depression - presently did not have any suicidal ideation. 8. Hypertension - patient presently is on when necessary IV hydralazine for systolic blood pressure more than 160.  Per patient's wife patient has not had alcohol for more than 4 years.  Code Status: Full code.  Family Communication: Patient's wife.  Disposition Plan: Admit to inpatient.    [name] [name] N. [facility] Pager [phone].  If 7PM-7AM, please contact night-coverage [email or web address] Password [facility] 01/04/2013, 3:14 AM

## 2013-01-04 NOTE — Consult Note (Signed)
[facility] CONSULT Reason for consult:? GI Bleed  Referring Physician: Dr Sheran Lawless is an 52 y.o. male.  HPI: patient has a history about all use. He reports he has had no alcohol since 2011. In 2011 he had dropped and hemoglobin and had EGD showing varices. He has been hospitalized for acute confusion and had a drop and hemoglobin to 9.1 from his baseline hemoglobin which is reported to have been normal. 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 This tool is positive for occult blood. I was felt that he needed EGD to evaluate for source of his bleeding. He currently is receiving octreotide empirically   Past Medical History  Diagnosis Date  . Thrombocytopenia   . Cirrhosis   . Hepatitis C   . Hypertension   . Anxiety   . Anemia   . Blood transfusion   . Obesity   . Pancreatitis   . Bipolar disorder, unspecified 09/27/2012  . Depression 09/27/2012    Past Surgical History  Procedure Laterality Date  . Tonsillectomy      Family History  Problem Relation Age of Onset  . Cancer - Other Mother     Uterine  . Coronary artery disease Father   . Coronary artery disease Paternal Grandfather   . Hypertension Father   . Hypertension Paternal Grandfather     Social History:  reports that he quit smoking about 9 years ago. His smoking use included Cigarettes. He smoked 0.00 packs per day. He does not have any smokeless tobacco history on file. He reports that  drinks alcohol. He reports that he uses illicit drugs (Cocaine, Hydrocodone, and Benzodiazepines).  Allergies:  Allergies  Allergen Reactions  . Trileptal [Oxcarbazepine] Other (See Comments)    Sleep walking & hallucinations    Medications; . amantadine  100 mg Oral TID  . FLUoxetine  20 mg Oral Daily  . haloperidol  5 mg Oral QHS  . lactulose  30 g Oral TID  . octreotide  50 mcg Intravenous Once  . [START ON 01/07/2013] pantoprazole (PROTONIX) IV  40 mg Intravenous  Q12H  . propranolol  20 mg Oral BID  . sodium chloride  3 mL Intravenous Q12H  . sodium chloride  3 mL Intravenous Q12H  . thiamine  100 mg Oral Daily   PRN Meds acetaminophen, acetaminophen, hydrALAZINE, ondansetron (ZOFRAN) IV, ondansetron Results for orders placed during the hospital encounter of 01/03/13 (from the past 48 hour(s))  URINALYSIS, ROUTINE W REFLEX MICROSCOPIC     Status: Abnormal   Collection Time    01/03/13  7:45 PM      Result Value Range   Color, Urine AMBER (*) YELLOW   Comment: BIOCHEMICALS MAY BE AFFECTED BY COLOR   APPearance CLEAR  CLEAR   Specific Gravity, Urine 1.030  1.005 - 1.030   pH 5.5  5.0 - 8.0   Glucose, UA NEGATIVE  NEGATIVE mg/dL   Hgb urine dipstick NEGATIVE  NEGATIVE   Bilirubin Urine NEGATIVE  NEGATIVE   Ketones, ur NEGATIVE  NEGATIVE mg/dL   Protein, ur 30 (*) NEGATIVE mg/dL   Urobilinogen, UA 1.0  0.0 - 1.0 mg/dL   Nitrite NEGATIVE  NEGATIVE   Leukocytes, UA NEGATIVE  NEGATIVE  URINE RAPID DRUG SCREEN (HOSP PERFORMED)     Status: Abnormal   Collection Time    01/03/13  7:45 PM      Result Value Range   Opiates POSITIVE (*) NONE DETECTED  Cocaine NONE DETECTED  NONE DETECTED   Benzodiazepines POSITIVE (*) NONE DETECTED   Amphetamines NONE DETECTED  NONE DETECTED   Tetrahydrocannabinol NONE DETECTED  NONE DETECTED   Barbiturates NONE DETECTED  NONE DETECTED   Comment:            DRUG SCREEN FOR MEDICAL PURPOSES     ONLY.  IF CONFIRMATION IS NEEDED     FOR ANY PURPOSE, NOTIFY LAB     WITHIN 5 DAYS.                LOWEST DETECTABLE LIMITS     FOR URINE DRUG SCREEN     Drug Class       Cutoff (ng/mL)     Amphetamine      1000     Barbiturate      200     Benzodiazepine   200     Tricyclics       300     Opiates          300     Cocaine          300     THC              50  URINE MICROSCOPIC-ADD ON     Status: Abnormal   Collection Time    01/03/13  7:45 PM      Result Value Range   Squamous Epithelial / LPF RARE  RARE    WBC, UA 0-2  <3 WBC/hpf   RBC / HPF 0-2  <3 RBC/hpf   Bacteria, UA FEW (*) RARE   Casts HYALINE CASTS (*) NEGATIVE   Urine-Other MUCOUS PRESENT    CBC WITH DIFFERENTIAL     Status: Abnormal   Collection Time    01/03/13  8:25 PM      Result Value Range   WBC 4.2  4.0 - 10.5 K/uL   RBC 3.67 (*) 4.22 - 5.81 MIL/uL   Hemoglobin 9.3 (*) 13.0 - 17.0 g/dL   HCT 16.1 (*) 09.6 - 04.5 %   MCV 79.3  78.0 - 100.0 fL   MCH 25.3 (*) 26.0 - 34.0 pg   MCHC 32.0  30.0 - 36.0 g/dL   RDW 40.9 (*) 81.1 - 91.4 %   Platelets 113 (*) 150 - 400 K/uL   Comment: PLATELET COUNT CONFIRMED BY SMEAR   Neutrophils Relative % 66  43 - 77 %   Lymphocytes Relative 12  12 - 46 %   Monocytes Relative 13 (*) 3 - 12 %   Eosinophils Relative 7 (*) 0 - 5 %   Basophils Relative 2 (*) 0 - 1 %   Neutro Abs 2.8  1.7 - 7.7 K/uL   Lymphs Abs 0.5 (*) 0.7 - 4.0 K/uL   Monocytes Absolute 0.5  0.1 - 1.0 K/uL   Eosinophils Absolute 0.3  0.0 - 0.7 K/uL   Basophils Absolute 0.1  0.0 - 0.1 K/uL   RBC Morphology ELLIPTOCYTES     Smear Review LARGE PLATELETS PRESENT     Comment: PLATELETS APPEAR ADEQUATE     PLATELET COUNT CONFIRMED BY SMEAR  COMPREHENSIVE METABOLIC PANEL     Status: Abnormal   Collection Time    01/03/13  8:25 PM      Result Value Range   Sodium 139  135 - 145 mEq/L   Potassium 4.0  3.5 - 5.1 mEq/L   Chloride 106  96 - 112 mEq/L   CO2 20  19 - 32 mEq/L   Glucose, Bld 129 (*) 70 - 99 mg/dL   BUN 16  6 - 23 mg/dL   Creatinine, Ser 1.61  0.50 - 1.35 mg/dL   Calcium 9.9  8.4 - 09.6 mg/dL   Total Protein 6.8  6.0 - 8.3 g/dL   Albumin 3.4 (*) 3.5 - 5.2 g/dL   AST 43 (*) 0 - 37 U/L   ALT 27  0 - 53 U/L   Alkaline Phosphatase 90  39 - 117 U/L   Total Bilirubin 0.6  0.3 - 1.2 mg/dL   GFR calc non Af Amer >90  >90 mL/min   GFR calc Af Amer >90  >90 mL/min   Comment: (NOTE)     The eGFR has been calculated using the CKD EPI equation.     This calculation has not been validated in all clinical situations.      eGFR's persistently <90 mL/min signify possible Chronic Kidney     Disease.  ETHANOL     Status: None   Collection Time    01/03/13  8:25 PM      Result Value Range   Alcohol, Ethyl (B) <11  0 - 11 mg/dL   Comment:            LOWEST DETECTABLE LIMIT FOR     SERUM ALCOHOL IS 11 mg/dL     FOR MEDICAL PURPOSES ONLY     REPEATED TO VERIFY  ACETAMINOPHEN LEVEL     Status: None   Collection Time    01/03/13  8:25 PM      Result Value Range   Acetaminophen (Tylenol), Serum <15.0  10 - 30 ug/mL   Comment:            THERAPEUTIC CONCENTRATIONS VARY     SIGNIFICANTLY. A RANGE OF 10-30     ug/mL MAY BE AN EFFECTIVE     CONCENTRATION FOR MANY PATIENTS.     HOWEVER, SOME ARE BEST TREATED     AT CONCENTRATIONS OUTSIDE THIS     RANGE.     ACETAMINOPHEN CONCENTRATIONS     >150 ug/mL AT 4 HOURS AFTER     INGESTION AND >50 ug/mL AT 12     HOURS AFTER INGESTION ARE     OFTEN ASSOCIATED WITH TOXIC     REACTIONS.     REPEATED TO VERIFY  SALICYLATE LEVEL     Status: Abnormal   Collection Time    01/03/13  8:25 PM      Result Value Range   Salicylate Lvl <2.0 (*) 2.8 - 20.0 mg/dL   Comment: REPEATED TO VERIFY  AMMONIA     Status: Abnormal   Collection Time    01/03/13  8:25 PM      Result Value Range   Ammonia 88 (*) 11 - 60 umol/L  OCCULT BLOOD X 1 CARD TO LAB, STOOL     Status: Abnormal   Collection Time    01/03/13 11:00 PM      Result Value Range   Fecal Occult Bld POSITIVE (*) NEGATIVE  MRSA PCR SCREENING     Status: None   Collection Time    01/04/13  1:37 AM      Result Value Range   MRSA by PCR NEGATIVE  NEGATIVE   Comment:            The GeneXpert MRSA Assay (FDA     approved for NASAL specimens     only), is one  component of a     comprehensive MRSA colonization     surveillance program. It is not     intended to diagnose MRSA     infection nor to guide or     monitor treatment for     MRSA infections.  CBC     Status: Abnormal   Collection Time    01/04/13  3:20  AM      Result Value Range   WBC 3.9 (*) 4.0 - 10.5 K/uL   RBC 3.69 (*) 4.22 - 5.81 MIL/uL   Hemoglobin 9.1 (*) 13.0 - 17.0 g/dL   HCT 16.1 (*) 09.6 - 04.5 %   MCV 76.7 (*) 78.0 - 100.0 fL   MCH 24.7 (*) 26.0 - 34.0 pg   MCHC 32.2  30.0 - 36.0 g/dL   RDW 40.9 (*) 81.1 - 91.4 %   Platelets 92 (*) 150 - 400 K/uL   Comment: PLATELET COUNT CONFIRMED BY SMEAR     REPEATED TO VERIFY  COMPREHENSIVE METABOLIC PANEL     Status: Abnormal   Collection Time    01/04/13  3:20 AM      Result Value Range   Sodium 141  135 - 145 mEq/L   Potassium 3.5  3.5 - 5.1 mEq/L   Chloride 108  96 - 112 mEq/L   CO2 21  19 - 32 mEq/L   Glucose, Bld 118 (*) 70 - 99 mg/dL   BUN 14  6 - 23 mg/dL   Creatinine, Ser 7.82  0.50 - 1.35 mg/dL   Calcium 9.0  8.4 - 95.6 mg/dL   Total Protein 6.3  6.0 - 8.3 g/dL   Albumin 3.2 (*) 3.5 - 5.2 g/dL   AST 47 (*) 0 - 37 U/L   ALT 27  0 - 53 U/L   Alkaline Phosphatase 71  39 - 117 U/L   Total Bilirubin 0.7  0.3 - 1.2 mg/dL   GFR calc non Af Amer >90  >90 mL/min   GFR calc Af Amer >90  >90 mL/min   Comment: (NOTE)     The eGFR has been calculated using the CKD EPI equation.     This calculation has not been validated in all clinical situations.     eGFR's persistently <90 mL/min signify possible Chronic Kidney     Disease.  AMMONIA     Status: Abnormal   Collection Time    01/04/13  4:00 AM      Result Value Range   Ammonia 77 (*) 11 - 60 umol/L  GLUCOSE, CAPILLARY     Status: Abnormal   Collection Time    01/04/13  5:56 AM      Result Value Range   Glucose-Capillary 116 (*) 70 - 99 mg/dL  AMMONIA     Status: None   Collection Time    01/04/13  8:36 AM      Result Value Range   Ammonia 48  11 - 60 umol/L    No results found.             Blood pressure 131/80, pulse 75, temperature 97.6 F (36.4 C), temperature source Oral, resp. rate 24, height 6\' 4"  (1.93 m), weight 143.3 kg (315 lb 14.7 oz), SpO2 97.00%.  Physical exam:   General--alert male  who appears somewhat groggy but is oriented  Heart--regular rate and rhythm without murmurs are gallops  Lungs--clear  Abdomen--obese nontender. No gross ascites   Assessment: 1. Acute mental status changes.  Suspect this is probably from Xanax rather than hepatic encephalopathy. According to the labs, his hepatic function is reasonably good and he has had no alcohol or share years. 2. 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 He has had no alcohol now for 3 years 3. Gallstones. Present on prior ultrasound  Plan: Go ahead and range selected EGD tomorrow. Have discussed this with the patient is agreeable. Continue protonix and octreotide for now.   Trebor Galdamez JR,Riyansh Gerstner L 01/04/2013, 2:09 PM

## 2013-01-04 NOTE — Progress Notes (Signed)
Subjective: Pt alert/ oriented x3 Reports- taking 6 xanax at home Elevated Ammonia- mild No Abd pain, no N/V  Objective: Vital signs in last 24 hours: Temp:  [98.4 F (36.9 C)-98.6 F (37 C)] 98.4 F (36.9 C) (08/20 0350) Pulse Rate:  [100-111] 100 (08/20 0350) Resp:  [14-20] 14 (08/20 0350) BP: (102-129)/(61-78) 129/78 mmHg (08/20 0350) SpO2:  [90 %-98 %] 96 % (08/20 0350) Weight:  [141.976 kg (313 lb)-143.3 kg (315 lb 14.7 oz)] 143.3 kg (315 lb 14.7 oz) (08/20 0350) Weight change:     Intake/Output from previous day: 08/19 0701 - 08/20 0700 In: 50 [I.V.:50] Out: -  Intake/Output this shift:    General appearance: alert Resp: clear to auscultation bilaterally Cardio: regular rate and rhythm GI: soft, non tender, mild ascites  Lab Results:  Recent Labs  01/03/13 2025 01/04/13 0320  WBC 4.2 3.9*  HGB 9.3* 9.1*  HCT 29.1* 28.3*  PLT 113* 92*   BMET  Recent Labs  01/03/13 2025 01/04/13 0320  NA 139 141  K 4.0 3.5  CL 106 108  CO2 20 21  GLUCOSE 129* 118*  BUN 16 14  CREATININE 0.80 0.71  CALCIUM 9.9 9.0    Studies/Results: No results found.  Medications: I have reviewed the patient's current medications.  Assessment/Plan: MS changes- combination of Benzo/ Hepatic Encephalopathy Continue LActulose x3 time Repeat Ammonia- MS improved Cirrhosis: may need low dose BB/ +/- Aldactone? Anemia/ low platlets- h/o Varies- ? Bleed- GI consult- ? Need EGD continue  PPI/ Octreotide drip  D/c ABX- No evidence of SBP HTN- hold ACE Bipoloar- continue Haldol at night/ prozac  LOS: 1 day   Fauna Neuner 01/04/2013, 7:42 AM

## 2013-01-04 NOTE — Progress Notes (Signed)
Utilization Review Completed.Marland Reine T8/20/2014  

## 2013-01-04 NOTE — ED Notes (Signed)
Report called to nurse on 2600

## 2013-01-05 ENCOUNTER — Encounter (HOSPITAL_COMMUNITY): Payer: Self-pay

## 2013-01-05 ENCOUNTER — Encounter (HOSPITAL_COMMUNITY)
Admission: EM | Disposition: A | Discharge status: Home / Self Care | Payer: Self-pay | Source: Home / Self Care | Attending: Internal Medicine

## 2013-01-05 HISTORY — PX: ESOPHAGOGASTRODUODENOSCOPY: SHX5428

## 2013-01-05 LAB — CBC
HCT: 31.6 % — ABNORMAL LOW (ref 39.0–52.0)
Hemoglobin: 9.9 g/dL — ABNORMAL LOW (ref 13.0–17.0)
MCHC: 31.3 g/dL (ref 30.0–36.0)
RDW: 16.2 % — ABNORMAL HIGH (ref 11.5–15.5)
WBC: 4.6 10*3/uL (ref 4.0–10.5)

## 2013-01-05 LAB — GLUCOSE, CAPILLARY
Glucose-Capillary: 109 mg/dL — ABNORMAL HIGH (ref 70–99)
Glucose-Capillary: 118 mg/dL — ABNORMAL HIGH (ref 70–99)
Glucose-Capillary: 110 mg/dL — ABNORMAL HIGH (ref 70–99)

## 2013-01-05 SURGERY — EGD (ESOPHAGOGASTRODUODENOSCOPY)
Anesthesia: Moderate Sedation

## 2013-01-05 MED ORDER — BUTAMBEN-TETRACAINE-BENZOCAINE 2-2-14 % EX AERO
INHALATION_SPRAY | CUTANEOUS | Status: DC | PRN
  Administered 2013-01-05: 2 via TOPICAL

## 2013-01-05 MED ORDER — SODIUM CHLORIDE 0.9 % IV SOLN
INTRAVENOUS | Status: DC
  Administered 2013-01-05: 11:00:00 via INTRAVENOUS

## 2013-01-05 MED ORDER — AMANTADINE HCL 100 MG PO CAPS: 100.0000 mg | ORAL_CAPSULE | Freq: Three times a day (TID) | ORAL | Status: DC

## 2013-01-05 MED ORDER — FENTANYL CITRATE 0.05 MG/ML IJ SOLN
INTRAMUSCULAR | Status: DC | PRN
  Administered 2013-01-05 (×2): 25 ug via INTRAVENOUS

## 2013-01-05 MED ORDER — DIPHENHYDRAMINE HCL 50 MG/ML IJ SOLN
INTRAMUSCULAR | Status: AC
  Filled 2013-01-05: qty 1

## 2013-01-05 MED ORDER — PANTOPRAZOLE SODIUM 40 MG PO TBEC
40.0000 mg | DELAYED_RELEASE_TABLET | Freq: Every day | ORAL | Status: DC
  Administered 2013-01-05 – 2013-01-07 (×3): 40 mg via ORAL
  Filled 2013-01-05 (×3): qty 1

## 2013-01-05 MED ORDER — PROPRANOLOL HCL 20 MG PO TABS: 20.0000 mg | ORAL_TABLET | Freq: Two times a day (BID) | ORAL | Status: DC

## 2013-01-05 MED ORDER — HALOPERIDOL 5 MG PO TABS
5.0000 mg | ORAL_TABLET | Freq: Two times a day (BID) | ORAL | Status: DC
  Administered 2013-01-05 – 2013-01-07 (×4): 5 mg via ORAL
  Filled 2013-01-05 (×6): qty 1

## 2013-01-05 MED ORDER — TRAMADOL HCL 50 MG PO TABS
50.0000 mg | ORAL_TABLET | Freq: Four times a day (QID) | ORAL | Status: DC | PRN
  Administered 2013-01-05 – 2013-01-06 (×3): 50 mg via ORAL
  Filled 2013-01-05 (×3): qty 1

## 2013-01-05 MED ORDER — HALOPERIDOL 5 MG PO TABS: 5.0000 mg | ORAL_TABLET | Freq: Two times a day (BID) | ORAL | Status: DC

## 2013-01-05 MED ORDER — MIDAZOLAM HCL 5 MG/ML IJ SOLN
INTRAMUSCULAR | Status: AC
  Filled 2013-01-05: qty 2

## 2013-01-05 MED ORDER — DIPHENHYDRAMINE HCL 50 MG/ML IJ SOLN
INTRAMUSCULAR | Status: DC | PRN
  Administered 2013-01-05: 25 mg via INTRAVENOUS

## 2013-01-05 MED ORDER — LACTULOSE 10 GM/15ML PO SOLN: 30.0000 g | Freq: Three times a day (TID) | ORAL | Status: DC

## 2013-01-05 MED ORDER — FENTANYL CITRATE 0.05 MG/ML IJ SOLN
INTRAMUSCULAR | Status: AC
  Filled 2013-01-05: qty 4

## 2013-01-05 MED ORDER — TRAMADOL HCL 50 MG PO TABS: 50.0000 mg | ORAL_TABLET | Freq: Four times a day (QID) | ORAL | Status: DC | PRN

## 2013-01-05 MED ORDER — ALPRAZOLAM 0.25 MG PO TABS: 1.0000 mg | ORAL_TABLET | Freq: Two times a day (BID) | ORAL | Status: DC | PRN

## 2013-01-05 MED ORDER — THIAMINE HCL 100 MG PO TABS: 100.0000 mg | ORAL_TABLET | Freq: Every day | ORAL | Status: DC

## 2013-01-05 MED ORDER — PANTOPRAZOLE SODIUM 40 MG PO TBEC: 40.0000 mg | DELAYED_RELEASE_TABLET | Freq: Every day | ORAL | Status: DC

## 2013-01-05 MED ORDER — MIDAZOLAM HCL 10 MG/2ML IJ SOLN
INTRAMUSCULAR | Status: DC | PRN
  Administered 2013-01-05 (×2): 2 mg via INTRAVENOUS

## 2013-01-05 NOTE — Progress Notes (Signed)
Pt able to call [facility] and speak with Winferd Humphrey [phone]). Tee spoke with pt about rules of house and gave permission for him to return by 01/06/13 if medically cleared by care team.   Delynn Flavin, RN

## 2013-01-05 NOTE — Op Note (Signed)
[name] [name] [facility] [address] [place], [address]   ENDOSCOPY PROCEDURE REPORT  PATIENT: [name], [name]  MR#: [id number] BIRTHDATE: 10/07/1960 , 51  yrs. old GENDER: Male ENDOSCOPIST:Megen Madewell Randa Evens, MD REFERRED BY:  Dr Donette Larry PROCEDURE DATE:  01/05/2013 PROCEDURE:      EGD ASA CLASS:     class IV INDICATIONS:   G.I. bleeding drop and hemoglobin MEDICATION: fentanyl 50 mcg, versed for milligrams, Benadryl 25 mg IV TOPICAL ANESTHETIC:    cetacaine spray  DESCRIPTION OF PROCEDURE:    The Pentax upper endoscope was inserted into the esophagus with deglutination. The esophagus was entered. There were small varices in the distal esophagus that flattened out their installation. There was no sign of recent variceal bleed ing. The stomach was entered and examined in the forward and retroflex view was basically normal. The duodenum including the bulb and 2nd portion was normal. There was no active bleeding throughout the G.I. track. The scope was withdrawn in the patient tolerated procedure well. Were no immediate complications.     COMPLICATIONS: None  ENDOSCOPIC IMPRESSION: 1. Small Esophageal Varices. They flatten out with INSOLATION OF AIR> Doubt that they have caused any bleeding  RECOMMENDATIONS:  1. would stop octreotide. Continue PPI therapy. Continue to encourage abstinence from alcohol. 2. If the patient continues to have Hemoccult positive stools, he may need colonoscopy inadequately recovered from his current illness.    _______________________________ Rosalie DoctorCarman Ching, MD 01/05/2013 1:04 PM  CC: Dr Donette Larry  PATIENT NAME:  [name], [name] MR#: [id number]

## 2013-01-05 NOTE — Progress Notes (Signed)
Subjective: Pt awake and alert Agitation last  Evening CT head negative.   Objective: Vital signs in last 24 hours: Temp:  [97.5 F (36.4 C)-98.4 F (36.9 C)] 98.4 F (36.9 C) (08/21 0400) Pulse Rate:  [68-84] 69 (08/21 0400) Resp:  [11-24] 11 (08/21 0400) BP: (121-156)/(77-97) 121/80 mmHg (08/21 0400) SpO2:  [95 %-98 %] 96 % (08/21 0400) Weight change:  Last BM Date: 01/04/13  Intake/Output from previous day: 08/20 0701 - 08/21 0700 In: 1457.5 [P.O.:970; I.V.:487.5] Out: 1604 [Urine:1602; Stool:2] Intake/Output this shift:    General appearance: alert Resp: clear to auscultation bilaterally Cardio: regular rate and rhythm GI: soft, non-tender; bowel sounds normal; no masses,  no organomegaly  Lab Results:  Recent Labs  01/04/13 1405 01/05/13 0340  WBC 3.8* 4.6  HGB 9.5* 9.9*  HCT 29.5* 31.6*  PLT 101* 124*   BMET  Recent Labs  01/03/13 2025 01/04/13 0320  NA 139 141  K 4.0 3.5  CL 106 108  CO2 20 21  GLUCOSE 129* 118*  BUN 16 14  CREATININE 0.80 0.71  CALCIUM 9.9 9.0    Studies/Results: Ct Head Wo Contrast  01/05/2013   *RADIOLOGY REPORT*  Clinical Data: Confusion.  CT HEAD WITHOUT CONTRAST  Technique:  Contiguous axial images were obtained from the base of the skull through the vertex without contrast.  Comparison: CT scan dated 09/26/2012  Findings: There is no acute intracranial hemorrhage, infarction, or mass lesion.  Brain parenchyma is normal.  Partial opacification of the sphenoid sinus is new since the prior study.  The other paranasal sinuses are clear except for a small retention cyst in the base of the right maxillary sinus.  IMPRESSION: No acute abnormalities of the brain.  Slight mucosal thickening in the left side of the sphenoid sinus is most likely chronic.   Original Report Authenticated By: Francene Boyers, M.D.    Medications: I have reviewed the patient's current medications.  Assessment/Plan: MS changes- combination of Benzo/  Hepatic Encephalopathy  Continue LActulose x3 time  t Ammonia level is good- MS improved  Cirrhosis:  BB/  Anemia/ low platlets- h/o Varies- EGD today- CBC stable- change PPI  Po; d/c octreotide  drip after EGD Back pain/ foot pain. Tylenol prn; Tramadol prn HTN- ok Bipoloar- continue Haldol at night/ prozac/ xanax prn PT consult Home vs return to [facility]- [facility]   LOS: 2 days   [name] [name] 01/05/2013, 7:52 AM

## 2013-01-05 NOTE — Interval H&P Note (Signed)
History and Physical Interval Note:  01/05/2013 12:34 PM  [name] [name]  has presented today for surgery, with the diagnosis of GI Bleed  The various methods of treatment have been discussed with the patient and family. After consideration of risks, benefits and other options for treatment, the patient has consented to  Procedure(s): ESOPHAGOGASTRODUODENOSCOPY (EGD) (N/A) as a surgical intervention .  The patient's history has been reviewed, patient examined, no change in status, stable for surgery.  I have reviewed the patient's chart and labs.  Questions were answered to the patient's satisfaction.     [name],Trell Secrist L

## 2013-01-05 NOTE — Progress Notes (Signed)
Patient had 1 incontinent stool,

## 2013-01-05 NOTE — Clinical Social Work Note (Signed)
CSW was notified by RN that pt was from half way house: [facility].  The contact there is [name] [name] [phone].  CSW was requested to contact half-way house re: possible return once the pt has been medically cleared for d/c.  [name] [name] states that pt would not be able to return if UDS was positive for Barbiturates.  CSW did not give any information to [place]; CSW only received information.  [name] [name] stated that pt would need to contact him ("the sooner the better") to re-commit to the rules of the house before [name] [name] would consent to his return. This information was passed along to the RN who agreed to notify MD.  Side note: Waiting lists for residential treatment facilities such as [facility] is any where from 3 weeks to 1 year.    CSW will continue to follow and assist as necessary.  [name] [name], LCSWA [phone]  Clinical Social Work

## 2013-01-05 NOTE — Progress Notes (Signed)
PT Cancellation Note  Patient Details Name: [name] [name] MRN: [id number] DOB: 24-Feb-1961   Cancelled Treatment:    Reason Eval/Treat Not Completed: Patient at procedure or test/unavailable.  Pt currently off floor.  Will continue to follow.   Nevia Henkin 01/05/2013, 11:26 AM  Jake Shark, PT DPT [phone]

## 2013-01-05 NOTE — Clinical Social Work Psych Assess (Signed)
Clinical Social Work Department BRIEF PSYCHOSOCIAL ASSESSMENT 01/05/2013  Patient:  [name] [name],[name] [name]     Account Number:  [id number]     Admit date:  01/03/2013  Clinical Social Worker:  Read Drivers  Date/Time:  01/05/2013 02:28 PM  Referred by:  Physician  Date Referred:  01/05/2013 Referred for  Psychosocial assessment   Other Referral:   possible overdose UDS positive for opiates and benzos   Interview type:  Other - See comment Other interview type:   Winferd Humphrey with [facility] way house    PSYCHOSOCIAL DATA Living Status:  OTHER Admitted from facility:   Level of care:   Primary support name:  Clydie Braun Primary support relationship to patient:  SPOUSE Degree of support available:   adquate    CURRENT CONCERNS Current Concerns  Substance Abuse  Post-Acute Placement   Other Concerns:   none    SOCIAL WORK ASSESSMENT / PLAN CSW contacted Winferd Humphrey per pt request.  Pt presents to [facility] with possible overdose.  UDS was positive for opiates and benzos.  Pt is from a half-way house called [facility].  The contact there is Winferd Humphrey and he can be reached at [phone].  Rhae Hammock states that the pt would need to contact him as soon as possible re: re-commitment to the house rules before Rhae Hammock would allow the pt to return.  Rhae Hammock has CSW phone number and will contact CSW once he has spoken to the pt in re: return.   Assessment/plan status:  Psychosocial Support/Ongoing Assessment of Needs Other assessment/ plan:     none   Information/referral to community resources:   [facility]    PATIENT'S/FAMILY'S RESPONSE TO PLAN OF CARE: Pt and Winferd Humphrey were both appreciative of CSW assistance and support.       Vickii Penna, LCSWA [phone]  Clinical Social Work

## 2013-01-05 NOTE — H&P (View-Only) (Signed)
[facility] CONSULT Reason for consult:? GI Bleed  Referring Physician: Dr Husain  [name] [name] is an 52 y.o. male.  HPI: patient has a history about all use. He reports he has had no alcohol since 2011. In 2011 he had dropped and hemoglobin and had EGD showing varices. He has been hospitalized for acute confusion and had a drop and hemoglobin to 9.1 from his baseline hemoglobin which is reported to have been normal. 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 This tool is positive for occult blood. I was felt that he needed EGD to evaluate for source of his bleeding. He currently is receiving octreotide empirically   Past Medical History  Diagnosis Date  . Thrombocytopenia   . Cirrhosis   . Hepatitis C   . Hypertension   . Anxiety   . Anemia   . Blood transfusion   . Obesity   . Pancreatitis   . Bipolar disorder, unspecified 09/27/2012  . Depression 09/27/2012    Past Surgical History  Procedure Laterality Date  . Tonsillectomy      Family History  Problem Relation Age of Onset  . Cancer - Other Mother     Uterine  . Coronary artery disease Father   . Coronary artery disease Paternal Grandfather   . Hypertension Father   . Hypertension Paternal Grandfather     Social History:  reports that he quit smoking about 9 years ago. His smoking use included Cigarettes. He smoked 0.00 packs per day. He does not have any smokeless tobacco history on file. He reports that  drinks alcohol. He reports that he uses illicit drugs (Cocaine, Hydrocodone, and Benzodiazepines).  Allergies:  Allergies  Allergen Reactions  . Trileptal [Oxcarbazepine] Other (See Comments)    Sleep walking & hallucinations    Medications; . amantadine  100 mg Oral TID  . FLUoxetine  20 mg Oral Daily  . haloperidol  5 mg Oral QHS  . lactulose  30 g Oral TID  . octreotide  50 mcg Intravenous Once  . [START ON 01/07/2013] pantoprazole (PROTONIX) IV  40 mg Intravenous  Q12H  . propranolol  20 mg Oral BID  . sodium chloride  3 mL Intravenous Q12H  . sodium chloride  3 mL Intravenous Q12H  . thiamine  100 mg Oral Daily   PRN Meds acetaminophen, acetaminophen, hydrALAZINE, ondansetron (ZOFRAN) IV, ondansetron Results for orders placed during the hospital encounter of 01/03/13 (from the past 48 hour(s))  URINALYSIS, ROUTINE W REFLEX MICROSCOPIC     Status: Abnormal   Collection Time    01/03/13  7:45 PM      Result Value Range   Color, Urine AMBER (*) YELLOW   Comment: BIOCHEMICALS MAY BE AFFECTED BY COLOR   APPearance CLEAR  CLEAR   Specific Gravity, Urine 1.030  1.005 - 1.030   pH 5.5  5.0 - 8.0   Glucose, UA NEGATIVE  NEGATIVE mg/dL   Hgb urine dipstick NEGATIVE  NEGATIVE   Bilirubin Urine NEGATIVE  NEGATIVE   Ketones, ur NEGATIVE  NEGATIVE mg/dL   Protein, ur 30 (*) NEGATIVE mg/dL   Urobilinogen, UA 1.0  0.0 - 1.0 mg/dL   Nitrite NEGATIVE  NEGATIVE   Leukocytes, UA NEGATIVE  NEGATIVE  URINE RAPID DRUG SCREEN (HOSP PERFORMED)     Status: Abnormal   Collection Time    01/03/13  7:45 PM      Result Value Range   Opiates POSITIVE (*) NONE DETECTED     Cocaine NONE DETECTED  NONE DETECTED   Benzodiazepines POSITIVE (*) NONE DETECTED   Amphetamines NONE DETECTED  NONE DETECTED   Tetrahydrocannabinol NONE DETECTED  NONE DETECTED   Barbiturates NONE DETECTED  NONE DETECTED   Comment:            DRUG SCREEN FOR MEDICAL PURPOSES     ONLY.  IF CONFIRMATION IS NEEDED     FOR ANY PURPOSE, NOTIFY LAB     WITHIN 5 DAYS.                LOWEST DETECTABLE LIMITS     FOR URINE DRUG SCREEN     Drug Class       Cutoff (ng/mL)     Amphetamine      1000     Barbiturate      200     Benzodiazepine   200     Tricyclics       300     Opiates          300     Cocaine          300     THC              50  URINE MICROSCOPIC-ADD ON     Status: Abnormal   Collection Time    01/03/13  7:45 PM      Result Value Range   Squamous Epithelial / LPF RARE  RARE    WBC, UA 0-2  <3 WBC/hpf   RBC / HPF 0-2  <3 RBC/hpf   Bacteria, UA FEW (*) RARE   Casts HYALINE CASTS (*) NEGATIVE   Urine-Other MUCOUS PRESENT    CBC WITH DIFFERENTIAL     Status: Abnormal   Collection Time    01/03/13  8:25 PM      Result Value Range   WBC 4.2  4.0 - 10.5 K/uL   RBC 3.67 (*) 4.22 - 5.81 MIL/uL   Hemoglobin 9.3 (*) 13.0 - 17.0 g/dL   HCT 29.1 (*) 39.0 - 52.0 %   MCV 79.3  78.0 - 100.0 fL   MCH 25.3 (*) 26.0 - 34.0 pg   MCHC 32.0  30.0 - 36.0 g/dL   RDW 16.0 (*) 11.5 - 15.5 %   Platelets 113 (*) 150 - 400 K/uL   Comment: PLATELET COUNT CONFIRMED BY SMEAR   Neutrophils Relative % 66  43 - 77 %   Lymphocytes Relative 12  12 - 46 %   Monocytes Relative 13 (*) 3 - 12 %   Eosinophils Relative 7 (*) 0 - 5 %   Basophils Relative 2 (*) 0 - 1 %   Neutro Abs 2.8  1.7 - 7.7 K/uL   Lymphs Abs 0.5 (*) 0.7 - 4.0 K/uL   Monocytes Absolute 0.5  0.1 - 1.0 K/uL   Eosinophils Absolute 0.3  0.0 - 0.7 K/uL   Basophils Absolute 0.1  0.0 - 0.1 K/uL   RBC Morphology ELLIPTOCYTES     Smear Review LARGE PLATELETS PRESENT     Comment: PLATELETS APPEAR ADEQUATE     PLATELET COUNT CONFIRMED BY SMEAR  COMPREHENSIVE METABOLIC PANEL     Status: Abnormal   Collection Time    01/03/13  8:25 PM      Result Value Range   Sodium 139  135 - 145 mEq/L   Potassium 4.0  3.5 - 5.1 mEq/L   Chloride 106  96 - 112 mEq/L   CO2 20    19 - 32 mEq/L   Glucose, Bld 129 (*) 70 - 99 mg/dL   BUN 16  6 - 23 mg/dL   Creatinine, Ser 0.80  0.50 - 1.35 mg/dL   Calcium 9.9  8.4 - 10.5 mg/dL   Total Protein 6.8  6.0 - 8.3 g/dL   Albumin 3.4 (*) 3.5 - 5.2 g/dL   AST 43 (*) 0 - 37 U/L   ALT 27  0 - 53 U/L   Alkaline Phosphatase 90  39 - 117 U/L   Total Bilirubin 0.6  0.3 - 1.2 mg/dL   GFR calc non Af Amer >90  >90 mL/min   GFR calc Af Amer >90  >90 mL/min   Comment: (NOTE)     The eGFR has been calculated using the CKD EPI equation.     This calculation has not been validated in all clinical situations.      eGFR's persistently <90 mL/min signify possible Chronic Kidney     Disease.  ETHANOL     Status: None   Collection Time    01/03/13  8:25 PM      Result Value Range   Alcohol, Ethyl (B) <11  0 - 11 mg/dL   Comment:            LOWEST DETECTABLE LIMIT FOR     SERUM ALCOHOL IS 11 mg/dL     FOR MEDICAL PURPOSES ONLY     REPEATED TO VERIFY  ACETAMINOPHEN LEVEL     Status: None   Collection Time    01/03/13  8:25 PM      Result Value Range   Acetaminophen (Tylenol), Serum <15.0  10 - 30 ug/mL   Comment:            THERAPEUTIC CONCENTRATIONS VARY     SIGNIFICANTLY. A RANGE OF 10-30     ug/mL MAY BE AN EFFECTIVE     CONCENTRATION FOR MANY PATIENTS.     HOWEVER, SOME ARE BEST TREATED     AT CONCENTRATIONS OUTSIDE THIS     RANGE.     ACETAMINOPHEN CONCENTRATIONS     >150 ug/mL AT 4 HOURS AFTER     INGESTION AND >50 ug/mL AT 12     HOURS AFTER INGESTION ARE     OFTEN ASSOCIATED WITH TOXIC     REACTIONS.     REPEATED TO VERIFY  SALICYLATE LEVEL     Status: Abnormal   Collection Time    01/03/13  8:25 PM      Result Value Range   Salicylate Lvl <2.0 (*) 2.8 - 20.0 mg/dL   Comment: REPEATED TO VERIFY  AMMONIA     Status: Abnormal   Collection Time    01/03/13  8:25 PM      Result Value Range   Ammonia 88 (*) 11 - 60 umol/L  OCCULT BLOOD X 1 CARD TO LAB, STOOL     Status: Abnormal   Collection Time    01/03/13 11:00 PM      Result Value Range   Fecal Occult Bld POSITIVE (*) NEGATIVE  MRSA PCR SCREENING     Status: None   Collection Time    01/04/13  1:37 AM      Result Value Range   MRSA by PCR NEGATIVE  NEGATIVE   Comment:            The GeneXpert MRSA Assay (FDA     approved for NASAL specimens     only), is one   component of a     comprehensive MRSA colonization     surveillance program. It is not     intended to diagnose MRSA     infection nor to guide or     monitor treatment for     MRSA infections.  CBC     Status: Abnormal   Collection Time    01/04/13  3:20  AM      Result Value Range   WBC 3.9 (*) 4.0 - 10.5 K/uL   RBC 3.69 (*) 4.22 - 5.81 MIL/uL   Hemoglobin 9.1 (*) 13.0 - 17.0 g/dL   HCT 28.3 (*) 39.0 - 52.0 %   MCV 76.7 (*) 78.0 - 100.0 fL   MCH 24.7 (*) 26.0 - 34.0 pg   MCHC 32.2  30.0 - 36.0 g/dL   RDW 16.1 (*) 11.5 - 15.5 %   Platelets 92 (*) 150 - 400 K/uL   Comment: PLATELET COUNT CONFIRMED BY SMEAR     REPEATED TO VERIFY  COMPREHENSIVE METABOLIC PANEL     Status: Abnormal   Collection Time    01/04/13  3:20 AM      Result Value Range   Sodium 141  135 - 145 mEq/L   Potassium 3.5  3.5 - 5.1 mEq/L   Chloride 108  96 - 112 mEq/L   CO2 21  19 - 32 mEq/L   Glucose, Bld 118 (*) 70 - 99 mg/dL   BUN 14  6 - 23 mg/dL   Creatinine, Ser 0.71  0.50 - 1.35 mg/dL   Calcium 9.0  8.4 - 10.5 mg/dL   Total Protein 6.3  6.0 - 8.3 g/dL   Albumin 3.2 (*) 3.5 - 5.2 g/dL   AST 47 (*) 0 - 37 U/L   ALT 27  0 - 53 U/L   Alkaline Phosphatase 71  39 - 117 U/L   Total Bilirubin 0.7  0.3 - 1.2 mg/dL   GFR calc non Af Amer >90  >90 mL/min   GFR calc Af Amer >90  >90 mL/min   Comment: (NOTE)     The eGFR has been calculated using the CKD EPI equation.     This calculation has not been validated in all clinical situations.     eGFR's persistently <90 mL/min signify possible Chronic Kidney     Disease.  AMMONIA     Status: Abnormal   Collection Time    01/04/13  4:00 AM      Result Value Range   Ammonia 77 (*) 11 - 60 umol/L  GLUCOSE, CAPILLARY     Status: Abnormal   Collection Time    01/04/13  5:56 AM      Result Value Range   Glucose-Capillary 116 (*) 70 - 99 mg/dL  AMMONIA     Status: None   Collection Time    01/04/13  8:36 AM      Result Value Range   Ammonia 48  11 - 60 umol/L    No results found.             Blood pressure 131/80, pulse 75, temperature 97.6 F (36.4 C), temperature source Oral, resp. rate 24, height 6' 4" (1.93 m), weight 143.3 kg (315 lb 14.7 oz), SpO2 97.00%.  Physical exam:   General--alert male  who appears somewhat groggy but is oriented  Heart--regular rate and rhythm without murmurs are gallops  Lungs--clear  Abdomen--obese nontender. No gross ascites   Assessment: 1. Acute mental status changes.   Suspect this is probably from Xanax rather than hepatic encephalopathy. According to the labs, his hepatic function is reasonably good and he has had no alcohol or share years. 2. 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 He has had no alcohol now for 3 years 3. Gallstones. Present on prior ultrasound  Plan: Go ahead and range selected EGD tomorrow. Have discussed this with the patient is agreeable. Continue protonix and octreotide for now.   Kassi Esteve JR,Salima Rumer L 01/04/2013, 2:09 PM      

## 2013-01-06 ENCOUNTER — Encounter (HOSPITAL_COMMUNITY): Payer: Self-pay | Admitting: Gastroenterology

## 2013-01-06 LAB — GLUCOSE, CAPILLARY: Glucose-Capillary: 135 mg/dL — ABNORMAL HIGH (ref 70–99)

## 2013-01-06 MED ORDER — TRAMADOL HCL 50 MG PO TABS: 50.0000 mg | ORAL_TABLET | Freq: Four times a day (QID) | ORAL | Status: DC | PRN

## 2013-01-06 MED ORDER — PANTOPRAZOLE SODIUM 40 MG PO TBEC: 40.0000 mg | DELAYED_RELEASE_TABLET | Freq: Every day | ORAL | Status: DC

## 2013-01-06 MED ORDER — PROPRANOLOL HCL 20 MG PO TABS: 20.0000 mg | ORAL_TABLET | Freq: Two times a day (BID) | ORAL | Status: DC

## 2013-01-06 MED ORDER — THIAMINE HCL 100 MG PO TABS: 100.0000 mg | ORAL_TABLET | Freq: Every day | ORAL | Status: DC

## 2013-01-06 MED ORDER — HALOPERIDOL 5 MG PO TABS: 5.0000 mg | ORAL_TABLET | Freq: Two times a day (BID) | ORAL | Status: DC

## 2013-01-06 NOTE — Progress Notes (Signed)
Subjective: Pt close to baseline Awake  Less confusion  Objective: Vital signs in last 24 hours: Temp:  [97.8 F (36.6 C)-98.7 F (37.1 C)] 98 F (36.7 C) (08/22 0005) Pulse Rate:  [67-82] 67 (08/22 0350) Resp:  [14-22] 18 (08/22 0350) BP: (112-143)/(57-96) 121/79 mmHg (08/22 0350) SpO2:  [92 %-100 %] 95 % (08/22 0350) Weight change:  Last BM Date: 01/05/13  Intake/Output from previous day: 08/21 0701 - 08/22 0700 In: 670 [P.O.:280; I.V.:390] Out: 2225 [Urine:2225] Intake/Output this shift:    General appearance: alert Resp: clear to auscultation bilaterally GI: soft, non-tender; bowel sounds normal; no masses,  no organomegaly  Lab Results:  Recent Labs  01/04/13 1405 01/05/13 0340  WBC 3.8* 4.6  HGB 9.5* 9.9*  HCT 29.5* 31.6*  PLT 101* 124*   BMET  Recent Labs  01/03/13 2025 01/04/13 0320  NA 139 141  K 4.0 3.5  CL 106 108  CO2 20 21  GLUCOSE 129* 118*  BUN 16 14  CREATININE 0.80 0.71  CALCIUM 9.9 9.0    Studies/Results: Ct Head Wo Contrast  01/05/2013   *RADIOLOGY REPORT*  Clinical Data: Confusion.  CT HEAD WITHOUT CONTRAST  Technique:  Contiguous axial images were obtained from the base of the skull through the vertex without contrast.  Comparison: CT scan dated 09/26/2012  Findings: There is no acute intracranial hemorrhage, infarction, or mass lesion.  Brain parenchyma is normal.  Partial opacification of the sphenoid sinus is new since the prior study.  The other paranasal sinuses are clear except for a small retention cyst in the base of the right maxillary sinus.  IMPRESSION: No acute abnormalities of the brain.  Slight mucosal thickening in the left side of the sphenoid sinus is most likely chronic.   Original Report Authenticated By: Francene Boyers, M.D.    Medications: I have reviewed the patient's current medications.  Assessment/Plan: MS changes- combination of Benzo/ Hepatic Encephalopathy - improved Continue LActulose x3 time  t Ammonia  level is good- MS improved  Cirrhosis: BB/  Anemia/ low platlets- h/o Varies- EGD - ok- no bleed grade I varies. CBC stable-  Back pain/ foot pain. Tylenol prn; Tramadol prn  HTN- ok  Bipoloar- continue Haldol bid; avoid benzo Watch today Ok to d/c to [facility] tomorrow    LOS: 3 days   Hafsah Hendler 01/06/2013, 7:43 AM

## 2013-01-06 NOTE — Evaluation (Signed)
Physical Therapy Evaluation Patient Details Name: [name] [name] MRN: [id number] DOB: Feb 19, 1961 Today's Date: 01/06/2013 Time: 1191-4782 PT Time Calculation (min): 21 min  PT Assessment / Plan / Recommendation History of Present Illness  [name] [name] is a 52 y.o. male history of hepatitis C, cirrhosis of liver, bipolar disorder and hypertension who's presently in recovery house was taken to his home yesterday by his wife as his dog was dying. Patient has been in the recovery house and being detoxed off Xanax abuse. As per wife patient has not been on Xanax for last 2 months. Yesterday patient wife feels that he may have stolen her Xanax and taken few of them. Patient's wife states that 20 tablets of 1 mg Xanax were missing from her bottle. Patient later was found to be very confused and thrashing around the house and was brought to the ER. Urine drug screen was positive for benzodiazepine and opiates. Patient's labs also show an elevated ammonia levels and in the ER physician questioned if he was taking lactulose patient states that because he has had frequent diarrhea he stopped taking them as advised. Patient in addition was found to be anemic with hemoglobin around 9 in the last one previously was on 13. Stool for occult blood was positive. Patient has been admitted for further management. Presently patient is oriented to his name but still confused.  Clinical Impression  Pt admitted with the above. Pt currently with functional limitations due to the deficits listed below (see PT Problem List). Pt lethargic when entering room and pt quickly will return to sleeping when not spoken to.  However pt very agreeable to work with therapy and able to ambulate this session.  Plan to practice stair negotiation next session Pt will benefit from skilled PT to increase their independence and safety with mobility to allow discharge to the venue listed below.  Do not anticipate any PT needs with continue to  progression.     PT Assessment  Patient needs continued PT services    Follow Up Recommendations  Supervision - Intermittent;No PT follow up    Equipment Recommendations  None recommended by PT    Frequency Min 3X/week    Precautions / Restrictions Precautions Precautions: Fall   Pertinent Vitals/Pain No c/o pain; vitals maintained      Mobility  Bed Mobility Bed Mobility: Supine to Sit Supine to Sit: 6: Modified independent (Device/Increase time) Details for Bed Mobility Assistance: Needs extra time Transfers Transfers: Sit to Stand;Stand to Sit Sit to Stand: 4: Min guard;From bed Stand to Sit: 4: Min guard;To chair/3-in-1 Details for Transfer Assistance: Minguard for safety with slow initiation Ambulation/Gait Ambulation/Gait Assistance: 4: Min guard Ambulation Distance (Feet): 100 Feet Assistive device: None Ambulation/Gait Assistance Details: Minguard for safety for safety due to slow and shuffle gait Gait Pattern: Step-to pattern;Decreased stride length;Shuffle;Wide base of support Gait velocity: decreased Stairs: No    Exercises     PT Diagnosis: Difficulty walking;Generalized weakness  PT Problem List: Decreased activity tolerance;Decreased balance;Decreased mobility;Decreased cognition PT Treatment Interventions: DME instruction;Gait training;Stair training;Functional mobility training;Therapeutic activities;Balance training;Therapeutic exercise;Cognitive remediation;Patient/family education     PT Goals(Current goals can be found in the care plan section) Acute Rehab PT Goals Patient Stated Goal: Did not set PT Goal Formulation: With patient Time For Goal Achievement: 01/13/13 Potential to Achieve Goals: Good  Visit Information  Last PT Received On: 01/06/13 History of Present Illness: [name] [name] is a 52 y.o. male history of hepatitis C, cirrhosis of liver, bipolar disorder and  hypertension who's presently in recovery house was taken to his home  yesterday by his wife as his dog was dying. Patient has been in the recovery house and being detoxed off Xanax abuse. As per wife patient has not been on Xanax for last 2 months. Yesterday patient wife feels that he may have stolen her Xanax and taken few of them. Patient's wife states that 20 tablets of 1 mg Xanax were missing from her bottle. Patient later was found to be very confused and thrashing around the house and was brought to the ER. Urine drug screen was positive for benzodiazepine and opiates. Patient's labs also show an elevated ammonia levels and in the ER physician questioned if he was taking lactulose patient states that because he has had frequent diarrhea he stopped taking them as advised. Patient in addition was found to be anemic with hemoglobin around 9 in the last one previously was on 13. Stool for occult blood was positive. Patient has been admitted for further management. Presently patient is oriented to his name but still confused.       Prior Functioning  Home Living Family/patient expects to be discharged to:: Other (Comment) Living Arrangements: Spouse/significant other Available Help at Discharge: Friend(s);Available PRN/intermittently (Returning to rehab home for detox) Type of Home: House Home Access: Stairs to enter [employer] of Steps: 3 Home Layout: Two level Alternate Level Stairs-Number of Steps: 14 Alternate Level Stairs-Rails: Right;Left;Can reach both Home Equipment: None Additional Comments: [facility]" Prior Function Level of Independence: Independent Comments: Walks to grocery store, otherwise takes bus. Communication Communication: No difficulties Dominant Hand: Right    Cognition  Cognition Arousal/Alertness: Awake/alert (easily falls a sleep when not spoken too) Behavior During Therapy: Flat affect Overall Cognitive Status: Impaired/Different from baseline Area of Impairment: Problem solving Problem  Solving: Slow processing;Decreased initiation;Requires verbal cues General Comments: Pt overall slow to process with slow speech    Extremity/Trunk Assessment Lower Extremity Assessment Lower Extremity Assessment: Overall WFL for tasks assessed   Balance Balance Balance Assessed: Yes Static Sitting Balance Static Sitting - Balance Support: Feet supported Static Sitting - Level of Assistance: 6: Modified independent (Device/Increase time)  End of Session PT - End of Session Equipment Utilized During Treatment: Gait belt Activity Tolerance: Patient tolerated treatment well Patient left: in chair;with call bell/phone within reach Nurse Communication: Mobility status  GP     Ahlayah Tarkowski 01/06/2013, 10:39 AM   Jake Shark, PT DPT [phone]

## 2013-01-06 NOTE — Clinical Social Work Note (Signed)
CSW was provided updated information re: disposition by RN [place] (documented 01/05/2013 3:42pm).  Pt is eligible to return to [facility] ([facility]) upon d/c.  CSW signing off.  Please re-consult as necessary.  Vickii Penna, LCSWA [phone]  Clinical Social Work

## 2013-01-06 NOTE — Discharge Summary (Signed)
Physician Discharge Summary  Patient ID: [name] [name] MRN: [id number] DOB/AGE: Oct 29, 1960 52 y.o.  Admit date: 01/03/2013 Discharge date: 01/06/2013  Admission Diagnoses:  Discharge Diagnoses:  Active Problems: Encephalopathy-Combination of benzodiazapine and Hepatic Endoscopy- grade 1 varices without any bleeding Anemia- hemoglobin stable at 9.9   THROMBOCYTOPENIA-Stable   CIRRHOSIS-Liver tests stable Bipolar depresion Polysubstance abuse   Acute encephalopathy   Anemia   GI bleed   Discharged Condition: good  Hospital Course: 52 years old male with known history of liver cirrhosis, with depression with polysubstance abuse a bipolar; history of alcohol abuse in the past-Presented with Mental status changes Problem #1: Encephalopathy:  Patient reportedly Taken 6 Xanax, with underlying history of hepatic encephalopathy.-Ammonia level ws mildly elevated at 88;Other drug screens including alcohol level, Tylenol level, barbiturates levels were negative.CT of the head neative His lactulose was increased 3 times a day with improvement in ammonia level back to normal GI was consulted- because of guaiac positive stool and mild anemia with underlying history of esophageal varices- he underwent an endoscopy- it showed grade 1 varices without any evidence of bleeding. Patient was started on Inderal. Continue on PPI- Protonix His liver enzymes were normal. Renal function noma. Platelet Stable. The hemoglbin stable at 9. 9 Problem #2: hypertension lisinopril discontinue continue Inderal oral;  blood pressure Stable. Problem #3: major depression, polysubstance abuse, bipolar depression- Haldol increased bi.d.a Avoid benzodiazepines Probable for back pain and leg pain:Tylenol p.r.n. Tramadol p.r.n..  Consults: GI  Significant Diagnostic Studies: labs: Blood chemistries normal, hemoglobin 9.9. Platelet count stable. Ammonia level normal. Urinalysis normal. and radiology: CXR: normal;CT of the  head negaive ; Treatments: IV hydration  Discharge Exam: Blood pressure 131/77, pulse 73, temperature 97.7 F (36.5 C), temperature source Axillary, resp. rate 13, height 6\' 4"  (1.93 m), weight 143.3 kg (315 lb 14.7 oz), SpO2 95.00%. General appearance: alert Resp: clear to auscultation bilaterally GI: soft, non-tender; bowel sounds normal; no masses,  no organomegaly Neurologic: Grossly normal  Disposition: Half way  Home-     Medication List    STOP taking these medications       ALPRAZolam 1 MG tablet  Commonly known as:  XANAX     lisinopril 20 MG tablet  Commonly known as:  PRINIVIL      TAKE these medications       acetaminophen 325 MG tablet  Commonly known as:  TYLENOL  Take 650 mg by mouth every 6 (six) hours as needed for pain. For pain     amantadine 100 MG capsule  Commonly known as:  SYMMETREL  Take 1 capsule (100 mg total) by mouth 3 (three) times daily.     FLUoxetine 20 MG capsule  Commonly known as:  PROZAC  Take 20 mg by mouth daily.     haloperidol 5 MG tablet  Commonly known as:  HALDOL  Take 1 tablet (5 mg total) by mouth 2 (two) times daily. For mood control     lactulose 10 GM/15ML solution  Commonly known as:  CHRONULAC  Take 45 mLs (30 g total) by mouth 3 (three) times daily. For constipation     pantoprazole 40 MG tablet  Commonly known as:  PROTONIX  Take 1 tablet (40 mg total) by mouth daily.     propranolol 20 MG tablet  Commonly known as:  INDERAL  Take 1 tablet (20 mg total) by mouth 2 (two) times daily.     thiamine 100 MG tablet  Take 1 tablet (100 mg total)  by mouth daily.     traMADol 50 MG tablet  Commonly known as:  ULTRAM  Take 1 tablet (50 mg total) by mouth every 6 (six) hours as needed.         SignedGeorgann Housekeeper 01/06/2013, 8:05 AM

## 2013-01-07 LAB — GLUCOSE, CAPILLARY
Glucose-Capillary: 145 mg/dL — ABNORMAL HIGH (ref 70–99)
Glucose-Capillary: 153 mg/dL — ABNORMAL HIGH (ref 70–99)

## 2013-01-07 NOTE — Progress Notes (Signed)
Patient discharged to [facility] ( [facility]) picked up from hospital by [facility] [place]  [phone]

## 2013-01-07 NOTE — Progress Notes (Signed)
Subjective: [name] [name] is feeling well today.  Alert and oriented.  Ready for discharge.  Progressing with physical therapy well unable to manage steps et Karie Soda.  We'll be heading back to his halfway house today and apparently can be picked up by an assistant, [name] [name]. [name] [name]  Objective: Weight change:   Intake/Output Summary (Last 24 hours) at 01/07/13 1310 Last data filed at 01/07/13 0828  Gross per 24 hour  Intake    480 ml  Output    500 ml  Net    -20 ml   Filed Vitals:   01/07/13 0400 01/07/13 0600 01/07/13 0804 01/07/13 1000  BP: 100/60 115/77 122/78 126/79  Pulse: 72 70 68 67  Temp:   98 F (36.7 C)   TempSrc:   Oral   Resp: 19 15 20    Height:      Weight:      SpO2: 92% 92% 90% 93%   General appearance: alert  Resp: clear to auscultation bilaterally  GI: soft, non-tender; bowel sounds normal; no masses, no organomegaly  Neurologic: Grossly normal   Lab Results: Results for orders placed during the hospital encounter of 01/03/13 (from the past 48 hour(s))  GLUCOSE, CAPILLARY     Status: Abnormal   Collection Time    01/05/13  2:38 PM      Result Value Range   Glucose-Capillary 118 (*) 70 - 99 mg/dL  GLUCOSE, CAPILLARY     Status: Abnormal   Collection Time    01/05/13  6:22 PM      Result Value Range   Glucose-Capillary 110 (*) 70 - 99 mg/dL   Comment 1 Notify RN    GLUCOSE, CAPILLARY     Status: Abnormal   Collection Time    01/05/13  9:24 PM      Result Value Range   Glucose-Capillary 106 (*) 70 - 99 mg/dL  GLUCOSE, CAPILLARY     Status: Abnormal   Collection Time    01/06/13 12:34 AM      Result Value Range   Glucose-Capillary 118 (*) 70 - 99 mg/dL  GLUCOSE, CAPILLARY     Status: Abnormal   Collection Time    01/06/13  5:59 AM      Result Value Range   Glucose-Capillary 109 (*) 70 - 99 mg/dL  GLUCOSE, CAPILLARY     Status: Abnormal   Collection Time    01/06/13 12:15 PM      Result Value Range   Glucose-Capillary 126 (*) 70 - 99 mg/dL   Comment  1 Notify RN    GLUCOSE, CAPILLARY     Status: Abnormal   Collection Time    01/06/13  6:01 PM      Result Value Range   Glucose-Capillary 135 (*) 70 - 99 mg/dL   Comment 1 Notify RN    GLUCOSE, CAPILLARY     Status: Abnormal   Collection Time    01/06/13 11:53 PM      Result Value Range   Glucose-Capillary 145 (*) 70 - 99 mg/dL  GLUCOSE, CAPILLARY     Status: Abnormal   Collection Time    01/07/13 12:32 PM      Result Value Range   Glucose-Capillary 153 (*) 70 - 99 mg/dL    Studies/Results: No results found. Medications: Scheduled Meds: . amantadine  100 mg Oral TID  . FLUoxetine  20 mg Oral Daily  . haloperidol  5 mg Oral BID  . lactulose  30 g Oral TID  .  pantoprazole  40 mg Oral Daily  . propranolol  20 mg Oral BID  . sodium chloride  3 mL Intravenous Q12H  . sodium chloride  3 mL Intravenous Q12H  . thiamine  100 mg Oral Daily   Continuous Infusions:  PRN Meds:.acetaminophen, acetaminophen, hydrALAZINE, HYDROmorphone (DILAUDID) injection, ondansetron (ZOFRAN) IV, ondansetron, traMADol  Assessment/Plan:  Admit date: 01/03/2013  Discharge date: 01/06/2013  Admission Diagnoses:  Discharge Diagnoses:  Active Problems:  Encephalopathy-Combination of benzodiazapine and Hepatic  Endoscopy- grade 1 varices without any bleeding  Anemia- hemoglobin stable at 9.9  THROMBOCYTOPENIA-Stable  CIRRHOSIS-Liver tests stable  Bipolar depresion  Polysubstance abuse  Acute encephalopathy  Anemia  GI bleed  Discharged Condition: good  Hospital Course: 52 years old male with known history of liver cirrhosis, with depression with polysubstance abuse a bipolar; history of alcohol abuse in the past-Presented with Mental status changes  Problem #1: Encephalopathy: Patient reportedly Taken 6 Xanax, with underlying history of hepatic encephalopathy.-Ammonia level ws mildly elevated at 88;Other drug screens including alcohol level, Tylenol level, barbiturates levels were negative.CT of the  head neative  His lactulose was increased 3 times a day with improvement in ammonia level back to normal  GI was consulted- because of guaiac positive stool and mild anemia with underlying history of esophageal varices- he underwent an endoscopy- it showed grade 1 varices without any evidence of bleeding. Patient was started on Inderal. Continue on PPI- Protonix  His liver enzymes were normal. Renal function noma. Platelet Stable. The hemoglbin stable at 9. 9  Problem #2: hypertension lisinopril discontinue continue Inderal oral; blood pressure Stable.  Problem #3: major depression, polysubstance abuse, bipolar depression- Haldol increased bi.d.a  Avoid benzodiazepines  Probable for back pain and leg pain:Tylenol p.r.n. Tramadol p.r.n..  Consults: GI  Significant Diagnostic Studies: labs: Blood chemistries normal, hemoglobin 9.9. Platelet count stable. Ammonia level normal. Urinalysis normal. and radiology: CXR: normal;CT of the head negaive  ;  Treatments: IV hydration    LOS: 4 days   Pearla Dubonnet, MD 01/07/2013, 1:10 PM

## 2013-01-07 NOTE — Progress Notes (Signed)
Physical Therapy Treatment Patient Details Name: [name] [name] MRN: [id number] DOB: 10-07-1960 Today's Date: 01/07/2013 Time: 6295-2841 PT Time Calculation (min): 23 min  PT Assessment / Plan / Recommendation  History of Present Illness [name] [name] is a 52 y.o. male history of hepatitis C, cirrhosis of liver, bipolar disorder and hypertension who's presently in recovery house was taken to his home yesterday by his wife as his dog was dying. Patient has been in the recovery house and being detoxed off Xanax abuse. As per wife patient has not been on Xanax for last 2 months. Yesterday patient wife feels that he may have stolen her Xanax and taken few of them. Patient's wife states that 20 tablets of 1 mg Xanax were missing from her bottle. Patient later was found to be very confused and thrashing around the house and was brought to the ER. Urine drug screen was positive for benzodiazepine and opiates. Patient's labs also show an elevated ammonia levels and in the ER physician questioned if he was taking lactulose patient states that because he has had frequent diarrhea he stopped taking them as advised. Patient in addition was found to be anemic with hemoglobin around 9 in the last one previously was on 13. Stool for occult blood was positive. Patient has been admitted for further management. Presently patient is oriented to his name but still confused.   PT Comments   Pt making great progress and able to complete stair negotiation.    Follow Up Recommendations  Supervision - Intermittent;No PT follow up     Equipment Recommendations  None recommended by PT    Frequency Min 3X/week   Progress towards PT Goals Progress towards PT goals: Progressing toward goals  Plan Current plan remains appropriate    Precautions / Restrictions Precautions Precautions: Fall   Pertinent Vitals/Pain No c/o pain    Mobility  Bed Mobility Bed Mobility: Supine to Sit Supine to Sit: 6: Modified independent  (Device/Increase time) Details for Bed Mobility Assistance: Needs extra time Transfers Transfers: Sit to Stand;Stand to Sit Sit to Stand: 6: Modified independent (Device/Increase time) Stand to Sit: 6: Modified independent (Device/Increase time) Ambulation/Gait Ambulation/Gait Assistance: 6: Modified independent (Device/Increase time) Ambulation Distance (Feet): 200 Feet Assistive device: None Gait Pattern: Step-through pattern Gait velocity: wfl Stairs: Yes Stairs Assistance: 6: Modified independent (Device/Increase time) Stair Management Technique: One rail Right;Forwards Number of Stairs: 9    Exercises     PT Diagnosis:    PT Problem List:   PT Treatment Interventions:     PT Goals (current goals can now be found in the care plan section) Acute Rehab PT Goals Patient Stated Goal: Did not set PT Goal Formulation: With patient Time For Goal Achievement: 01/13/13 Potential to Achieve Goals: Good  Visit Information  Last PT Received On: 01/07/13 History of Present Illness: [name] [name] is a 52 y.o. male history of hepatitis C, cirrhosis of liver, bipolar disorder and hypertension who's presently in recovery house was taken to his home yesterday by his wife as his dog was dying. Patient has been in the recovery house and being detoxed off Xanax abuse. As per wife patient has not been on Xanax for last 2 months. Yesterday patient wife feels that he may have stolen her Xanax and taken few of them. Patient's wife states that 20 tablets of 1 mg Xanax were missing from her bottle. Patient later was found to be very confused and thrashing around the house and was brought to the ER. Urine drug  screen was positive for benzodiazepine and opiates. Patient's labs also show an elevated ammonia levels and in the ER physician questioned if he was taking lactulose patient states that because he has had frequent diarrhea he stopped taking them as advised. Patient in addition was found to be anemic  with hemoglobin around 9 in the last one previously was on 13. Stool for occult blood was positive. Patient has been admitted for further management. Presently patient is oriented to his name but still confused.    Subjective Data  Subjective: "I'm feeling better today." Patient Stated Goal: Did not set   Cognition  Cognition Arousal/Alertness: Awake/alert Behavior During Therapy: WFL for tasks assessed/performed Overall Cognitive Status: Within Functional Limits for tasks assessed    Balance     End of Session PT - End of Session Equipment Utilized During Treatment: Gait belt Activity Tolerance: Patient tolerated treatment well Patient left: in chair;with call bell/phone within reach Nurse Communication: Mobility status   GP     Lashun Mccants 01/07/2013, 10:43 AM  Jake Shark, PT DPT [phone]

## 2013-07-06 ENCOUNTER — Encounter (HOSPITAL_COMMUNITY): Payer: Self-pay | Admitting: Emergency Medicine

## 2013-07-06 ENCOUNTER — Inpatient Hospital Stay (HOSPITAL_COMMUNITY)
Admission: EM | Admit: 2013-07-06 | Discharge: 2013-07-10 | DRG: 378 | Disposition: A | Discharge status: Home / Self Care | Payer: Medicare Other | Attending: Internal Medicine | Admitting: Internal Medicine

## 2013-07-06 DIAGNOSIS — B1920 Unspecified viral hepatitis C without hepatic coma: Secondary | ICD-10-CM | POA: Diagnosis present

## 2013-07-06 DIAGNOSIS — K7460 Unspecified cirrhosis of liver: Secondary | ICD-10-CM | POA: Diagnosis present

## 2013-07-06 DIAGNOSIS — I10 Essential (primary) hypertension: Secondary | ICD-10-CM | POA: Diagnosis present

## 2013-07-06 DIAGNOSIS — K5731 Diverticulosis of large intestine without perforation or abscess with bleeding: Principal | ICD-10-CM | POA: Diagnosis present

## 2013-07-06 DIAGNOSIS — D62 Acute posthemorrhagic anemia: Secondary | ICD-10-CM | POA: Diagnosis present

## 2013-07-06 DIAGNOSIS — E669 Obesity, unspecified: Secondary | ICD-10-CM | POA: Diagnosis present

## 2013-07-06 DIAGNOSIS — K922 Gastrointestinal hemorrhage, unspecified: Secondary | ICD-10-CM

## 2013-07-06 DIAGNOSIS — D649 Anemia, unspecified: Secondary | ICD-10-CM

## 2013-07-06 DIAGNOSIS — F319 Bipolar disorder, unspecified: Secondary | ICD-10-CM | POA: Diagnosis present

## 2013-07-06 DIAGNOSIS — Z91199 Patient's noncompliance with other medical treatment and regimen due to unspecified reason: Secondary | ICD-10-CM

## 2013-07-06 DIAGNOSIS — K766 Portal hypertension: Secondary | ICD-10-CM | POA: Diagnosis present

## 2013-07-06 DIAGNOSIS — F411 Generalized anxiety disorder: Secondary | ICD-10-CM | POA: Diagnosis present

## 2013-07-06 DIAGNOSIS — Z79899 Other long term (current) drug therapy: Secondary | ICD-10-CM

## 2013-07-06 DIAGNOSIS — R578 Other shock: Secondary | ICD-10-CM

## 2013-07-06 DIAGNOSIS — K319 Disease of stomach and duodenum, unspecified: Secondary | ICD-10-CM | POA: Diagnosis present

## 2013-07-06 DIAGNOSIS — K7030 Alcoholic cirrhosis of liver without ascites: Secondary | ICD-10-CM | POA: Diagnosis present

## 2013-07-06 DIAGNOSIS — Z9119 Patient's noncompliance with other medical treatment and regimen: Secondary | ICD-10-CM

## 2013-07-06 DIAGNOSIS — F1021 Alcohol dependence, in remission: Secondary | ICD-10-CM

## 2013-07-06 DIAGNOSIS — I8500 Esophageal varices without bleeding: Secondary | ICD-10-CM | POA: Diagnosis present

## 2013-07-06 DIAGNOSIS — I8510 Secondary esophageal varices without bleeding: Secondary | ICD-10-CM | POA: Diagnosis present

## 2013-07-06 DIAGNOSIS — Z87891 Personal history of nicotine dependence: Secondary | ICD-10-CM

## 2013-07-06 DIAGNOSIS — B1710 Acute hepatitis C without hepatic coma: Secondary | ICD-10-CM | POA: Diagnosis present

## 2013-07-06 LAB — CBC
HCT: 32.3 % — ABNORMAL LOW (ref 39.0–52.0)
Hemoglobin: 10.1 g/dL — ABNORMAL LOW (ref 13.0–17.0)
MCH: 22.3 pg — ABNORMAL LOW (ref 26.0–34.0)
MCHC: 31.3 g/dL (ref 30.0–36.0)
MCV: 71.5 fL — ABNORMAL LOW (ref 78.0–100.0)
PLATELETS: DECREASED 10*3/uL (ref 150–400)
RBC: 4.52 MIL/uL (ref 4.22–5.81)
RDW: 19.7 % — AB (ref 11.5–15.5)
WBC: 9.8 10*3/uL (ref 4.0–10.5)

## 2013-07-06 LAB — TYPE AND SCREEN
ABO/RH(D): O POS
Antibody Screen: NEGATIVE

## 2013-07-06 LAB — ABO/RH: ABO/RH(D): O POS

## 2013-07-06 MED ORDER — PANTOPRAZOLE SODIUM 40 MG IV SOLR
80.0000 mg | Freq: Once | INTRAVENOUS | Status: AC
  Administered 2013-07-07: 80 mg via INTRAVENOUS
  Filled 2013-07-06: qty 80

## 2013-07-06 MED ORDER — SODIUM CHLORIDE 0.9 % IV BOLUS (SEPSIS)
1000.0000 mL | Freq: Once | INTRAVENOUS | Status: AC
  Administered 2013-07-06: 1000 mL via INTRAVENOUS

## 2013-07-06 NOTE — ED Provider Notes (Signed)
CSN: [id number]     Arrival date & time 07/06/13  2125 History   First MD Initiated Contact with Patient 07/06/13 2259     Chief Complaint  Patient presents with  . Rectal Bleeding     (Consider location/radiation/quality/duration/timing/severity/associated sxs/prior Treatment) HPI  This patient is a 53 year old man with history of liver cirrhosis secondary to hepatitis C. He has portal hypertension and history of bleeding esophageal varices. He presents today after 5 episodes of hematochezia. First, the patient had 2 large stools which were mixed with dark red blood. He then had a stool which was almost all blood, dark with some clots. He has had 2 more similar stools in the emergency department.  The patient denies abdominal pain, epigastric pain and chest pain. He has been nauseated but has not vomited. He is unfamiliar with a history of lower GI bleeding. He reports that he received transfusion with 4 units of packed or blood cells as treatment for her previous esophageal variceal bleed.  The patient is not anticoagulated.  He feels generally weak. He felt lightheaded and near syncopal while coming from the waiting room to his room on side A.  Past Medical History  Diagnosis Date  . Thrombocytopenia   . Cirrhosis   . Hepatitis C   . Hypertension   . Anxiety   . Anemia   . Blood transfusion   . Obesity   . Pancreatitis   . Bipolar disorder, unspecified 09/27/2012  . Depression 09/27/2012   Past Surgical History  Procedure Laterality Date  . Tonsillectomy    . Esophagogastroduodenoscopy N/A 01/05/2013    Procedure: ESOPHAGOGASTRODUODENOSCOPY (EGD);  Surgeon: Vertell Novak., MD;  Location: [facility] ENDOSCOPY;  Service: Endoscopy;  Laterality: N/A;   Family History  Problem Relation Age of Onset  . Cancer - Other Mother     Uterine  . Coronary artery disease Father   . Coronary artery disease Paternal Grandfather   . Hypertension Father   . Hypertension Paternal Grandfather     History  Substance Use Topics  . Smoking status: Former Smoker    Types: Cigarettes    Quit date: 11/05/2003  . Smokeless tobacco: Not on file  . 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 states last etoh was 2011    Review of Systems Ten point review of symptoms performed and is negative with the exception of symptoms noted above.      Allergies  Trileptal  Home Medications   Current Outpatient Rx  Name  Route  Sig  Dispense  Refill  . acetaminophen (TYLENOL) 325 MG tablet   Oral   Take 650 mg by mouth every 6 (six) hours as needed for pain. For pain         . amantadine (SYMMETREL) 100 MG capsule   Oral   Take 1 capsule (100 mg total) by mouth 3 (three) times daily.   30 capsule   0   . FLUoxetine (PROZAC) 20 MG capsule   Oral   Take 20 mg by mouth daily.         . haloperidol (HALDOL) 5 MG tablet   Oral   Take 1 tablet (5 mg total) by mouth 2 (two) times daily. For mood control   60 tablet   3   . pantoprazole (PROTONIX) 40 MG tablet   Oral   Take 1 tablet (40 mg total) by mouth daily.   30 tablet  3   . propranolol (INDERAL) 20 MG tablet   Oral   Take 1 tablet (20 mg total) by mouth 2 (two) times daily.   60 tablet   3   . traMADol (ULTRAM) 50 MG tablet   Oral   Take by mouth every 6 (six) hours as needed for moderate pain.          BP 82/44  Pulse 106  Temp(Src) 97.4 F (36.3 C) (Oral)  Resp 18  SpO2 95% Physical Exam Gen: well developed and well nourished appearing Head: NCAT Eyes: PERL, EOMI, conjunctiva appear pale Nose: no epistaixis or rhinorrhea Mouth/throat: mucosa is moist and pink Neck: supple, no stridor Lungs: CTA B, no wheezing, rhonchi or rales CV: RRR, no murmur, extremities appear well perfused.  Abd: soft, notender, nondistended Rectal: blood noted on skin in perirectal region, DRE - small amount of gross dark red blood.  Back: no ttp, no cva ttp Skin: warm and dry,  pale Ext: normal to inspection, no dependent edema Neuro: CN ii-xii grossly intact, no focal deficits Psyche; normal affect,  calm and cooperative.   ED Course  Procedures (including critical care time) Labs Review  Results for orders placed during the hospital encounter of 07/06/13 (from the past 24 hour(s))  CBC     Status: Abnormal   Collection Time    07/06/13  9:33 PM      Result Value Ref Range   WBC 9.8  4.0 - 10.5 K/uL   RBC 4.52  4.22 - 5.81 MIL/uL   Hemoglobin 10.1 (*) 13.0 - 17.0 g/dL   HCT 28.432.3 (*) 13.239.0 - 44.052.0 %   MCV 71.5 (*) 78.0 - 100.0 fL   MCH 22.3 (*) 26.0 - 34.0 pg   MCHC 31.3  30.0 - 36.0 g/dL   RDW 10.219.7 (*) 72.511.5 - 36.615.5 %   Platelets    150 - 400 K/uL   Value: PLATELET CLUMPS NOTED ON SMEAR, COUNT APPEARS DECREASED  COMPREHENSIVE METABOLIC PANEL     Status: Abnormal   Collection Time    07/06/13  9:33 PM      Result Value Ref Range   Sodium 143  137 - 147 mEq/L   Potassium 5.1  3.7 - 5.3 mEq/L   Chloride 107  96 - 112 mEq/L   CO2 16 (*) 19 - 32 mEq/L   Glucose, Bld 113 (*) 70 - 99 mg/dL   BUN 36 (*) 6 - 23 mg/dL   Creatinine, Ser 4.400.94  0.50 - 1.35 mg/dL   Calcium 8.6  8.4 - 34.710.5 mg/dL   Total Protein 7.0  6.0 - 8.3 g/dL   Albumin 3.0 (*) 3.5 - 5.2 g/dL   AST 83 (*) 0 - 37 U/L   ALT 32  0 - 53 U/L   Alkaline Phosphatase 55  39 - 117 U/L   Total Bilirubin 0.9  0.3 - 1.2 mg/dL   GFR calc non Af Amer >90  >90 mL/min   GFR calc Af Amer >90  >90 mL/min  TYPE AND SCREEN     Status: None   Collection Time    07/06/13 10:17 PM      Result Value Ref Range   ABO/RH(D) O POS     Antibody Screen NEG     Sample Expiration 07/09/2013    ABO/RH     Status: None   Collection Time    07/06/13 10:17 PM      Result Value Ref Range  ABO/RH(D) O POS     EKG: nsr, no acute ischemic changes, normal intervals, normal axis, normal qrs complex  MDM   Patient with GI bleeding and hemorrhagic shock. Suspect bleeding esophageal varices. Patient resuscitated with  crystalloid and has been normotensive for the past hour without any further episodes of hematochezia. Repeat Hgb at 2h shows drop in ghb from 10.1 to 8.9,  Platelets are low at 108, INR wnl.   GI paged twice.  We are treating with Protonix and Octreotide. Patient is type and screened. Case discussed with Dr. Allena Katz who will admit the patient to the SDU.   CRITICAL CARE Performed by: Brandt Loosen   Total critical care time: 37m  Critical care time was exclusive of separately billable procedures and treating other patients.  Critical care was necessary to treat or prevent imminent or life-threatening deterioration.  Critical care was time spent personally by me on the following activities: development of treatment plan with patient and/or surrogate as well as nursing, discussions with consultants, evaluation of patient's response to treatment, examination of patient, obtaining history from patient or surrogate, ordering and performing treatments and interventions, ordering and review of laboratory studies, ordering and review of radiographic studies, pulse oximetry and re-evaluation of patient's condition.      Brandt Loosen, MD 07/07/13 [phone]

## 2013-07-06 NOTE — ED Notes (Signed)
Pt. reports rectal bleeding onset 5 pm today with RUQ pain , denies emesis or diarrhea.

## 2013-07-07 ENCOUNTER — Encounter (HOSPITAL_COMMUNITY): Payer: Self-pay | Admitting: *Deleted

## 2013-07-07 ENCOUNTER — Encounter (HOSPITAL_COMMUNITY)
Admission: EM | Disposition: A | Discharge status: Home / Self Care | Payer: Self-pay | Source: Home / Self Care | Attending: Internal Medicine

## 2013-07-07 ENCOUNTER — Inpatient Hospital Stay (HOSPITAL_COMMUNITY): Payer: Medicare Other

## 2013-07-07 DIAGNOSIS — I8500 Esophageal varices without bleeding: Secondary | ICD-10-CM

## 2013-07-07 DIAGNOSIS — F319 Bipolar disorder, unspecified: Secondary | ICD-10-CM

## 2013-07-07 DIAGNOSIS — D649 Anemia, unspecified: Secondary | ICD-10-CM

## 2013-07-07 DIAGNOSIS — K7460 Unspecified cirrhosis of liver: Secondary | ICD-10-CM

## 2013-07-07 DIAGNOSIS — F1021 Alcohol dependence, in remission: Secondary | ICD-10-CM

## 2013-07-07 DIAGNOSIS — K922 Gastrointestinal hemorrhage, unspecified: Secondary | ICD-10-CM

## 2013-07-07 HISTORY — PX: ESOPHAGOGASTRODUODENOSCOPY: SHX5428

## 2013-07-07 LAB — CBC
HCT: 26.4 % — ABNORMAL LOW (ref 39.0–52.0)
HEMATOCRIT: 28.6 % — AB (ref 39.0–52.0)
HEMOGLOBIN: 8.9 g/dL — AB (ref 13.0–17.0)
Hemoglobin: 8.2 g/dL — ABNORMAL LOW (ref 13.0–17.0)
MCH: 22.1 pg — ABNORMAL LOW (ref 26.0–34.0)
MCH: 21.8 pg — ABNORMAL LOW (ref 26.0–34.0)
MCHC: 31.1 g/dL (ref 30.0–36.0)
MCHC: 31.1 g/dL (ref 30.0–36.0)
MCV: 71 fL — ABNORMAL LOW (ref 78.0–100.0)
MCV: 70.2 fL — ABNORMAL LOW (ref 78.0–100.0)
Platelets: 108 10*3/uL — ABNORMAL LOW (ref 150–400)
Platelets: 93 10*3/uL — ABNORMAL LOW (ref 150–400)
RBC: 4.03 MIL/uL — ABNORMAL LOW (ref 4.22–5.81)
RBC: 3.76 MIL/uL — ABNORMAL LOW (ref 4.22–5.81)
RDW: 19.8 % — AB (ref 11.5–15.5)
RDW: 19.5 % — AB (ref 11.5–15.5)
WBC: 3.9 10*3/uL — ABNORMAL LOW (ref 4.0–10.5)
WBC: 7.1 10*3/uL (ref 4.0–10.5)

## 2013-07-07 LAB — POC OCCULT BLOOD, ED: FECAL OCCULT BLD: POSITIVE — AB

## 2013-07-07 LAB — PROTIME-INR
INR: 1.26 (ref 0.00–1.49)
PROTHROMBIN TIME: 15.5 s — AB (ref 11.6–15.2)

## 2013-07-07 LAB — COMPREHENSIVE METABOLIC PANEL
ALBUMIN: 3 g/dL — AB (ref 3.5–5.2)
ALK PHOS: 55 U/L (ref 39–117)
ALK PHOS: 60 U/L (ref 39–117)
ALT: 32 U/L (ref 0–53)
ALT: 26 U/L (ref 0–53)
AST: 83 U/L — ABNORMAL HIGH (ref 0–37)
AST: 54 U/L — ABNORMAL HIGH (ref 0–37)
Albumin: 2.9 g/dL — ABNORMAL LOW (ref 3.5–5.2)
BILIRUBIN TOTAL: 0.5 mg/dL (ref 0.3–1.2)
BUN: 36 mg/dL — ABNORMAL HIGH (ref 6–23)
BUN: 35 mg/dL — ABNORMAL HIGH (ref 6–23)
CHLORIDE: 110 meq/L (ref 96–112)
CO2: 16 mEq/L — ABNORMAL LOW (ref 19–32)
CO2: 20 meq/L (ref 19–32)
Calcium: 8.6 mg/dL (ref 8.4–10.5)
Calcium: 8.4 mg/dL (ref 8.4–10.5)
Chloride: 107 mEq/L (ref 96–112)
Creatinine, Ser: 0.94 mg/dL (ref 0.50–1.35)
Creatinine, Ser: 0.94 mg/dL (ref 0.50–1.35)
GFR calc Af Amer: >90 mL/min (ref >90)
GFR calc non Af Amer: >90 mL/min (ref >90)
GLUCOSE: 119 mg/dL — AB (ref 70–99)
Glucose, Bld: 113 mg/dL — ABNORMAL HIGH (ref 70–99)
POTASSIUM: 5.1 meq/L (ref 3.7–5.3)
POTASSIUM: 3.7 meq/L (ref 3.7–5.3)
SODIUM: 143 meq/L (ref 137–147)
SODIUM: 143 meq/L (ref 137–147)
TOTAL PROTEIN: 7 g/dL (ref 6.0–8.3)
Total Bilirubin: 0.9 mg/dL (ref 0.3–1.2)
Total Protein: 6.2 g/dL (ref 6.0–8.3)

## 2013-07-07 LAB — MAGNESIUM: MAGNESIUM: 1.9 mg/dL (ref 1.5–2.5)

## 2013-07-07 LAB — MRSA PCR SCREENING: MRSA by PCR: NEGATIVE

## 2013-07-07 LAB — APTT: APTT: 27 s (ref 24–37)

## 2013-07-07 SURGERY — EGD (ESOPHAGOGASTRODUODENOSCOPY)
Anesthesia: Moderate Sedation

## 2013-07-07 MED ORDER — FENTANYL CITRATE 0.05 MG/ML IJ SOLN
INTRAMUSCULAR | Status: DC | PRN
  Administered 2013-07-07 (×2): 25 ug via INTRAVENOUS

## 2013-07-07 MED ORDER — ONDANSETRON HCL 4 MG/2ML IJ SOLN
4.0000 mg | Freq: Four times a day (QID) | INTRAMUSCULAR | Status: DC | PRN
  Administered 2013-07-07 – 2013-07-08 (×2): 4 mg via INTRAVENOUS
  Filled 2013-07-07 (×2): qty 2

## 2013-07-07 MED ORDER — SODIUM CHLORIDE 0.9 % IV SOLN
50.0000 ug/h | INTRAVENOUS | Status: DC
  Administered 2013-07-07 – 2013-07-08 (×3): 50 ug/h via INTRAVENOUS
  Filled 2013-07-07 (×6): qty 1

## 2013-07-07 MED ORDER — DIPHENHYDRAMINE HCL 50 MG/ML IJ SOLN
25.0000 mg | Freq: Once | INTRAMUSCULAR | Status: AC
  Administered 2013-07-07: 25 mg via INTRAVENOUS

## 2013-07-07 MED ORDER — SODIUM CHLORIDE 0.9 % IJ SOLN
3.0000 mL | Freq: Two times a day (BID) | INTRAMUSCULAR | Status: DC
Start: 2013-07-07 — End: 2013-07-10
  Administered 2013-07-07 – 2013-07-08 (×5): 3 mL via INTRAVENOUS
  Administered 2013-07-08: 10 mL via INTRAVENOUS
  Administered 2013-07-08 – 2013-07-09 (×3): 3 mL via INTRAVENOUS

## 2013-07-07 MED ORDER — FENTANYL CITRATE 0.05 MG/ML IJ SOLN
INTRAMUSCULAR | Status: AC
  Filled 2013-07-07: qty 2

## 2013-07-07 MED ORDER — SODIUM CHLORIDE 0.9 % IV SOLN: INTRAVENOUS | Status: DC

## 2013-07-07 MED ORDER — SODIUM CHLORIDE 0.9 % IV SOLN
INTRAVENOUS | Status: DC
  Administered 2013-07-07: 04:00:00 via INTRAVENOUS
  Administered 2013-07-07: 75 mL/h via INTRAVENOUS
  Administered 2013-07-08: via INTRAVENOUS
  Administered 2013-07-08: 75 mL/h via INTRAVENOUS

## 2013-07-07 MED ORDER — MIDAZOLAM HCL 5 MG/ML IJ SOLN
INTRAMUSCULAR | Status: AC
  Filled 2013-07-07: qty 2

## 2013-07-07 MED ORDER — DEXTROSE 5 % IV SOLN
1.0000 g | Freq: Once | INTRAVENOUS | Status: AC
  Administered 2013-07-07: 1 g via INTRAVENOUS
  Filled 2013-07-07: qty 10

## 2013-07-07 MED ORDER — OCTREOTIDE ACETATE 50 MCG/ML IJ SOLN: 50.0000 ug | INTRAMUSCULAR | Status: DC

## 2013-07-07 MED ORDER — BUTAMBEN-TETRACAINE-BENZOCAINE 2-2-14 % EX AERO
INHALATION_SPRAY | CUTANEOUS | Status: DC | PRN
  Administered 2013-07-07: 2 via TOPICAL

## 2013-07-07 MED ORDER — DIPHENHYDRAMINE HCL 50 MG/ML IJ SOLN
INTRAMUSCULAR | Status: AC
  Filled 2013-07-07: qty 1

## 2013-07-07 MED ORDER — MORPHINE SULFATE 2 MG/ML IJ SOLN
2.0000 mg | INTRAMUSCULAR | Status: DC | PRN
  Administered 2013-07-07 – 2013-07-09 (×9): 2 mg via INTRAVENOUS
  Filled 2013-07-07 (×9): qty 1

## 2013-07-07 MED ORDER — FLUOXETINE HCL 20 MG PO CAPS
20.0000 mg | ORAL_CAPSULE | Freq: Every day | ORAL | Status: DC
  Administered 2013-07-07 – 2013-07-09 (×3): 20 mg via ORAL
  Filled 2013-07-07 (×3): qty 1

## 2013-07-07 MED ORDER — ONDANSETRON HCL 4 MG PO TABS: 4.0000 mg | ORAL_TABLET | Freq: Four times a day (QID) | ORAL | Status: DC | PRN

## 2013-07-07 MED ORDER — HALOPERIDOL 5 MG PO TABS
5.0000 mg | ORAL_TABLET | Freq: Every day | ORAL | Status: DC
  Filled 2013-07-07: qty 1

## 2013-07-07 MED ORDER — OCTREOTIDE LOAD VIA INFUSION
50.0000 ug | Freq: Once | INTRAVENOUS | Status: DC
  Filled 2013-07-07: qty 25

## 2013-07-07 MED ORDER — MIDAZOLAM HCL 10 MG/2ML IJ SOLN
INTRAMUSCULAR | Status: DC | PRN
  Administered 2013-07-07 (×2): 2 mg via INTRAVENOUS
  Administered 2013-07-07 (×2): 1 mg via INTRAVENOUS

## 2013-07-07 MED ORDER — HALOPERIDOL 5 MG PO TABS
5.0000 mg | ORAL_TABLET | Freq: Two times a day (BID) | ORAL | Status: DC
  Administered 2013-07-07 – 2013-07-09 (×5): 5 mg via ORAL
  Filled 2013-07-07 (×7): qty 1

## 2013-07-07 NOTE — Progress Notes (Signed)
Asked Dr. Donette LarryHusain if patient could travel off telemetry. Dr. Donette LarryHusain stated that "That was fine."

## 2013-07-07 NOTE — Progress Notes (Signed)
Utilization review completed. Gustin Zobrist, RN, BSN. 

## 2013-07-07 NOTE — Consult Note (Signed)
[facility] Consult Note  Referring Provider: No ref. provider found Primary Care Physician:  Wenda Low, MD Primary Gastroenterologist:  Dr.  Laurel Dimmer Complaint: Rectal bleeding HPI: [name] [name] is an 53 y.o. white male  with a history of hepatitis C and cirrhosis who presents with several episodes of bright to dark red blood per rectum with some dizziness and some right upper quadrant abdominal pain. He has had nausea but no vomiting. He has a history of cirrhosis from hepatitis C. His last EGD was in August 2014 and showed small varices with no stigmata of hemorrhage. He states she's had a colonoscopy within the last year which showed polyps. He states he has been somewhat noncompliant with his beta blocker and proton pump inhibitor. His hemoglobin was 8.9 on admission with a slightly elevated BUN.  Past Medical History  Diagnosis Date  . Thrombocytopenia   . Cirrhosis   . Hepatitis C   . Hypertension   . Anxiety   . Anemia   . Blood transfusion   . Obesity   . Pancreatitis   . Bipolar disorder, unspecified 09/27/2012  . Depression 09/27/2012    Past Surgical History  Procedure Laterality Date  . Tonsillectomy    . Esophagogastroduodenoscopy N/A 01/05/2013    Procedure: ESOPHAGOGASTRODUODENOSCOPY (EGD);  Surgeon: Winfield Cunas., MD;  Location: [facility] ENDOSCOPY;  Service: Endoscopy;  Laterality: N/A;    Medications Prior to Admission  Medication Sig Dispense Refill  . acetaminophen (TYLENOL) 325 MG tablet Take 650 mg by mouth every 6 (six) hours as needed for pain. For pain      . amantadine (SYMMETREL) 100 MG capsule Take 1 capsule (100 mg total) by mouth 3 (three) times daily.  30 capsule  0  . FLUoxetine (PROZAC) 20 MG capsule Take 20 mg by mouth daily.      . haloperidol (HALDOL) 5 MG tablet Take 1 tablet (5 mg total) by mouth 2 (two) times daily. For mood control  60 tablet  3  . pantoprazole (PROTONIX) 40 MG tablet Take 1 tablet (40 mg total) by mouth daily.   30 tablet  3  . propranolol (INDERAL) 20 MG tablet Take 1 tablet (20 mg total) by mouth 2 (two) times daily.  60 tablet  3  . traMADol (ULTRAM) 50 MG tablet Take by mouth every 6 (six) hours as needed for moderate pain.        Allergies:  Allergies  Allergen Reactions  . Trileptal [Oxcarbazepine] Other (See Comments)    Sleep walking & hallucinations    Family History  Problem Relation Age of Onset  . Cancer - Other Mother     Uterine  . Coronary artery disease Father   . Coronary artery disease Paternal Grandfather   . Hypertension Father   . Hypertension Paternal Grandfather     Social History:  reports that he quit smoking about 9 years ago. His smoking use included Cigarettes. He smoked 0.00 packs per day. He does not have any smokeless tobacco history on file. He reports that he drinks alcohol. He reports that he uses illicit drugs (Cocaine, Hydrocodone, and Benzodiazepines).  Review of Systems: negative except as above   Blood pressure 115/76, pulse 78, temperature 99.2 F (37.3 C), temperature source Oral, resp. rate 16, height $RemoveBe'6\' 5"'rFbECSVtg$  (1.956 m), weight 135.2 kg (298 lb 1 oz), SpO2 96.00%. Head: Normocephalic, without obvious abnormality, atraumatic Neck: no adenopathy, no carotid bruit, no JVD, supple, symmetrical, trachea midline and thyroid not enlarged, symmetric, no tenderness/mass/nodules  Resp: clear to auscultation bilaterally Cardio: regular rate and rhythm, S1, S2 normal, no murmur, click, rub or gallop GI: Abdomen soft, slightly distended with normoactive bowel sounds. No pets or megaly masses or guarding. Mild tenderness in the right mid abdomen Extremities: extremities normal, atraumatic, no cyanosis or edema  Results for orders placed during the hospital encounter of 07/06/13 (from the past 48 hour(s))  CBC     Status: Abnormal   Collection Time    07/06/13  9:33 PM      Result Value Ref Range   WBC 9.8  4.0 - 10.5 K/uL   RBC 4.52  4.22 - 5.81 MIL/uL    Hemoglobin 10.1 (*) 13.0 - 17.0 g/dL   HCT 32.3 (*) 39.0 - 52.0 %   MCV 71.5 (*) 78.0 - 100.0 fL   MCH 22.3 (*) 26.0 - 34.0 pg   MCHC 31.3  30.0 - 36.0 g/dL   RDW 19.7 (*) 11.5 - 15.5 %   Platelets    150 - 400 K/uL   Value: PLATELET CLUMPS NOTED ON SMEAR, COUNT APPEARS DECREASED  COMPREHENSIVE METABOLIC PANEL     Status: Abnormal   Collection Time    07/06/13  9:33 PM      Result Value Ref Range   Sodium 143  137 - 147 mEq/L   Potassium 5.1  3.7 - 5.3 mEq/L   Comment: HEMOLYSIS AT THIS LEVEL MAY AFFECT RESULT   Chloride 107  96 - 112 mEq/L   CO2 16 (*) 19 - 32 mEq/L   Glucose, Bld 113 (*) 70 - 99 mg/dL   BUN 36 (*) 6 - 23 mg/dL   Creatinine, Ser 0.94  0.50 - 1.35 mg/dL   Calcium 8.6  8.4 - 10.5 mg/dL   Total Protein 7.0  6.0 - 8.3 g/dL   Albumin 3.0 (*) 3.5 - 5.2 g/dL   AST 83 (*) 0 - 37 U/L   Comment: HEMOLYSIS AT THIS LEVEL MAY AFFECT RESULT   ALT 32  0 - 53 U/L   Comment: HEMOLYSIS AT THIS LEVEL MAY AFFECT RESULT   Alkaline Phosphatase 55  39 - 117 U/L   Total Bilirubin 0.9  0.3 - 1.2 mg/dL   GFR calc non Af Amer >90  >90 mL/min   GFR calc Af Amer >90  >90 mL/min   Comment: (NOTE)     The eGFR has been calculated using the CKD EPI equation.     This calculation has not been validated in all clinical situations.     eGFR's persistently <90 mL/min signify possible Chronic Kidney     Disease.  MAGNESIUM     Status: None   Collection Time    07/06/13  9:33 PM      Result Value Ref Range   Magnesium 1.9  1.5 - 2.5 mg/dL  TYPE AND SCREEN     Status: None   Collection Time    07/06/13 10:17 PM      Result Value Ref Range   ABO/RH(D) O POS     Antibody Screen NEG     Sample Expiration 07/09/2013    ABO/RH     Status: None   Collection Time    07/06/13 10:17 PM      Result Value Ref Range   ABO/RH(D) O POS    CBC     Status: Abnormal   Collection Time    07/06/13 11:35 PM      Result Value Ref Range   WBC  7.1  4.0 - 10.5 K/uL   RBC 4.03 (*) 4.22 - 5.81 MIL/uL    Hemoglobin 8.9 (*) 13.0 - 17.0 g/dL   HCT 28.6 (*) 39.0 - 52.0 %   MCV 71.0 (*) 78.0 - 100.0 fL   MCH 22.1 (*) 26.0 - 34.0 pg   MCHC 31.1  30.0 - 36.0 g/dL   RDW 19.5 (*) 11.5 - 15.5 %   Platelets 108 (*) 150 - 400 K/uL   Comment: PLATELET COUNT CONFIRMED BY SMEAR  PROTIME-INR     Status: Abnormal   Collection Time    07/07/13 12:20 AM      Result Value Ref Range   Prothrombin Time 15.5 (*) 11.6 - 15.2 seconds   INR 1.26  0.00 - 1.49  APTT     Status: None   Collection Time    07/07/13 12:20 AM      Result Value Ref Range   aPTT 27  24 - 37 seconds  POC OCCULT BLOOD, ED     Status: Abnormal   Collection Time    07/07/13  1:06 AM      Result Value Ref Range   Fecal Occult Bld POSITIVE (*) NEGATIVE  COMPREHENSIVE METABOLIC PANEL     Status: Abnormal   Collection Time    07/07/13  5:35 AM      Result Value Ref Range   Sodium 143  137 - 147 mEq/L   Potassium 3.7  3.7 - 5.3 mEq/L   Chloride 110  96 - 112 mEq/L   CO2 20  19 - 32 mEq/L   Glucose, Bld 119 (*) 70 - 99 mg/dL   BUN 35 (*) 6 - 23 mg/dL   Creatinine, Ser 0.94  0.50 - 1.35 mg/dL   Calcium 8.4  8.4 - 10.5 mg/dL   Total Protein 6.2  6.0 - 8.3 g/dL   Albumin 2.9 (*) 3.5 - 5.2 g/dL   AST 54 (*) 0 - 37 U/L   ALT 26  0 - 53 U/L   Alkaline Phosphatase 60  39 - 117 U/L   Total Bilirubin 0.5  0.3 - 1.2 mg/dL   GFR calc non Af Amer >90  >90 mL/min   GFR calc Af Amer >90  >90 mL/min   Comment: (NOTE)     The eGFR has been calculated using the CKD EPI equation.     This calculation has not been validated in all clinical situations.     eGFR's persistently <90 mL/min signify possible Chronic Kidney     Disease.  CBC     Status: Abnormal   Collection Time    07/07/13  5:35 AM      Result Value Ref Range   WBC 3.9 (*) 4.0 - 10.5 K/uL   RBC 3.76 (*) 4.22 - 5.81 MIL/uL   Hemoglobin 8.2 (*) 13.0 - 17.0 g/dL   HCT 26.4 (*) 39.0 - 52.0 %   MCV 70.2 (*) 78.0 - 100.0 fL   MCH 21.8 (*) 26.0 - 34.0 pg   MCHC 31.1  30.0 - 36.0  g/dL   RDW 19.8 (*) 11.5 - 15.5 %   Platelets 93 (*) 150 - 400 K/uL   Comment: CONSISTENT WITH PREVIOUS RESULT   No results found.  Assessment: GI bleeding and sure whether upper or lower but with a history of cirrhosis and presumed upper GI bleed from varices in the past and fairly recent colonoscopy. Plan:  Will begin workup with EGD which will be  done later today to rule out upper GI bleeding. Sevannah Madia C 07/07/2013, 6:47 AM

## 2013-07-07 NOTE — H&P (Signed)
[facility] History and Physical  Patient: [name] [name]  ZOX:[id number]  DOB: 19-Dec-1960  DOS: the patient was seen and examined on 07/07/2013 PCP: Georgann HousekeeperHUSAIN,KARRAR, MD  Chief Complaint: Hematochezia  HPI: [name] [name] is a 53 y.o. male with Past medical history of C., cirrhosis, hypertension, history of alcohol abuse, depression. The patient is coming from home. The patient presents with complaint of dark maroon bowel movements that started around 6 PM. So far he has 4-5 bowel movements with dark maroon to bright red blood. Initially it was painless but later on he started having pain on right side of her stomach which appears sharp in nature. He denies any nausea, vomiting, melena, hematemesis, acid reflux, fever, chills, or recent change in his medications. He mentions he is noncompliant with his medications as not taking his medication on regular basis including Protonix and propranolol. After his discharge he had a colonoscopy which was not showing acute bleeding and had some polyps.  Review of Systems: as mentioned in the history of present illness.  A Comprehensive review of the other systems is negative.  Past Medical History  Diagnosis Date  . Thrombocytopenia   . Cirrhosis   . Hepatitis C   . Hypertension   . Anxiety   . Anemia   . Blood transfusion   . Obesity   . Pancreatitis   . Bipolar disorder, unspecified 09/27/2012  . Depression 09/27/2012   Past Surgical History  Procedure Laterality Date  . Tonsillectomy    . Esophagogastroduodenoscopy N/A 01/05/2013    Procedure: ESOPHAGOGASTRODUODENOSCOPY (EGD);  Surgeon: Vertell NovakJames L Edwards Jr., MD;  [facility];  Service: Endoscopy;  Laterality: N/A;   Social History:  reports that he quit smoking about 9 years ago. His smoking use included Cigarettes. He smoked 0.00 packs per day. He does not have any smokeless tobacco history on file. He reports that he drinks alcohol. He reports that he uses illicit drugs  (Cocaine, Hydrocodone, and Benzodiazepines). Independent for most of his  ADL.  Allergies  Allergen Reactions  . Trileptal [Oxcarbazepine] Other (See Comments)    Sleep walking & hallucinations    Family History  Problem Relation Age of Onset  . Cancer - Other Mother     Uterine  . Coronary artery disease Father   . Coronary artery disease Paternal Grandfather   . Hypertension Father   . Hypertension Paternal Grandfather     Prior to Admission medications   Medication Sig Start Date End Date Taking? Authorizing Provider  acetaminophen (TYLENOL) 325 MG tablet Take 650 mg by mouth every 6 (six) hours as needed for pain. For pain   Yes Historical Provider, MD  amantadine (SYMMETREL) 100 MG capsule Take 1 capsule (100 mg total) by mouth 3 (three) times daily. 01/05/13  Yes Georgann HousekeeperKarrar Husain, MD  FLUoxetine (PROZAC) 20 MG capsule Take 20 mg by mouth daily.   Yes Historical Provider, MD  haloperidol (HALDOL) 5 MG tablet Take 1 tablet (5 mg total) by mouth 2 (two) times daily. For mood control 01/06/13  Yes Georgann HousekeeperKarrar Husain, MD  pantoprazole (PROTONIX) 40 MG tablet Take 1 tablet (40 mg total) by mouth daily. 01/06/13  Yes Georgann HousekeeperKarrar Husain, MD  propranolol (INDERAL) 20 MG tablet Take 1 tablet (20 mg total) by mouth 2 (two) times daily. 01/06/13  Yes Georgann HousekeeperKarrar Husain, MD  traMADol (ULTRAM) 50 MG tablet Take by mouth every 6 (six) hours as needed for moderate pain.   Yes Historical Provider, MD    Physical Exam: Filed Vitals:  07/07/13 0230 07/07/13 0320 07/07/13 0330 07/07/13 0400  BP: 97/63 114/71  97/48  Pulse: 100 88  84  Temp:    99.2 F (37.3 C)  TempSrc:    Oral  Resp:    15  Height:   6\' 5"  (1.956 m)   Weight:   135.2 kg (298 lb 1 oz)   SpO2: 96% 97%  96%    General: Alert, Awake and Oriented to Time, Place and Person. Appear in mild distress Eyes: PERRL ENT: Oral Mucosa clear moist. Neck: No JVD Cardiovascular: S1 and S2 Present, no Murmur, Peripheral Pulses Present Respiratory:  Bilateral Air entry equal and Decreased, Clear to Auscultation,  No Crackles, no wheezes Abdomen: Bowel Sound Present, Soft and Non tender Skin: No Rash Extremities: Bilateral Pedal edema, no calf tenderness Neurologic: Grossly Unremarkable. Labs on Admission:  CBC:  Recent Labs Lab 07/06/13 2133 07/06/13 2335  WBC 9.8 7.1  HGB 10.1* 8.9*  HCT 32.3* 28.6*  MCV 71.5* 71.0*  PLT PLATELET CLUMPS NOTED ON SMEAR, COUNT APPEARS DECREASED 108*    CMP     Component Value Date/Time   NA 143 07/06/2013 2133   K 5.1 07/06/2013 2133   CL 107 07/06/2013 2133   CO2 16* 07/06/2013 2133   GLUCOSE 113* 07/06/2013 2133   BUN 36* 07/06/2013 2133   CREATININE 0.94 07/06/2013 2133   CALCIUM 8.6 07/06/2013 2133   PROT 7.0 07/06/2013 2133   ALBUMIN 3.0* 07/06/2013 2133   AST 83* 07/06/2013 2133   ALT 32 07/06/2013 2133   ALKPHOS 55 07/06/2013 2133   BILITOT 0.9 07/06/2013 2133   GFRNONAA >90 07/06/2013 2133   GFRAA >90 07/06/2013 2133    No results found for this basename: LIPASE, AMYLASE,  in the last 168 hours No results found for this basename: AMMONIA,  in the last 168 hours  No results found for this basename: CKTOTAL, CKMB, CKMBINDEX, TROPONINI,  in the last 168 hours BNP (last 3 results) No results found for this basename: PROBNP,  in the last 8760 hours  Radiological Exams on Admission: No results found.   Assessment/Plan Principal Problem:   GI bleed Active Problems:   HEPATITIS C   ANEMIA, SECONDARY TO ACUTE BLOOD LOSS   CIRRHOSIS   Bipolar disorder, unspecified   1. GI bleed The patient presents with complaints of GI bleed. His hemoglobin has dropped 1-2 points. He was initially hypotensive but after mild IV hydration his blood pressure is stable. He does not have active bleeding since 7 PM. With this the patient will be admitted to hospital step down unit. They will monitor his CBC every 4 hours. IV Protonix drip and IV octreotide drip. Continue IV hydration N.p.o. and GI  consult. Transfuse as needed Empiric ceftriaxone Ultrasound of abdomen for his complaint of right-sided pain  2. Depression Patient denies any suicide ideation continue to monitor   Consults: Gastroenterology  DVT Prophylaxis: mechanical compression device Nutrition: N.p.o.  Code Status: Full  Disposition: Admitted to inpatient in step-down unit.  Author: Lynden Oxford, MD [facility] Pager: [phone] 07/07/2013, 4:40 AM    If 7PM-7AM, please contact night-coverage [email or web address] Password [email or web address]

## 2013-07-07 NOTE — ED Notes (Signed)
[facility]

## 2013-07-07 NOTE — Op Note (Signed)
[facility] [address] [place], [id number]   ENDOSCOPY PROCEDURE REPORT  PATIENT: [name] Pandaarrish, [name] [name]  MR#: [id number] BIRTHDATE: 04/03/1961 , 52  yrs. old GENDER: Male ENDOSCOPIST: Wandalee FerdinandSam Floria Brandau, MD REFERRED BY: PROCEDURE DATE:  07/07/2013 PROCEDURE:   EGD ASA CLASS: 3 INDICATIONS: rectal bleeding in a patient with a history of cirrhosis of the liver, hepatitis C, small esophageal varices, and diverticulosis MEDICATIONS: fentanyl 50 mcg IV, Versed 6 mg IV, Benadryl 25 mg IV TOPICAL ANESTHETIC:  DESCRIPTION OF PROCEDURE:   After the risks benefits and alternatives of the procedure were thoroughly explained, informed consent was obtained.  The Pentax Gastroscope [id number]  endoscope was introduced through the mouth and advanced to the second portion of the duodenum      , limited by Without limitations.   The instrument was slowly withdrawn as the mucosa was fully examined.      FINDINGS:  Esophagus: Very small distal esophageal varices which flatten out with air insufflation. No evidence of stigmata of bleeding.  Stomach: Small amount of retained food in the stomach. Portal hypertensive gastropathy. A few scattered superficial ulcerations in the antrum. No evidence of active bleeding and no blood seen in the stomach.  Duodenum: Normal  COMPLICATIONS:none  ENDOSCOPIC IMPRESSION:see above   RECOMMENDATIONS:continue PPI therapy and octreotide currently. I do not see any signs of active bleeding from the upper GI tract. This patient had a colonoscopy in 2011 which showed multiple diverticula in the sigmoid colon. This could be a lower GI bleed from diverticulosis. Should bleeding continue I would recommend getting a nuclear medicine GI bleeding scan to further investigate.      _______________________________ Rhodia AlbrighteSigned:  Sam Lymon Kidney, MD 07/07/2013 3:29 PM

## 2013-07-07 NOTE — Progress Notes (Signed)
Patient picked up for abdominal ultrasound at this time.

## 2013-07-07 NOTE — Progress Notes (Signed)
Subjective: Pt c/o Abdominal pain   Objective: Vital signs in last 24 hours: Temp:  [97.4 F (36.3 C)-99.2 F (37.3 C)] 99.2 F (37.3 C) (02/20 0400) Pulse Rate:  [76-116] 84 (02/20 0700) Resp:  [15-22] 18 (02/20 0700) BP: (82-126)/(44-79) 103/59 mmHg (02/20 0700) SpO2:  [94 %-97 %] 95 % (02/20 0700) Weight:  [135.2 kg (298 lb 1 oz)] 135.2 kg (298 lb 1 oz) (02/20 0330) Weight change:  Last BM Date: 07/06/13  Intake/Output from previous day: 02/19 0701 - 02/20 0700 In: 390 [I.V.:340; IV Piggyback:50] Out: 275 [Urine:275] Intake/Output this shift:    General appearance: alert Resp: clear to auscultation bilaterally Cardio: regular rate and rhythm GI: soft tender mild diffuse; ascites  Lab Results:  Recent Labs  07/06/13 2335 07/07/13 0535  WBC 7.1 3.9*  HGB 8.9* 8.2*  HCT 28.6* 26.4*  PLT 108* 93*   BMET  Recent Labs  07/06/13 2133 07/07/13 0535  NA 143 143  K 5.1 3.7  CL 107 110  CO2 16* 20  GLUCOSE 113* 119*  BUN 36* 35*  CREATININE 0.94 0.94  CALCIUM 8.6 8.4    Studies/Results: No results found.  Medications: I have reviewed the patient's current medications.  Assessment/Plan: GI bleed- probable UGI with Cirrohsis and varies- pt had stop taking meds- inderal and protonix; non complaince [facility] EGD today- continue current meds U/S abdomen- to eval Ascites- ? SBP- on ABX Anemia- blood count ok-  Transfuse if HGB< 8. Depression-/ Bipolar on Haldol and prozac- restart once PO BP low   LOS: 1 day   Marijayne Rauth 07/07/2013, 8:06 AM

## 2013-07-07 NOTE — Progress Notes (Signed)
INITIAL NUTRITION ASSESSMENT  DOCUMENTATION CODES Per approved criteria  -Obesity Unspecified   INTERVENTION:  RD to follow nutrition care plan and will add supplements, such as Resource Breeze BID, as necessary  NUTRITION DIAGNOSIS: Inadequate oral intake related to inability to eat as evidenced by NPO status.   Goal: Patient to meet >/=90% of estimated nutrition needs.  Monitor:  PO diet advancement, I/Os, weight trends, labs  Reason for Assessment: Malnutrition Screening Tool Risk  53 y.o. male  Admitting Dx: GI bleed  ASSESSMENT: [name] [name] is a 53 y.o. male with Past medical history of C., cirrhosis, hypertension, history of alcohol abuse, depression.    The patient presents with complaint of dark maroon bowel movements that started around 6 PM. So far he has 4-5 bowel movements with dark maroon to bright red blood. Initially it was painless but later on he started having pain on right side of her stomach which appears sharp in nature.   He mentions he is noncompliant with his medications as not taking his medication on regular basis including Protonix and propranolol.   [facility]  EGD planned for today, 2/20, suspected upper GI bleed  Patient reports a 20 lb weight loss (6.3%) since early November. Patient stated that weight loss is tied to severe depression that prevented him from getting out of bed. Patient reports a poor appetite and was eating 2 meals per day PTA.   Dietetic intern was unable to complete nutrition focused physical exam as patient was transported for ultrasound before assessment was complete. Patient appeared well nourished.  Height: Ht Readings from Last 1 Encounters:  07/07/13 6\' 5"  (1.956 m)    Weight: Wt Readings from Last 1 Encounters:  07/07/13 298 lb 1 oz (135.2 kg)    Ideal Body Weight: 208 lb (94.5 kg)  % Ideal Body Weight: 143%  Wt Readings from Last 10 Encounters:  07/07/13 298 lb 1 oz  (135.2 kg)  07/07/13 298 lb 1 oz (135.2 kg)  01/04/13 315 lb 14.7 oz (143.3 kg)  01/04/13 315 lb 14.7 oz (143.3 kg)  09/28/12 299 lb 6.2 oz (135.8 kg)  09/11/12 288 lb (130.636 kg)  04/01/12 302 lb 0.5 oz (137 kg)  10/30/11 272 lb (123.378 kg)  06/24/11 287 lb (130.182 kg)    Usual Body Weight: 305 lb (138.6 kg)  % Usual Body Weight: 98%  BMI:  Body mass index is 35.34 kg/(m^2).  Estimated Nutritional Needs: Kcal: 2200-2400 Protein: 130-140 grams Fluid: 2.2-2.4 L or per MD  Skin: no wounds   Diet Order: NPO  EDUCATION NEEDS: -No education needs identified at this time   Intake/Output Summary (Last 24 hours) at 07/07/13 1119 Last data filed at 07/07/13 1100  Gross per 24 hour  Intake    790 ml  Output    650 ml  Net    140 ml    Last BM: 2/19  Labs:   Recent Labs Lab 07/06/13 2133 07/07/13 0535  NA 143 143  K 5.1 3.7  CL 107 110  CO2 16* 20  BUN 36* 35*  CREATININE 0.94 0.94  CALCIUM 8.6 8.4  MG 1.9  --   GLUCOSE 113* 119*    CBG (last 3)  No results found for this basename: GLUCAP,  in the last 72 hours  Scheduled Meds: . FLUoxetine  20 mg Oral Daily  . haloperidol  5 mg Oral QHS  . octreotide  50 mcg Intravenous Once  .  sodium chloride  3 mL Intravenous Q12H    Continuous Infusions: . sodium chloride 75 mL/hr at 07/07/13 0400  . sodium chloride    . octreotide (SANDOSTATIN) infusion 50 mcg/hr (07/07/13 1100)    Past Medical History  Diagnosis Date  . Thrombocytopenia   . Cirrhosis   . Hepatitis C   . Hypertension   . Anxiety   . Anemia   . Blood transfusion   . Obesity   . Pancreatitis   . Bipolar disorder, unspecified 09/27/2012  . Depression 09/27/2012    Past Surgical History  Procedure Laterality Date  . Tonsillectomy    . Esophagogastroduodenoscopy N/A 01/05/2013    Procedure: ESOPHAGOGASTRODUODENOSCOPY (EGD);  Surgeon: Vertell Novak., MD;  Location: [facility] ENDOSCOPY;  Service: Endoscopy;  Laterality: N/A;    Marlane Mingle, Dietetic Intern Pager: [phone]

## 2013-07-08 LAB — COMPREHENSIVE METABOLIC PANEL
ALT: 38 U/L (ref 0–53)
AST: 96 U/L — ABNORMAL HIGH (ref 0–37)
Albumin: 2.8 g/dL — ABNORMAL LOW (ref 3.5–5.2)
Alkaline Phosphatase: 49 U/L (ref 39–117)
BUN: 17 mg/dL (ref 6–23)
CALCIUM: 8.3 mg/dL — AB (ref 8.4–10.5)
CO2: 22 mEq/L (ref 19–32)
CREATININE: 0.69 mg/dL (ref 0.50–1.35)
Chloride: 107 mEq/L (ref 96–112)
GFR calc non Af Amer: >90 mL/min (ref >90)
GLUCOSE: 104 mg/dL — AB (ref 70–99)
Potassium: 3.9 mEq/L (ref 3.7–5.3)
SODIUM: 140 meq/L (ref 137–147)
TOTAL PROTEIN: 6.1 g/dL (ref 6.0–8.3)
Total Bilirubin: 0.7 mg/dL (ref 0.3–1.2)

## 2013-07-08 LAB — CBC
HCT: 25.1 % — ABNORMAL LOW (ref 39.0–52.0)
Hemoglobin: 7.7 g/dL — ABNORMAL LOW (ref 13.0–17.0)
MCH: 21.8 pg — ABNORMAL LOW (ref 26.0–34.0)
MCHC: 30.7 g/dL (ref 30.0–36.0)
MCV: 71.1 fL — ABNORMAL LOW (ref 78.0–100.0)
Platelets: 70 10*3/uL — ABNORMAL LOW (ref 150–400)
RBC: 3.53 MIL/uL — ABNORMAL LOW (ref 4.22–5.81)
RDW: 19.9 % — ABNORMAL HIGH (ref 11.5–15.5)
WBC: 2 10*3/uL — ABNORMAL LOW (ref 4.0–10.5)

## 2013-07-08 MED ORDER — ZOLPIDEM TARTRATE 5 MG PO TABS
5.0000 mg | ORAL_TABLET | Freq: Once | ORAL | Status: AC
  Administered 2013-07-08: 5 mg via ORAL
  Filled 2013-07-08: qty 1

## 2013-07-08 MED ORDER — PANTOPRAZOLE SODIUM 40 MG IV SOLR
40.0000 mg | INTRAVENOUS | Status: DC
  Administered 2013-07-08: 40 mg via INTRAVENOUS
  Filled 2013-07-08 (×3): qty 40

## 2013-07-08 NOTE — Progress Notes (Signed)
Subjective: No further bleeding.  No complaits  Objective: Vital signs in last 24 hours: Temp:  [97.7 F (36.5 C)-98.8 F (37.1 C)] 97.8 F (36.6 C) (02/21 0743) Pulse Rate:  [62-94] 66 (02/21 0800) Resp:  [11-23] 20 (02/21 0800) BP: (90-147)/(53-94) 125/79 mmHg (02/21 0800) SpO2:  [93 %-99 %] 94 % (02/21 0800) Weight:  [133.9 kg (295 lb 3.1 oz)] 133.9 kg (295 lb 3.1 oz) (02/21 0500) Weight change: -1.3 kg (-2 lb 13.9 oz) Last BM Date: 07/06/13  Intake/Output from previous day: 02/20 0701 - 02/21 0700 In: 3466.7 [P.O.:1320; I.V.:2146.7] Out: 3075 [Urine:3075] Intake/Output this shift: Total I/O In: 900 [P.O.:600; I.V.:300] Out: -   General appearance: alert and cooperative Resp: clear to auscultation bilaterally Cardio: regular rate and rhythm, S1, S2 normal, no murmur, click, rub or gallop GI: soft, non-tender; bowel sounds normal; no masses,  no organomegaly Extremities: extremities normal, atraumatic, no cyanosis or edema  Lab Results:  Recent Labs  07/07/13 0535 07/08/13 0300  WBC 3.9* 2.0*  HGB 8.2* 7.7*  HCT 26.4* 25.1*  PLT 93* 70*   BMET  Recent Labs  07/07/13 0535 07/08/13 0300  NA 143 140  K 3.7 3.9  CL 110 107  CO2 20 22  GLUCOSE 119* 104*  BUN 35* 17  CREATININE 0.94 0.69  CALCIUM 8.4 8.3*    Studies/Results: [place] Abdomen Complete  07/07/2013   CLINICAL DATA:  Abdominal pain, history of cirrhosis  EXAM: ULTRASOUND ABDOMEN COMPLETE  COMPARISON:  Ultrasound of the abdomen of 08/23/2012  FINDINGS: Gallbladder:  The gallbladder is visualized and there are several gallstones present, the largest of 1.4 cm in diameter. No pain is present over the gallbladder with compression.  Common bile duct:  Diameter: The common bile duct is partially obscured by bowel gas, with the best measurement being 5.3 mm in diameter.  Liver:  The liver is inhomogeneous and nodular consistent with changes of cirrhosis. No focal hepatic lesion is seen.  IVC:  Portions of the  IVC are obscured by bowel gas.  Pancreas:  The tail of the pancreas is obscured by bowel gas.  Spleen:  The spleen is enlarged measuring 17.0 cm sagittally. The splenic volume is 972 cubic cm. There is a 12 x 9 mm echogenic focus within the spleen probably representing a benign process such as hemangioma.  Right Kidney:  Length: 13.2 cm.  No hydronephrosis is seen.  Left Kidney:  Length: 13.4 cm. 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  Abdominal aorta:  The abdominal aorta is partially obscured by bowel gas.  Other findings:  IMPRESSION: 1. Inhomogeneous nodular liver consistent with changes of cirrhosis. No focal hepatic lesion is seen. 2. Gallstones. 3. Splenomegaly.   Electronically Signed   By: Dwyane DeePaul  Barry M.D.   On: 07/07/2013 12:35    Medications: I have reviewed the patient's current medications.  Assessment/Plan: GI bleed- EGD noted, no sign of variceal bleed, ? Lower bleed, no sign active bleeding over 24 hours, management of meds and diet by GI  U/S abdomen- no ascites, stopped rocephin  Anemia- blood count borderline, no transfusion yet unless he develops rebleeding.  Recheck in AM.  Depression-/ Bipolar on Haldol and prozac- restarted    LOS: 2 days   [name] [name] JOSEPH 07/08/2013, 10:30 AM

## 2013-07-08 NOTE — Progress Notes (Signed)
[facility] Progress Note  Subjective: Feels better. No obvious signs of GI bleeding overnight. EGD was negative for source of bleeding. He does have a history of diverticulosis. He would like to advance his diet.  Objective: Vital signs in last 24 hours: Temp:  [97.7 F (36.5 C)-98.8 F (37.1 C)] 97.8 F (36.6 C) (02/21 0743) Pulse Rate:  [62-94] 66 (02/21 0800) Resp:  [11-23] 20 (02/21 0800) BP: (90-147)/(53-94) 125/79 mmHg (02/21 0800) SpO2:  [93 %-99 %] 94 % (02/21 0800) Weight:  [133.9 kg (295 lb 3.1 oz)] 133.9 kg (295 lb 3.1 oz) (02/21 0500) Weight change: -1.3 kg (-2 lb 13.9 oz)   PE:  He is in no distress  Abdomen soft nontender  Lab Results: Results for orders placed during the hospital encounter of 07/06/13 (from the past 24 hour(s))  CBC     Status: Abnormal   Collection Time    07/08/13  3:00 AM      Result Value Ref Range   WBC 2.0 (*) 4.0 - 10.5 K/uL   RBC 3.53 (*) 4.22 - 5.81 MIL/uL   Hemoglobin 7.7 (*) 13.0 - 17.0 g/dL   HCT 16.125.1 (*) 09.639.0 - 04.552.0 %   MCV 71.1 (*) 78.0 - 100.0 fL   MCH 21.8 (*) 26.0 - 34.0 pg   MCHC 30.7  30.0 - 36.0 g/dL   RDW 40.919.9 (*) 81.111.5 - 91.415.5 %   Platelets 70 (*) 150 - 400 K/uL  COMPREHENSIVE METABOLIC PANEL     Status: Abnormal   Collection Time    07/08/13  3:00 AM      Result Value Ref Range   Sodium 140  137 - 147 mEq/L   Potassium 3.9  3.7 - 5.3 mEq/L   Chloride 107  96 - 112 mEq/L   CO2 22  19 - 32 mEq/L   Glucose, Bld 104 (*) 70 - 99 mg/dL   BUN 17  6 - 23 mg/dL   Creatinine, Ser 7.820.69  0.50 - 1.35 mg/dL   Calcium 8.3 (*) 8.4 - 10.5 mg/dL   Total Protein 6.1  6.0 - 8.3 g/dL   Albumin 2.8 (*) 3.5 - 5.2 g/dL   AST 96 (*) 0 - 37 U/L   ALT 38  0 - 53 U/L   Alkaline Phosphatase 49  39 - 117 U/L   Total Bilirubin 0.7  0.3 - 1.2 mg/dL   GFR calc non Af Amer >90  >90 mL/min   GFR calc Af Amer >90  >90 mL/min    Studies/Results: @RISRSLT24 @    Assessment: GI bleeding. No obvious source on EGD. He does  have a history of diverticulosis of the colon.  Plan: Continue to monitor for further bleeding. Advance diet. Discontinue octreotide. Continue PPI therapy. If he has further bleeding I would suggest a nuclear medicine GI bleeding scan to see if we can detect a site in the lower GI tract.    Yuriy Cui F 07/08/2013, 10:30 AM

## 2013-07-09 LAB — BASIC METABOLIC PANEL
BUN: 12 mg/dL (ref 6–23)
CHLORIDE: 103 meq/L (ref 96–112)
CO2: 21 meq/L (ref 19–32)
Calcium: 8.3 mg/dL — ABNORMAL LOW (ref 8.4–10.5)
Creatinine, Ser: 0.65 mg/dL (ref 0.50–1.35)
GFR calc Af Amer: >90 mL/min (ref >90)
GFR calc non Af Amer: >90 mL/min (ref >90)
Glucose, Bld: 111 mg/dL — ABNORMAL HIGH (ref 70–99)
POTASSIUM: 3.3 meq/L — AB (ref 3.7–5.3)
Sodium: 136 mEq/L — ABNORMAL LOW (ref 137–147)

## 2013-07-09 LAB — IRON AND TIBC
Iron: 14 ug/dL — ABNORMAL LOW (ref 42–135)
Saturation Ratios: 3 % — ABNORMAL LOW (ref 20–55)
TIBC: 427 ug/dL (ref 215–435)
UIBC: 413 ug/dL — AB (ref 125–400)

## 2013-07-09 LAB — CBC
HCT: 25.2 % — ABNORMAL LOW (ref 39.0–52.0)
Hemoglobin: 7.8 g/dL — ABNORMAL LOW (ref 13.0–17.0)
MCH: 22 pg — AB (ref 26.0–34.0)
MCHC: 31 g/dL (ref 30.0–36.0)
MCV: 71 fL — AB (ref 78.0–100.0)
Platelets: 90 10*3/uL — ABNORMAL LOW (ref 150–400)
RBC: 3.55 MIL/uL — AB (ref 4.22–5.81)
RDW: 19.3 % — AB (ref 11.5–15.5)
WBC: 3.9 10*3/uL — ABNORMAL LOW (ref 4.0–10.5)

## 2013-07-09 LAB — FERRITIN: Ferritin: 9 ng/mL — ABNORMAL LOW (ref 22–322)

## 2013-07-09 MED ORDER — PANTOPRAZOLE SODIUM 40 MG PO TBEC
40.0000 mg | DELAYED_RELEASE_TABLET | Freq: Every day | ORAL | Status: DC
  Administered 2013-07-09 – 2013-07-10 (×2): 40 mg via ORAL
  Filled 2013-07-09 (×2): qty 1

## 2013-07-09 MED ORDER — IRON 325 (65 FE) MG PO TABS: 1.0000 | ORAL_TABLET | Freq: Two times a day (BID) | ORAL | Status: DC

## 2013-07-09 MED ORDER — PROPRANOLOL HCL 20 MG PO TABS: 20.0000 mg | ORAL_TABLET | Freq: Two times a day (BID) | ORAL | Status: DC

## 2013-07-09 MED ORDER — LORAZEPAM 0.5 MG PO TABS
0.5000 mg | ORAL_TABLET | Freq: Once | ORAL | Status: AC
  Administered 2013-07-09: 0.5 mg via ORAL
  Filled 2013-07-09: qty 1

## 2013-07-09 MED ORDER — ACETAMINOPHEN 325 MG PO TABS: 650.0000 mg | ORAL_TABLET | Freq: Four times a day (QID) | ORAL | Status: DC | PRN

## 2013-07-09 MED ORDER — PANTOPRAZOLE SODIUM 40 MG PO TBEC: 40.0000 mg | DELAYED_RELEASE_TABLET | Freq: Every day | ORAL | Status: DC

## 2013-07-09 NOTE — Discharge Summary (Signed)
Physician Discharge Summary  Patient ID: [name] [name] MRN: [id number] DOB/AGE: 52-May-1962 52 y.o.  Admit date: 07/06/2013 Discharge date: 07/09/2013  Admission Diagnoses: GI bleed Hepatitis C Liver cirrhosis Esophageal varices Acute blood loss and iron deficiency anemia Bipolar disorder  Discharge Diagnoses:  Principal Problem:   GI bleed Active Problems:   HEPATITIS C   ANEMIA, SECONDARY TO ACUTE BLOOD LOSS/Iron deficiency   ESOPHAGEAL VARICES   CIRRHOSIS   Bipolar disorder, unspecified   Discharged Condition: good  Hospital Course: The patient was admitted on February 20 complaining of dark maroon bowel movements started the night admission. Initially painless and then associated some right-sided abdominal pain. He denied nausea, vomiting, hematemesis. He had a history of bipolar disorder but had been noncompliant with his free prescriptions for weeks, felt it caused mental clouding and felt better without them without any reported psychiatric symptoms at this time. He was also noncompliant with his Protonix and propranolol. On admission hemoglobin was 10.1 MCV 71.5, BUN 36, creatinine 0.94, albumin 3.0 LFTs normal. The patient was admitted and started on IV Protonix and octreotide drip, given IV fluids and made n.p.o. He was given empiric Rocephin for the possibility of spontaneous bacterial peritonitis. He was seen by gastroenterology. On February 20 he underwent upper endoscopy by Dr. Evette CristalGanem which showed a very small distal esophageal varices without evidence of stigmata of bleeding, portal hypertensive gastropathy and a few superficial ulcerations but no evidence of active bleeding in the stomach or duodenum or esophagus. It was felt he might have a diverticular bleed from the colon. The patient evidenced no evidence of bleeding in the 72 hours prior to discharge. His hemoglobin dropped to 7.7 and then stabilized. Iron studies showed ferritin of 9 and iron sulfate was started at  discharge.  Octreotide drip was stopped and he was switched to oral Protonix. His diet was advanced. Iron levels were checked and are pending. Rocephin was stopped after ultrasound of abdomen showed no evidence of ascites  Consults: GI  Significant Diagnostic Studies: labs: hemoglobin 7.8 2/22,  Bun Cr 12/.65,, ferritin 9. and radiology: Ultrasound: Of the abdomen showed inhomogenous nodular liver consistent with changes of cirrhosis, no ascites, gallstones or splenomegaly  Treatments: IV hydration, antibiotics: ceftriaxone and procedures: Upper endoscopy  Discharge Exam: Blood pressure 124/73, pulse 76, temperature 98.6 F (37 C), temperature source Oral, resp. rate 17, height 6\' 5"  (1.956 m), weight 133.9 kg (295 lb 3.1 oz), SpO2 96.00%. General appearance: alert and cooperative Resp: clear to auscultation bilaterally Cardio: regular rate and rhythm, S1, S2 normal, no murmur, click, rub or gallop GI: soft, non-tender; bowel sounds normal; no masses,  no organomegaly  Disposition: 01-Home or Self Care     Medication List    STOP taking these medications       amantadine 100 MG capsule  Commonly known as:  SYMMETREL     FLUoxetine 20 MG capsule  Commonly known as:  PROZAC     haloperidol 5 MG tablet  Commonly known as:  HALDOL      TAKE these medications       acetaminophen 325 MG tablet  Commonly known as:  TYLENOL  Take 650 mg by mouth every 6 (six) hours as needed for pain. For pain     Iron 325 (65 FE) MG Tabs  Take 1 tablet by mouth 2 (two) times daily.              pantoprazole 40 MG tablet  Commonly known as:  PROTONIX  Take 1 tablet (40 mg total) by mouth daily.     propranolol 20 MG tablet  Commonly known as:  INDERAL  Take 1 tablet (20 mg total) by mouth 2 (two) times daily.     traMADol 50 MG tablet  Commonly known as:  ULTRAM  Take by mouth every 6 (six) hours as needed for moderate pain.           Follow-up Information   Follow up with  Georgann Housekeeper, MD In 2 weeks.   Specialty:  Internal Medicine   Contact information:   [address] [place] [address] [phone]       Signed: Lillia Mountain 07/09/2013, 11:08 AM

## 2013-07-09 NOTE — Discharge Instructions (Signed)
Call us if any further bleeding occurs. Take iron tablet twice a day

## 2013-07-09 NOTE — Progress Notes (Signed)
Subjective: No bleeding, still feels weak.  Objective: Vital signs in last 24 hours: Temp:  [97.8 F (36.6 C)-98.6 F (37 C)] 98.6 F (37 C) (02/22 0605) Pulse Rate:  [68-97] 76 (02/22 0605) Resp:  [13-21] 17 (02/22 0605) BP: (107-147)/(58-87) 124/73 mmHg (02/22 0605) SpO2:  [91 %-97 %] 96 % (02/22 0605) Weight change:  Last BM Date: 07/08/13  Intake/Output from previous day: 02/21 0701 - 02/22 0700 In: 1615.4 [P.O.:900; I.V.:715.4] Out: 2977 [Urine:2975; Emesis/NG output:1; Stool:1] Intake/Output this shift: Total I/O In: -  Out: 275 [Urine:275]  General appearance: alert and cooperative GI: soft, non-tender; bowel sounds normal; no masses,  no organomegaly  Lab Results:  Recent Labs  07/08/13 0300 07/09/13 0446  WBC 2.0* 3.9*  HGB 7.7* 7.8*  HCT 25.1* 25.2*  PLT 70* 90*   BMET  Recent Labs  07/08/13 0300 07/09/13 0446  NA 140 136*  K 3.9 3.3*  CL 107 103  CO2 22 21  GLUCOSE 104* 111*  BUN 17 12  CREATININE 0.69 0.65  CALCIUM 8.3* 8.3*    Studies/Results: [place] Abdomen Complete  07/07/2013   CLINICAL DATA:  Abdominal pain, history of cirrhosis  EXAM: ULTRASOUND ABDOMEN COMPLETE  COMPARISON:  Ultrasound of the abdomen of 08/23/2012  FINDINGS: Gallbladder:  The gallbladder is visualized and there are several gallstones present, the largest of 1.4 cm in diameter. No pain is present over the gallbladder with compression.  Common bile duct:  Diameter: The common bile duct is partially obscured by bowel gas, with the best measurement being 5.3 mm in diameter.  Liver:  The liver is inhomogeneous and nodular consistent with changes of cirrhosis. No focal hepatic lesion is seen.  IVC:  Portions of the IVC are obscured by bowel gas.  Pancreas:  The tail of the pancreas is obscured by bowel gas.  Spleen:  The spleen is enlarged measuring 17.0 cm sagittally. The splenic volume is 972 cubic cm. There is a 12 x 9 mm echogenic focus within the spleen probably representing a  benign process such as hemangioma.  Right Kidney:  Length: 13.2 cm.  No hydronephrosis is seen.  Left Kidney:  Length: 13.4 cm. 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  Abdominal aorta:  The abdominal aorta is partially obscured by bowel gas.  Other findings:  IMPRESSION: 1. Inhomogeneous nodular liver consistent with changes of cirrhosis. No focal hepatic lesion is seen. 2. Gallstones. 3. Splenomegaly.   Electronically Signed   By: Dwyane DeePaul  Barry M.D.   On: 07/07/2013 12:35    Medications: I have reviewed the patient's current medications.  Assessment/Plan: GI bleed- EGD noted, no sign of variceal bleed, ? Lower bleed, no sign active bleeding over 48 hours, on regular diet, check iron levels, ambulate today and probable discharge in am.  GI,should he be on propranolol?  U/S abdomen- no ascites, stopped rocephin   Anemia- blood count stable, no transfusion yet unless he develops rebleeding. Recheck in AM. Check iron studies  Depression-/ Bipolar had not taken meds for weeks prior to admission because of side effects, he felt he was stable without them, will hold for now and he will discuss meds with psychiatry    LOS: 3 days   Berk Pilot [name] [name] 07/09/2013, 10:51 AM

## 2013-07-10 ENCOUNTER — Encounter (HOSPITAL_COMMUNITY): Payer: Self-pay | Admitting: Gastroenterology

## 2013-07-10 LAB — CBC
HCT: 24.3 % — ABNORMAL LOW (ref 39.0–52.0)
Hemoglobin: 7.5 g/dL — ABNORMAL LOW (ref 13.0–17.0)
MCH: 22 pg — AB (ref 26.0–34.0)
MCHC: 30.9 g/dL (ref 30.0–36.0)
MCV: 71.3 fL — ABNORMAL LOW (ref 78.0–100.0)
Platelets: 70 10*3/uL — ABNORMAL LOW (ref 150–400)
RBC: 3.41 MIL/uL — ABNORMAL LOW (ref 4.22–5.81)
RDW: 19.9 % — ABNORMAL HIGH (ref 11.5–15.5)
WBC: 2.1 10*3/uL — ABNORMAL LOW (ref 4.0–10.5)

## 2013-07-10 MED ORDER — POTASSIUM CHLORIDE CRYS ER 20 MEQ PO TBCR
40.0000 meq | EXTENDED_RELEASE_TABLET | Freq: Once | ORAL | Status: AC
  Administered 2013-07-10: 40 meq via ORAL
  Filled 2013-07-10: qty 2

## 2013-07-10 MED ORDER — FERROUS SULFATE 325 (65 FE) MG PO TABS
325.0000 mg | ORAL_TABLET | Freq: Two times a day (BID) | ORAL | Status: DC
  Administered 2013-07-10: 325 mg via ORAL
  Filled 2013-07-10 (×3): qty 1

## 2013-07-10 NOTE — Care Management Note (Signed)
    Page 1 of 1   07/10/2013     5:51:33 PM   CARE MANAGEMENT NOTE 07/10/2013  Patient:  [name] [name],[name] [name]   Account Number:  [id number]  Date Initiated:  07/10/2013  Documentation initiated by:  Letha CapeAYLOR,Aeisha Minarik  Subjective/Objective Assessment:   dx gib  admit     Action/Plan:   Anticipated DC Date:  07/10/2013   Anticipated DC Plan:  HOME/SELF CARE      DC Planning Services  CM consult      Choice offered to / List presented to:             Status of service:  Completed, signed off Medicare Important Message given?   (If response is "NO", the following Medicare IM given date fields will be blank) Date Medicare IM given:   Date Additional Medicare IM given:    Discharge Disposition:  HOME/SELF CARE  Per UR Regulation:  Reviewed for med. necessity/level of care/duration of stay  If discussed at Long Length of Stay Meetings, dates discussed:    Comments:

## 2013-08-07 NOTE — Progress Notes (Signed)
I agree with the Student-Dietitian note and made appropriate revisions.  Katie Kenlea Woodell, RD, LDN Pager #: [phone] After-Hours Pager #: [phone]  

## 2013-08-18 ENCOUNTER — Inpatient Hospital Stay (HOSPITAL_COMMUNITY): Payer: Medicare Other

## 2013-08-18 ENCOUNTER — Encounter (HOSPITAL_COMMUNITY): Payer: Self-pay | Admitting: *Deleted

## 2013-08-18 ENCOUNTER — Encounter (HOSPITAL_COMMUNITY)
Admission: EM | Disposition: A | Discharge status: Hospice | Payer: Self-pay | Source: Home / Self Care | Attending: Internal Medicine

## 2013-08-18 ENCOUNTER — Emergency Department (HOSPITAL_COMMUNITY): Payer: Medicare Other

## 2013-08-18 ENCOUNTER — Inpatient Hospital Stay (HOSPITAL_COMMUNITY)
Admission: EM | Admit: 2013-08-18 | Discharge: 2013-08-30 | DRG: 432 | Disposition: A | Discharge status: Hospice | Payer: Medicare Other | Attending: Internal Medicine | Admitting: Internal Medicine

## 2013-08-18 DIAGNOSIS — G9340 Encephalopathy, unspecified: Secondary | ICD-10-CM

## 2013-08-18 DIAGNOSIS — K922 Gastrointestinal hemorrhage, unspecified: Secondary | ICD-10-CM

## 2013-08-18 DIAGNOSIS — E872 Acidosis, unspecified: Secondary | ICD-10-CM

## 2013-08-18 DIAGNOSIS — R578 Other shock: Secondary | ICD-10-CM | POA: Diagnosis present

## 2013-08-18 DIAGNOSIS — D62 Acute posthemorrhagic anemia: Secondary | ICD-10-CM

## 2013-08-18 DIAGNOSIS — K7460 Unspecified cirrhosis of liver: Secondary | ICD-10-CM

## 2013-08-18 DIAGNOSIS — I10 Essential (primary) hypertension: Secondary | ICD-10-CM | POA: Diagnosis present

## 2013-08-18 DIAGNOSIS — E870 Hyperosmolality and hypernatremia: Secondary | ICD-10-CM | POA: Diagnosis not present

## 2013-08-18 DIAGNOSIS — F319 Bipolar disorder, unspecified: Secondary | ICD-10-CM | POA: Diagnosis present

## 2013-08-18 DIAGNOSIS — B1710 Acute hepatitis C without hepatic coma: Secondary | ICD-10-CM

## 2013-08-18 DIAGNOSIS — N170 Acute kidney failure with tubular necrosis: Secondary | ICD-10-CM | POA: Diagnosis not present

## 2013-08-18 DIAGNOSIS — K7200 Acute and subacute hepatic failure without coma: Secondary | ICD-10-CM | POA: Diagnosis not present

## 2013-08-18 DIAGNOSIS — D684 Acquired coagulation factor deficiency: Secondary | ICD-10-CM | POA: Diagnosis not present

## 2013-08-18 DIAGNOSIS — Z515 Encounter for palliative care: Secondary | ICD-10-CM

## 2013-08-18 DIAGNOSIS — K319 Disease of stomach and duodenum, unspecified: Secondary | ICD-10-CM | POA: Diagnosis present

## 2013-08-18 DIAGNOSIS — J9819 Other pulmonary collapse: Secondary | ICD-10-CM | POA: Diagnosis not present

## 2013-08-18 DIAGNOSIS — R579 Shock, unspecified: Secondary | ICD-10-CM

## 2013-08-18 DIAGNOSIS — I8511 Secondary esophageal varices with bleeding: Secondary | ICD-10-CM | POA: Diagnosis present

## 2013-08-18 DIAGNOSIS — F411 Generalized anxiety disorder: Secondary | ICD-10-CM | POA: Diagnosis not present

## 2013-08-18 DIAGNOSIS — D72829 Elevated white blood cell count, unspecified: Secondary | ICD-10-CM | POA: Diagnosis present

## 2013-08-18 DIAGNOSIS — K7030 Alcoholic cirrhosis of liver without ascites: Principal | ICD-10-CM | POA: Diagnosis present

## 2013-08-18 DIAGNOSIS — K7682 Hepatic encephalopathy: Secondary | ICD-10-CM

## 2013-08-18 DIAGNOSIS — B1921 Unspecified viral hepatitis C with hepatic coma: Secondary | ICD-10-CM | POA: Diagnosis present

## 2013-08-18 DIAGNOSIS — Z87891 Personal history of nicotine dependence: Secondary | ICD-10-CM

## 2013-08-18 DIAGNOSIS — D61818 Other pancytopenia: Secondary | ICD-10-CM | POA: Diagnosis not present

## 2013-08-18 DIAGNOSIS — E876 Hypokalemia: Secondary | ICD-10-CM | POA: Diagnosis not present

## 2013-08-18 DIAGNOSIS — Z6835 Body mass index (BMI) 35.0-35.9, adult: Secondary | ICD-10-CM

## 2013-08-18 DIAGNOSIS — K766 Portal hypertension: Secondary | ICD-10-CM | POA: Diagnosis present

## 2013-08-18 DIAGNOSIS — K7290 Hepatic failure, unspecified without coma: Secondary | ICD-10-CM

## 2013-08-18 DIAGNOSIS — Z66 Do not resuscitate: Secondary | ICD-10-CM | POA: Diagnosis not present

## 2013-08-18 DIAGNOSIS — Z9119 Patient's noncompliance with other medical treatment and regimen: Secondary | ICD-10-CM

## 2013-08-18 DIAGNOSIS — Z79899 Other long term (current) drug therapy: Secondary | ICD-10-CM

## 2013-08-18 DIAGNOSIS — J9600 Acute respiratory failure, unspecified whether with hypoxia or hypercapnia: Secondary | ICD-10-CM | POA: Diagnosis present

## 2013-08-18 DIAGNOSIS — E669 Obesity, unspecified: Secondary | ICD-10-CM | POA: Diagnosis present

## 2013-08-18 DIAGNOSIS — F1920 Other psychoactive substance dependence, uncomplicated: Secondary | ICD-10-CM

## 2013-08-18 DIAGNOSIS — E873 Alkalosis: Secondary | ICD-10-CM | POA: Diagnosis not present

## 2013-08-18 DIAGNOSIS — Z91199 Patient's noncompliance with other medical treatment and regimen due to unspecified reason: Secondary | ICD-10-CM

## 2013-08-18 DIAGNOSIS — J9601 Acute respiratory failure with hypoxia: Secondary | ICD-10-CM

## 2013-08-18 HISTORY — PX: ESOPHAGOGASTRODUODENOSCOPY: SHX5428

## 2013-08-18 LAB — URINALYSIS, ROUTINE W REFLEX MICROSCOPIC
Bilirubin Urine: NEGATIVE
Glucose, UA: NEGATIVE mg/dL
Hgb urine dipstick: NEGATIVE
Ketones, ur: 15 mg/dL — AB
LEUKOCYTES UA: NEGATIVE
Nitrite: NEGATIVE
PROTEIN: NEGATIVE mg/dL
Specific Gravity, Urine: 1.021 (ref 1.005–1.030)
UROBILINOGEN UA: 1 mg/dL (ref 0.0–1.0)
pH: 5 (ref 5.0–8.0)

## 2013-08-18 LAB — COMPREHENSIVE METABOLIC PANEL
ALT: 48 U/L (ref 0–53)
ALT: 53 U/L (ref 0–53)
AST: 102 U/L — ABNORMAL HIGH (ref 0–37)
AST: 116 U/L — ABNORMAL HIGH (ref 0–37)
Albumin: 2.8 g/dL — ABNORMAL LOW (ref 3.5–5.2)
Albumin: 3 g/dL — ABNORMAL LOW (ref 3.5–5.2)
Alkaline Phosphatase: 51 U/L (ref 39–117)
Alkaline Phosphatase: 51 U/L (ref 39–117)
BILIRUBIN TOTAL: 1.3 mg/dL — AB (ref 0.3–1.2)
BUN: 51 mg/dL — ABNORMAL HIGH (ref 6–23)
BUN: 49 mg/dL — ABNORMAL HIGH (ref 6–23)
CHLORIDE: 107 meq/L (ref 96–112)
CO2: 8 meq/L — AB (ref 19–32)
CREATININE: 1.09 mg/dL (ref 0.50–1.35)
Calcium: 8.7 mg/dL (ref 8.4–10.5)
Calcium: 8.6 mg/dL (ref 8.4–10.5)
Chloride: 111 mEq/L (ref 96–112)
Creatinine, Ser: 1.09 mg/dL (ref 0.50–1.35)
GFR calc non Af Amer: 77 mL/min — ABNORMAL LOW (ref >90)
GFR, EST AFRICAN AMERICAN: 89 mL/min — AB (ref >90)
GFR, EST AFRICAN AMERICAN: 89 mL/min — AB (ref >90)
GFR, EST NON AFRICAN AMERICAN: 77 mL/min — AB (ref >90)
GLUCOSE: 159 mg/dL — AB (ref 70–99)
Glucose, Bld: 133 mg/dL — ABNORMAL HIGH (ref 70–99)
POTASSIUM: 5.3 meq/L (ref 3.7–5.3)
Potassium: 4.9 mEq/L (ref 3.7–5.3)
SODIUM: 149 meq/L — AB (ref 137–147)
Sodium: 147 mEq/L (ref 137–147)
TOTAL PROTEIN: 6.2 g/dL (ref 6.0–8.3)
Total Bilirubin: 1.1 mg/dL (ref 0.3–1.2)
Total Protein: 5.8 g/dL — ABNORMAL LOW (ref 6.0–8.3)

## 2013-08-18 LAB — POCT I-STAT 3, ART BLOOD GAS (G3+)
Acid-base deficit: 19 mmol/L — ABNORMAL HIGH (ref 0.0–2.0)
Acid-base deficit: 20 mmol/L — ABNORMAL HIGH (ref 0.0–2.0)
Bicarbonate: 9.1 mEq/L — ABNORMAL LOW (ref 20.0–24.0)
Bicarbonate: 9.1 mEq/L — ABNORMAL LOW (ref 20.0–24.0)
O2 SAT: 98 %
O2 Saturation: 96 %
PCO2 ART: 28.6 mmHg — AB (ref 35.0–45.0)
PH ART: 7 — AB (ref 7.350–7.450)
Patient temperature: 98.6
Patient temperature: 98.6
TCO2: 10 mmol/L (ref 0–100)
TCO2: 10 mmol/L (ref 0–100)
pCO2 arterial: 37 mmHg (ref 35.0–45.0)
pH, Arterial: 7.108 — CL (ref 7.350–7.450)
pO2, Arterial: 122 mmHg — ABNORMAL HIGH (ref 80.0–100.0)
pO2, Arterial: 148 mmHg — ABNORMAL HIGH (ref 80.0–100.0)

## 2013-08-18 LAB — CBC WITH DIFFERENTIAL/PLATELET
BASOS ABS: 0 10*3/uL (ref 0.0–0.1)
Basophils Relative: 0 % (ref 0–1)
EOS ABS: 0 10*3/uL (ref 0.0–0.7)
Eosinophils Relative: 0 % (ref 0–5)
HEMATOCRIT: 18.3 % — AB (ref 39.0–52.0)
Hemoglobin: 4.9 g/dL — CL (ref 13.0–17.0)
LYMPHS ABS: 1.6 10*3/uL (ref 0.7–4.0)
Lymphocytes Relative: 7 % — ABNORMAL LOW (ref 12–46)
MCH: 18.4 pg — ABNORMAL LOW (ref 26.0–34.0)
MCHC: 26.8 g/dL — AB (ref 30.0–36.0)
MCV: 68.5 fL — AB (ref 78.0–100.0)
Monocytes Absolute: 1.1 10*3/uL — ABNORMAL HIGH (ref 0.1–1.0)
Monocytes Relative: 5 % (ref 3–12)
NEUTROS ABS: 19.6 10*3/uL — AB (ref 1.7–7.7)
Neutrophils Relative %: 88 % — ABNORMAL HIGH (ref 43–77)
PLATELETS: 230 10*3/uL (ref 150–400)
RBC: 2.67 MIL/uL — ABNORMAL LOW (ref 4.22–5.81)
RDW: 20.7 % — AB (ref 11.5–15.5)
WBC: 22.3 10*3/uL — ABNORMAL HIGH (ref 4.0–10.5)

## 2013-08-18 LAB — PHOSPHORUS: Phosphorus: 3.7 mg/dL (ref 2.3–4.6)

## 2013-08-18 LAB — CBC
HEMATOCRIT: 17.5 % — AB (ref 39.0–52.0)
Hemoglobin: 4.8 g/dL — CL (ref 13.0–17.0)
MCH: 18.8 pg — ABNORMAL LOW (ref 26.0–34.0)
MCHC: 27.4 g/dL — ABNORMAL LOW (ref 30.0–36.0)
MCV: 68.4 fL — AB (ref 78.0–100.0)
Platelets: 212 10*3/uL (ref 150–400)
RBC: 2.56 MIL/uL — AB (ref 4.22–5.81)
RDW: 20.2 % — AB (ref 11.5–15.5)
WBC: 17.7 10*3/uL — ABNORMAL HIGH (ref 4.0–10.5)

## 2013-08-18 LAB — I-STAT ARTERIAL BLOOD GAS, ED
Acid-base deficit: 18 mmol/L — ABNORMAL HIGH (ref 0.0–2.0)
Bicarbonate: 6.7 mEq/L — ABNORMAL LOW (ref 20.0–24.0)
O2 SAT: 98 %
PCO2 ART: 12.5 mmHg — AB (ref 35.0–45.0)
PH ART: 7.336 — AB (ref 7.350–7.450)
TCO2: 7 mmol/L (ref 0–100)
pO2, Arterial: 113 mmHg — ABNORMAL HIGH (ref 80.0–100.0)

## 2013-08-18 LAB — CORTISOL: Cortisol, Plasma: 62.8 ug/dL

## 2013-08-18 LAB — AMMONIA: AMMONIA: 130 umol/L — AB (ref 11–60)

## 2013-08-18 LAB — PROTIME-INR
INR: 1.9 — AB (ref 0.00–1.49)
INR: 1.71 — ABNORMAL HIGH (ref 0.00–1.49)
PROTHROMBIN TIME: 21.2 s — AB (ref 11.6–15.2)
Prothrombin Time: 19.6 seconds — ABNORMAL HIGH (ref 11.6–15.2)

## 2013-08-18 LAB — PROCALCITONIN: PROCALCITONIN: 0.23 ng/mL

## 2013-08-18 LAB — APTT: aPTT: 33 seconds (ref 24–37)

## 2013-08-18 LAB — TROPONIN I: Troponin I: <0.3 ng/mL (ref <0.30)

## 2013-08-18 LAB — RAPID URINE DRUG SCREEN, HOSP PERFORMED
Amphetamines: NOT DETECTED
BENZODIAZEPINES: NOT DETECTED
Barbiturates: NOT DETECTED
Cocaine: NOT DETECTED
Opiates: NOT DETECTED
TETRAHYDROCANNABINOL: NOT DETECTED

## 2013-08-18 LAB — I-STAT CG4 LACTIC ACID, ED: LACTIC ACID, VENOUS: 16.89 mmol/L — AB (ref 0.5–2.2)

## 2013-08-18 LAB — LACTIC ACID, PLASMA: Lactic Acid, Venous: 18.2 mmol/L — ABNORMAL HIGH (ref 0.5–2.2)

## 2013-08-18 LAB — LIPASE, BLOOD: LIPASE: 63 U/L — AB (ref 11–59)

## 2013-08-18 LAB — AMYLASE: Amylase: 73 U/L (ref 0–105)

## 2013-08-18 LAB — PRO B NATRIURETIC PEPTIDE: Pro B Natriuretic peptide (BNP): 51.7 pg/mL (ref 0–125)

## 2013-08-18 LAB — POC OCCULT BLOOD, ED: FECAL OCCULT BLD: POSITIVE — AB

## 2013-08-18 LAB — PREPARE RBC (CROSSMATCH)

## 2013-08-18 LAB — MAGNESIUM: Magnesium: 2.1 mg/dL (ref 1.5–2.5)

## 2013-08-18 SURGERY — EGD (ESOPHAGOGASTRODUODENOSCOPY)
Anesthesia: Moderate Sedation

## 2013-08-18 MED ORDER — SODIUM CHLORIDE 0.9 % IV SOLN
80.0000 mg | Freq: Once | INTRAVENOUS | Status: AC
  Administered 2013-08-18: 80 mg via INTRAVENOUS
  Filled 2013-08-18: qty 80

## 2013-08-18 MED ORDER — FENTANYL CITRATE 0.05 MG/ML IJ SOLN
INTRAMUSCULAR | Status: AC
  Administered 2013-08-18: 200 ug via INTRAVENOUS
  Filled 2013-08-18: qty 4

## 2013-08-18 MED ORDER — MIDAZOLAM HCL 2 MG/2ML IJ SOLN
INTRAMUSCULAR | Status: AC
  Filled 2013-08-18: qty 4

## 2013-08-18 MED ORDER — OCTREOTIDE ACETATE 100 MCG/ML IJ SOLN
100.0000 ug | Freq: Once | INTRAMUSCULAR | Status: AC
  Administered 2013-08-18: 100 ug via INTRAVENOUS
  Filled 2013-08-18: qty 1

## 2013-08-18 MED ORDER — PIPERACILLIN-TAZOBACTAM 3.375 G IVPB 30 MIN: 3.3750 g | Freq: Once | INTRAVENOUS | Status: DC

## 2013-08-18 MED ORDER — CIPROFLOXACIN IN D5W 400 MG/200ML IV SOLN
400.0000 mg | Freq: Two times a day (BID) | INTRAVENOUS | Status: DC
  Administered 2013-08-18 – 2013-08-19 (×3): 400 mg via INTRAVENOUS
  Filled 2013-08-18 (×5): qty 200

## 2013-08-18 MED ORDER — SODIUM CHLORIDE 0.9 % IV BOLUS (SEPSIS)
1000.0000 mL | Freq: Once | INTRAVENOUS | Status: AC
  Administered 2013-08-18: 1000 mL via INTRAVENOUS

## 2013-08-18 MED ORDER — DEXTROSE 5 % IV SOLN
2.0000 ug/min | INTRAVENOUS | Status: DC
  Administered 2013-08-18: 10 ug/min via INTRAVENOUS
  Filled 2013-08-18 (×3): qty 4

## 2013-08-18 MED ORDER — SODIUM BICARBONATE 8.4 % IV SOLN
INTRAVENOUS | Status: DC
  Administered 2013-08-19: 01:00:00 via INTRAVENOUS
  Filled 2013-08-18 (×2): qty 150

## 2013-08-18 MED ORDER — MIDAZOLAM HCL 5 MG/ML IJ SOLN
INTRAMUSCULAR | Status: AC
  Filled 2013-08-18: qty 1

## 2013-08-18 MED ORDER — VANCOMYCIN HCL 10 G IV SOLR
2000.0000 mg | Freq: Once | INTRAVENOUS | Status: AC
  Administered 2013-08-18: 2000 mg via INTRAVENOUS
  Filled 2013-08-18: qty 2000

## 2013-08-18 MED ORDER — SODIUM BICARBONATE 8.4 % IV SOLN
INTRAVENOUS | Status: AC
  Administered 2013-08-18: 50 meq via INTRAVENOUS
  Filled 2013-08-18: qty 50

## 2013-08-18 MED ORDER — LACTULOSE 10 GM/15ML PO SOLN
30.0000 g | Freq: Three times a day (TID) | ORAL | Status: DC
  Filled 2013-08-18: qty 45

## 2013-08-18 MED ORDER — ROCURONIUM BROMIDE 50 MG/5ML IV SOLN
70.0000 mg | Freq: Once | INTRAVENOUS | Status: AC
  Administered 2013-08-18: 70 mg via INTRAVENOUS

## 2013-08-18 MED ORDER — PROPOFOL BOLUS VIA INFUSION
0.5000 mg/kg | Freq: Once | INTRAVENOUS | Status: DC
Start: 2013-08-18 — End: 2013-08-18
  Filled 2013-08-18: qty 54

## 2013-08-18 MED ORDER — SODIUM CHLORIDE 0.9 % IV SOLN: 250.0000 mL | INTRAVENOUS | Status: DC | PRN

## 2013-08-18 MED ORDER — MIDAZOLAM HCL 2 MG/2ML IJ SOLN
INTRAMUSCULAR | Status: AC
  Administered 2013-08-18: 4 mg via INTRAVENOUS
  Filled 2013-08-18: qty 4

## 2013-08-18 MED ORDER — SODIUM CHLORIDE 0.9 % IV SOLN
INTRAVENOUS | Status: DC
  Administered 2013-08-21: 06:00:00 via INTRAVENOUS

## 2013-08-18 MED ORDER — MIDAZOLAM HCL 2 MG/2ML IJ SOLN
4.0000 mg | Freq: Once | INTRAMUSCULAR | Status: AC
  Administered 2013-08-18: 4 mg via INTRAVENOUS

## 2013-08-18 MED ORDER — FENTANYL CITRATE 0.05 MG/ML IJ SOLN
200.0000 ug | Freq: Once | INTRAMUSCULAR | Status: AC
  Administered 2013-08-18: 200 ug via INTRAVENOUS

## 2013-08-18 MED ORDER — FENTANYL CITRATE 0.05 MG/ML IJ SOLN: 100.0000 ug | INTRAMUSCULAR | Status: DC | PRN

## 2013-08-18 MED ORDER — PROPOFOL 10 MG/ML IV BOLUS
80.0000 mg | Freq: Once | INTRAVENOUS | Status: AC
  Administered 2013-08-18: 80 mg via INTRAVENOUS

## 2013-08-18 MED ORDER — FENTANYL CITRATE 0.05 MG/ML IJ SOLN
100.0000 ug | INTRAMUSCULAR | Status: DC | PRN
  Administered 2013-08-22 – 2013-08-23 (×8): 100 ug via INTRAVENOUS
  Filled 2013-08-18 (×9): qty 2

## 2013-08-18 MED ORDER — SODIUM CHLORIDE 0.9 % IV SOLN
25.0000 ug/h | INTRAVENOUS | Status: DC
  Administered 2013-08-18 – 2013-08-21 (×5): 25 ug/h via INTRAVENOUS
  Filled 2013-08-18 (×10): qty 1

## 2013-08-18 MED ORDER — SODIUM CHLORIDE 0.9 % IV SOLN
8.0000 mg/h | INTRAVENOUS | Status: AC
  Administered 2013-08-18 – 2013-08-21 (×6): 8 mg/h via INTRAVENOUS
  Filled 2013-08-18 (×12): qty 80

## 2013-08-18 MED ORDER — SODIUM BICARBONATE 8.4 % IV SOLN
50.0000 meq | Freq: Once | INTRAVENOUS | Status: AC
  Administered 2013-08-18: 50 meq via INTRAVENOUS
  Filled 2013-08-18: qty 50

## 2013-08-18 MED ORDER — PROPOFOL 10 MG/ML IV BOLUS
INTRAVENOUS | Status: AC
  Administered 2013-08-18: 80 mg via INTRAVENOUS
  Filled 2013-08-18: qty 20

## 2013-08-18 MED ORDER — PROPOFOL 10 MG/ML IV EMUL
5.0000 ug/kg/min | INTRAVENOUS | Status: DC
  Administered 2013-08-18: 5 ug/kg/min via INTRAVENOUS
  Administered 2013-08-19: 15 ug/kg/min via INTRAVENOUS
  Filled 2013-08-18: qty 100

## 2013-08-18 MED ORDER — FENTANYL CITRATE 0.05 MG/ML IJ SOLN
INTRAMUSCULAR | Status: AC
  Filled 2013-08-18: qty 2

## 2013-08-18 MED ORDER — PROPOFOL 10 MG/ML IV EMUL
INTRAVENOUS | Status: AC
  Administered 2013-08-18: 80 mg via INTRAVENOUS
  Filled 2013-08-18: qty 100

## 2013-08-18 MED ORDER — DEXTROSE 5 % IV SOLN
1.0000 g | Freq: Once | INTRAVENOUS | Status: AC
  Administered 2013-08-18: 1 g via INTRAVENOUS
  Filled 2013-08-18: qty 10

## 2013-08-18 NOTE — H&P (Signed)
PULMONARY  / CRITICAL CARE MEDICINE  Name: [name] [name] MRN: [id number] DOB: Sep 06, 1960 PCP Georgann Housekeeper, MD    ADMISSION DATE:  08/18/2013  LOS 0 days   CONSULTATION DATE:  08/18/2013    REFERRING MD :  Dr Radford Pax of ER  PRIMARY SERVICE: PCCM  CHIEF COMPLAINT:  GIB chronic with sever anemia, metabolic acidosis, lactic acidosis, hepatic encephalopathy  BRIEF PATIENT DESCRIPTION: see HPI  SIGNIFICANT EVENTS / STUDIES:  08/18/2013 - admit   LINES / TUBES: None  CULTURES: Results for orders placed during the hospital encounter of 07/06/13  MRSA PCR SCREENING     Status: None   Collection Time    07/07/13  3:15 AM      Result Value Ref Range Status   MRSA by PCR NEGATIVE  NEGATIVE Final   Comment:            The GeneXpert MRSA Assay (FDA     approved for NASAL specimens     only), is one component of a     comprehensive MRSA colonization     surveillance program. It is not     intended to diagnose MRSA     infection nor to guide or     monitor treatment for     MRSA infections.     ANTIBIOTICS: Anti-infectives   Start     Dose/Rate Route Frequency Ordered Stop   08/18/13 1630  cefTRIAXone (ROCEPHIN) 1 g in dextrose 5 % 50 mL IVPB     1 g 100 mL/hr over 30 Minutes Intravenous  Once 08/18/13 1624 08/18/13 1733   08/18/13 1615  vancomycin (VANCOCIN) 2,000 mg in sodium chloride 0.9 % 500 mL IVPB     2,000 mg 250 mL/hr over 120 Minutes Intravenous  Once 08/18/13 1611     08/18/13 1615  piperacillin-tazobactam (ZOSYN) IVPB 3.375 g  Status:  Discontinued     3.375 g 100 mL/hr over 30 Minutes Intravenous  Once 08/18/13 1611 08/18/13 1625       HISTORY OF PRESENT ILLNESS:  Hx from ER nurse and DR Radford Pax and chart biopsy   53 year old obese male with [place] documented Cirrhosis NOS (no ascites feb 2015), Hep CP, small esophageal varices and diverticulsos (per chart bx). Lives in halfway house apparently and alone. Called EMS because of feeling unwell. In ER noted  to be confused (high ammonia) though calm, severe lactic acidosis of 16, sever anemia of4gm% but without visible blood loss and maintaining hemodynamic and renal function. GI apparently wants patient intubated prior to endoscopy. PCCM admitting 08/18/2013 to ICU  Of note, admitted 07/06/13 to 07/09/13: with dark maroon stool. UGI scope by by drGAnem did not show source of bleed on 07/07/13 and there was no other evidence of portal htn other than small varices. Due to 2011 - colonoscopy diverticulosis it was felt he migh have had colonic diverticualr bleed   PAST MEDICAL HISTORY :  Past Medical History  Diagnosis Date  . Thrombocytopenia   . Cirrhosis   . Hepatitis C   . Hypertension   . Anxiety   . Anemia   . Blood transfusion   . Obesity   . Pancreatitis   . Bipolar disorder, unspecified 09/27/2012  . Depression 09/27/2012     Family History  Problem Relation Age of Onset  . Cancer - Other Mother     Uterine  . Coronary artery disease Father   . Coronary artery disease Paternal Grandfather   . Hypertension Father   .  Hypertension Paternal Grandfather      History   Social History  . Marital Status: Married    Spouse Name: N/A    Number of Children: N/A  . Years of Education: N/A   Occupational History  . Not on file.   Social History Main Topics  . Smoking status: Former Smoker    Types: Cigarettes    Quit date: 11/05/2003  . Smokeless tobacco: Not on file  . 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 states last etoh was 2011  . Drug Use: Yes    Special: Cocaine, Hydrocodone, Benzodiazepines     Comment: opiates   . Sexual Activity: No   Other Topics Concern  . Not on file   Social History Narrative  . No narrative on file     Allergies  Allergen Reactions  . Trileptal [Oxcarbazepine] Other (See Comments)    Sleep walking & hallucinations      (Not in an outpatient encounter)     REVIEW OF SYSTEMS:   unelicitable due to delirium.No family available  SUBJECTIVE:   VITAL SIGNS: Filed Vitals:   08/18/13 1700 08/18/13 1705 08/18/13 1715 08/18/13 1730  BP: 132/68 129/55 126/57 104/47  Pulse: 115  113 113  Temp: 97 F (36.1 C)  97 F (36.1 C) 97 F (36.1 C)  TempSrc: Other (Comment)  Core (Comment)   Resp: 26  22 26   SpO2: 100%  100% 100%      HEMODYNAMICS:   VENTILATOR SETTINGS: Vent Mode:  [-] PRVC FiO2 (%):  [30 %] 30 % Set Rate:  [28 bmp] 28 bmp Vt Set:  [420 mL] 420 mL PEEP:  [5 cmH20] 5 cmH20 Plateau Pressure:  [4 cmH20-21 cmH20] 20 cmH20 INTAKE / OUTPUT:       PHYSICAL EXAMINATION: General:  Pale obese male. Looks unwell Neuro:  Hypoactive delirium +. No focal deficits  HEENT:  NEck supple, PEERL + Cardiovascular:  Tachycardic Lungs:  CTA bilaterally. Maintaining airway Abdomen:  Obese, soft, non tender Musculoskeletal:  No cyanosis, no clubbing, no edema Skin:  intact  LABS: PULMONARY  Recent Labs Lab 08/18/13 1554  PHART 7.336*  PCO2ART 12.5*  PO2ART 113.0*  HCO3 6.7*  TCO2 7  O2SAT 98.0    CBC  Recent Labs Lab 08/18/13 1540  HGB 4.8*  HCT 17.5*  WBC 17.7*  PLT 212    COAGULATION No results found for this basename: INR,  in the last 168 hours  CARDIAC  No results found for this basename: TROPONINI,  in the last 168 hours No results found for this basename: PROBNP,  in the last 168 hours   CHEMISTRY  Recent Labs Lab 08/18/13 1540  NA 147  K 4.9  CL 107  CO2 <7*  GLUCOSE 159*  BUN 51*  CREATININE 1.09  CALCIUM 8.7   The CrCl is unknown because both a height and weight (above a minimum accepted value) are required for this calculation.   LIVER  Recent Labs Lab 08/18/13 1540  AST 102*  ALT 48  ALKPHOS 51  BILITOT 1.1  PROT 5.8*  ALBUMIN 2.8*     INFECTIOUS  Recent Labs Lab 08/18/13 1534  LATICACIDVEN 16.89*     ENDOCRINE CBG (last 3)  No results found for this basename: GLUCAP,  in the last 72  hours       IMAGING x48h  Dg Chest Port 1 View  08/18/2013   CLINICAL DATA:  Sepsis, confusion, hypotension  EXAM: PORTABLE CHEST - 1 VIEW  COMPARISON:  09/27/2012  FINDINGS: Cardiomediastinal silhouette is stable. No acute infiltrate or pleural effusion. No pulmonary edema. Bony thorax is unremarkable.  IMPRESSION: No active disease.   Electronically Signed   By: Natasha Mead M.D.   On: 08/18/2013 16:43       ASSESSMENT / PLAN:  PULMONARY A: AT risk for intubation. AT present maintains airway. Likely needs elective intubation prior to GI scope P:   Await GI to time intubation  CARDIOVASCULAR A: Maintaining bp despite severe anemia - suggests sub acute blood loss. AT risk for MI P:  Rule out MI Monitor bp/hr  RENAL A:  Severe lactic acidosis - ? Due to blood loss P:   Hydrate Transfuse blood Check lipase, amylase Monitor No indication for bicarb gtt   GASTROINTESTINAL A:  Cirrhosis NOS Child Class  GI bleed ? Recurrent diverticular P:   Start lactulose octreotide and protonix gtt per er   HEMATOLOGIC A:  seveer blood loss anemia P:  prbc for hgb > 7gm%  INFECTIOUS A:  No evidence of sepsis P:   Culture Sepsis biomarkers  ENDOCRINE A: nil acute P:   monitor  NEUROLOGIC A:  Acute enceph likely liver source P:   Start lactulose  TODAY'S SUMMARY: no family at bedside. D/w Dr Bosie Clos  -patient meets indicaton for upper endocscopy. Needs intubation. However, high risk due to acidosis but benefit outweighs risk and needs to be done           The patient is critically ill with multiple organ systems failure and requires high complexity decision making for assessment and support, frequent evaluation and titration of therapies, application of advanced monitoring technologies and extensive interpretation of multiple databases.   Critical Care Time devoted to patient care services described in this note is  45  Minutes.  Dr. Kalman Shan, M.D.,  [facility].C.P Pulmonary and Critical Care Medicine Staff Physician [facility] [place] Pager: [phone], If no answer or between  15:00h - 7:00h: call [phone]  08/18/2013 6:12 PM

## 2013-08-18 NOTE — H&P (View-Only) (Signed)
Referring Provider: Dr. Radford Pax Primary Care Physician:  [name] Housekeeper, MD   Reason for Consultation:  Melena  HPI: [name] [name] is a 53 y.o. male with history of Hep C and alcohol cirrhosis who presents with a history of GI bleed with severe anemia and report of melena (unwitnessed). Patient is confused and unable to give any history. Hgb 4.8 (7.5 on 07/10/13). Acidotic (Lactic acid 16.89). No family present. Patient reportedly called EMS because he felt sick. No melena or hematochezia in ER and no report of hematemesis. He had a GI in 2/15 that was thought to be diverticular and EGD at that time showed tiny esophageal varices, portal hypertensive gastropathy, and gastritis without any active bleeding. Patient unable to tell me when he last had alcohol. He does report epigastric pain after feeling tender during my examination but cannot tell me any history regarding it.   Past Medical History  Diagnosis Date  . Thrombocytopenia   . Cirrhosis   . Hepatitis C   . Hypertension   . Anxiety   . Anemia   . Blood transfusion   . Obesity   . Pancreatitis   . Bipolar disorder, unspecified 09/27/2012  . Depression 09/27/2012    Past Surgical History  Procedure Laterality Date  . Tonsillectomy    . Esophagogastroduodenoscopy N/A 01/05/2013    Procedure: ESOPHAGOGASTRODUODENOSCOPY (EGD);  Surgeon: [name] [name]., MD;  Location: [facility] ENDOSCOPY;  Service: Endoscopy;  Laterality: N/A;  . Esophagogastroduodenoscopy N/A 07/07/2013    Procedure: ESOPHAGOGASTRODUODENOSCOPY (EGD);  Surgeon: [name] Shiver, MD;  Location: [facility] ENDOSCOPY;  Service: Endoscopy;  Laterality: N/A;    Prior to Admission medications   Medication Sig Start Date End Date Taking? Authorizing Provider  acetaminophen (TYLENOL) 325 MG tablet Take 650 mg by mouth every 6 (six) hours as needed for pain. For pain    Historical Provider, MD  Ferrous Sulfate (IRON) 325 (65 FE) MG TABS Take 1 tablet by mouth 2 (two) times daily. 07/09/13    [name] Mountain, MD  haloperidol (HALDOL) 5 MG tablet  06/08/13   Historical Provider, MD  pantoprazole (PROTONIX) 40 MG tablet Take 1 tablet (40 mg total) by mouth daily. 01/06/13   [name] Housekeeper, MD  pantoprazole (PROTONIX) 40 MG tablet Take 1 tablet (40 mg total) by mouth daily. 07/09/13   [name] Mountain, MD  propranolol (INDERAL) 20 MG tablet Take 1 tablet (20 mg total) by mouth 2 (two) times daily. 07/09/13   [name] Mountain, MD  traMADol (ULTRAM) 50 MG tablet Take by mouth every 6 (six) hours as needed for moderate pain.    Historical Provider, MD    Scheduled Meds: . lactulose  30 g Oral TID   Continuous Infusions: . sodium chloride    . sodium chloride    . octreotide (SANDOSTATIN) infusion 25 mcg/hr (08/18/13 1801)  . vancomycin     PRN Meds:.sodium chloride  Allergies as of 08/18/2013 - Review Complete 07/07/2013  Allergen Reaction Noted  . Trileptal [oxcarbazepine] Other (See Comments) 09/27/2012    Family History  Problem Relation Age of Onset  . Cancer - Other Mother     Uterine  . Coronary artery disease Father   . Coronary artery disease Paternal Grandfather   . Hypertension Father   . Hypertension Paternal Grandfather     History   Social History  . Marital Status: Married    Spouse Name: N/A    Number of Children: N/A  . Years of Education: N/A  Occupational History  . Not on file.   Social History Main Topics  . Smoking status: Former Smoker    Types: Cigarettes    Quit date: 11/05/2003  . Smokeless tobacco: Not on file  . 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 states last etoh was 2011  . Drug Use: Yes    Special: Cocaine, Hydrocodone, Benzodiazepines     Comment: opiates   . Sexual Activity: No   Other Topics Concern  . Not on file   Social History Narrative  . No narrative on file    Review of Systems: Unable to obtain  Physical Exam: Vital signs: Filed Vitals:   08/18/13  1905  BP: 119/84  Pulse: 122  Temp: 97  Resp: 25     General:   Somnolent, pale, acute distress, obese HEENT: anicteric Neck: supple Lungs:  Coarse breath sounds . Heart:  Tachycardic Abdomen: diffuse tenderness, +distention, decreased BS  Rectal:  Deferred Skin: pale    GI:  Lab Results:  Recent Labs  08/18/13 1540 08/18/13 1847  WBC 17.7* 22.3*  HGB 4.8* 4.9*  HCT 17.5* 18.3*  PLT 212 230   BMET  Recent Labs  08/18/13 1540  NA 147  K 4.9  CL 107  CO2 <7*  GLUCOSE 159*  BUN 51*  CREATININE 1.09  CALCIUM 8.7   LFT  Recent Labs  08/18/13 1540  PROT 5.8*  ALBUMIN 2.8*  AST 102*  ALT 48  ALKPHOS 51  BILITOT 1.1   PT/INR  Recent Labs  08/18/13 1815  LABPROT 21.2*  INR 1.90*     Studies/Results: Dg Chest Port 1 View  08/18/2013   CLINICAL DATA:  Sepsis, confusion, hypotension  EXAM: PORTABLE CHEST - 1 VIEW  COMPARISON:  09/27/2012  FINDINGS: Cardiomediastinal silhouette is stable. No acute infiltrate or pleural effusion. No pulmonary edema. Bony thorax is unremarkable.  IMPRESSION: No active disease.   Electronically Signed   By: [name] MeadLiviu  [name] M.D.   On: 08/18/2013 16:43    Impression/Plan: 53 yo with severe anemia and acidosis with a report of melena. Patient is confused that is likely due to acidosis and hepatic encephalopathy. Most likely he has been noncompliant with his home Lactulose. Needs a bedside EGD and intubation prior to EGD for airway protection. If no source of blood loss seen, then he may need a CT of the abdomen due to leucocytosis and acidosis with severe anemia to look for retroperitoneal hematoma.    LOS: 0 days   [name] [name] C.  08/18/2013, 7:18 PM

## 2013-08-18 NOTE — ED Notes (Signed)
[facility] . Pt is a recovering ETOH user . Last drank 02-2013. Pt Unable to give info due to condition. Possible dehydration . Pt alert to name and birthdate.

## 2013-08-18 NOTE — Procedures (Addendum)
Central Venous Catheter Insertion Procedure Note [name] PandaDavid [name] [id number] 23-Feb-1961  Procedure: Insertion of Central Venous Catheter Indications: Assessment of intravascular volume, Drug and/or fluid administration and Frequent blood sampling  Procedure Details Consent: Unable to obtain consent because of emergent medical necessity. Time Out: Verified patient identification, verified procedure, site/side was marked, verified correct patient position, special equipment/implants available, medications/allergies/relevent history reviewed, required imaging and test results available.  Performed  Maximum sterile technique was used including antiseptics, cap, gloves, gown, hand hygiene, mask and sheet. Skin prep: Chlorhexidine; local anesthetic administered A antimicrobial bonded/coated triple lumen catheter was placed in the right internal jugular vein using the Seldinger technique.  Evaluation Blood flow good Complications: No apparent complications Patient did tolerate procedure well. Chest X-ray ordered to verify placement.  In good position.  [name] [name] R.  I used ultrasound to locate and access the vein/artery.  08/18/2013, 8:36 PM

## 2013-08-18 NOTE — Progress Notes (Signed)
eLink Physician-Brief Progress Note Patient Name: [name] PandaDavid Hord DOB: 08-Jan-1961 MRN: [id number]  Date of Service  08/18/2013   HPI/Events of Note   metab acidosis Expect lactate to clear  eICU Interventions  Add bicarb gtt   Intervention Category Major Interventions: Acid-Base disturbance - evaluation and management  Clemmie Marxen V. 08/18/2013, 11:46 PM

## 2013-08-18 NOTE — ED Provider Notes (Signed)
CSN: [id number]     Arrival date & time 08/18/13  1509 History   First MD Initiated Contact with Patient 08/18/13 1533     Chief Complaint  Patient presents with  . Altered Mental Status  . Hypotension    HPI [name] with hepatic cirrhosis secondary to hep C and alcohol abuse presents with GI bleed. The patient resides at a halfway house.  He has a long history of alcohol abuse.  Has hepatic cirrhosis.  He reportedly called EMS.  History is limited.  The patient is unable to provide any history due to altered mental status and acuity of condition.  EMS can provide no history, other than that he is tachycardic, very tachypneic, and ill appearing.    Brief review of records demonstrates that he has a history of GI bleed a few months ago.  No definite source was found.  He did have some small non-bleeding varices at that time.  Rectal exam demonstrates gross melena, heme positive.  Hb is 4.   Past Medical History  Diagnosis Date  . Thrombocytopenia   . Cirrhosis   . Hepatitis C   . Hypertension   . Anxiety   . Anemia   . Blood transfusion   . Obesity   . Pancreatitis   . Bipolar disorder, unspecified 09/27/2012  . Depression 09/27/2012   Past Surgical History  Procedure Laterality Date  . Tonsillectomy    . Esophagogastroduodenoscopy N/A 01/05/2013    Procedure: ESOPHAGOGASTRODUODENOSCOPY (EGD);  Surgeon: Vertell Novak., MD;  Location: [facility] ENDOSCOPY;  Service: Endoscopy;  Laterality: N/A;  . Esophagogastroduodenoscopy N/A 07/07/2013    Procedure: ESOPHAGOGASTRODUODENOSCOPY (EGD);  Surgeon: Graylin Shiver, MD;  Location: [facility] ENDOSCOPY;  Service: Endoscopy;  Laterality: N/A;   Family History  Problem Relation Age of Onset  . Cancer - Other Mother     Uterine  . Coronary artery disease Father   . Coronary artery disease Paternal Grandfather   . Hypertension Father   . Hypertension Paternal Grandfather    History  Substance Use Topics  . Smoking status: Former Smoker     Types: Cigarettes    Quit date: 11/05/2003  . Smokeless tobacco: Not on file  . 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 states last etoh was 2011    Review of Systems  Unable to perform ROS: Mental status change      Allergies  Trileptal  Home Medications   No current outpatient prescriptions on file. BP 105/58  Pulse 116  Temp(Src) 98.3 F (36.8 C) (Core (Comment))  Resp 26  Ht 6' 5.17" (1.96 m)  Wt 295 lb 6.7 oz (134 kg)  BMI 34.88 kg/m2  SpO2 100% Physical Exam  Nursing note and vitals reviewed. Constitutional:  Toxic appearing, lethargic, disoriented, very tachypneic, pale.  HENT:  Head: Normocephalic and atraumatic.  Mouth/Throat: Oropharynx is clear and moist.  No blood in oropharynx  Eyes: Pupils are equal, round, and reactive to light. No scleral icterus.  Pale conjunctiva  Neck: Normal range of motion. Neck supple. No JVD present. No tracheal deviation present.  Cardiovascular: Regular rhythm, normal heart sounds and intact distal pulses.  Exam reveals no gallop and no friction rub.   No murmur heard. tachycardia  Pulmonary/Chest: No stridor. Tachypnea noted. He is in respiratory distress. He has no decreased breath sounds. He has no wheezes. He has no rhonchi. He has no rales.  Normal O2 sat on  non-rebreather  Abdominal: Soft. He exhibits no distension. There is no tenderness. There is no rebound and no guarding.  Genitourinary: Guaiac positive stool.  Gross melena rectal exam  Musculoskeletal: He exhibits no edema.  Neurological:  Lethargic, GCS 11, follows commands, moves all extremities equally with 5/5 strength, PERRL  Skin: Skin is dry. There is pallor.    ED Course  Procedures (including critical care time) Labs Review Labs Reviewed  CBC - Abnormal; Notable for the following:    WBC 17.7 (*)    RBC 2.56 (*)    Hemoglobin 4.8 (*)    HCT 17.5 (*)    MCV 68.4 (*)    MCH 18.8 (*)    MCHC 27.4 (*)     RDW 20.2 (*)    All other components within normal limits  COMPREHENSIVE METABOLIC PANEL - Abnormal; Notable for the following:    CO2 <7 (*)    Glucose, Bld 159 (*)    BUN 51 (*)    Total Protein 5.8 (*)    Albumin 2.8 (*)    AST 102 (*)    GFR calc non Af Amer 77 (*)    GFR calc Af Amer 89 (*)    All other components within normal limits  URINALYSIS, ROUTINE W REFLEX MICROSCOPIC - Abnormal; Notable for the following:    Ketones, ur 15 (*)    All other components within normal limits  AMMONIA - Abnormal; Notable for the following:    Ammonia 130 (*)    All other components within normal limits  PROTIME-INR - Abnormal; Notable for the following:    Prothrombin Time 21.2 (*)    INR 1.90 (*)    All other components within normal limits  LIPASE, BLOOD - Abnormal; Notable for the following:    Lipase 63 (*)    All other components within normal limits  LACTIC ACID, PLASMA - Abnormal; Notable for the following:    Lactic Acid, Venous 18.2 (*)   GLUCOSE, CAPILLARY - Abnormal; Notable for the following:   I-STAT ARTERIAL BLOOD GAS, ED - Abnormal; Notable for the following:    pH, Arterial 7.336 (*)    pCO2 arterial 12.5 (*)    pO2, Arterial 113.0 (*)    Bicarbonate 6.7 (*)    Acid-base deficit 18.0 (*)    All other components within normal limits  POC OCCULT BLOOD, ED - Abnormal; Notable for the following:    Fecal Occult Bld POSITIVE (*)    All other components within normal limits  CULTURE, BLOOD (ROUTINE X 2)  CULTURE, BLOOD (ROUTINE X 2)   Imaging Review Dg Chest Port 1 View  08/18/2013   CLINICAL DATA:  Sepsis, confusion, hypotension  EXAM: PORTABLE CHEST - 1 VIEW  COMPARISON:  09/27/2012  FINDINGS: Cardiomediastinal silhouette is stable. No acute infiltrate or pleural effusion. No pulmonary edema. Bony thorax is unremarkable.  IMPRESSION: No active disease.   Electronically Signed   By: Natasha Mead M.D.   On: 08/18/2013 16:43     EKG Interpretation None      MDM    [name] with cirrhosis presents with severe GI bleed.  Gross melena.  Hb 4.  Compensated shock with tachycardia to 120s, tachypnea to 30, but normotensive.  Lactate 18, shock in the state of poor clearance due to cirrhosis.  Profound metabolic acidosis with respiratory compensation.    In the ED, gave 2+ L crystalloid. Obtained blood cx X 2.  Gave IV rocephin. Gave 3  U PRBC, emergent release. Gave 1 U FFP. Gave IV octreotide bolus and infusion. Gave Protonix bolus. Placed multiple US guided IVs.  Will hold off on intubation for now, as patient at risk for peri-intubation arrest. Consulted GI; they will plan to scope the patient emergently when stabilized. Consulted critical care.  Patient admitted to ICU for further mgt.  Final diagnoses:  Lactic acidosis  Acute encephalopathy  Acute posthemorrhagic anemia  Cirrhosis of liver without mention of alcohol  Gastrointestinal hemorrhage  Hepatic encephalopathy      Toney SangJerrid Benedicta Sultan, MD 08/19/13 1313

## 2013-08-18 NOTE — Brief Op Note (Addendum)
Esophageal variceal bleed - s/p variceal band ligation X 4. Large amount of red blood and dark maroon clots in dependent portion of the stomach. Blood was aspirated leaving a medium-sized clot that could not be suctioned or moved preventing complete visualization of the fundus. Mild portal hypertensive gastropathy. No gastric varices seen. No bleeding source seen in stomach. Continue Octreotide drip. IV Abx. Protonix drip. Levophed started during the procedure due to hypotension. After 3rd unit of blood transfused would check H/H with goal HCT not to exceed 23 due to recent variceal bleed. Would NOT insert any OG or NG tubes. See endopro note for details.

## 2013-08-18 NOTE — Op Note (Signed)
[facility] [address] [place] [id number]   ENDOSCOPY PROCEDURE REPORT  PATIENT: [name] [name], [name] [name]  MR#: [id number] BIRTHDATE: 1960/11/06 , 52  yrs. old GENDER: Male  ENDOSCOPIST: Charlott RakesVincent Bailea Beed, MD REFERRED GN:[id number]:hospital team  PROCEDURE DATE:  08/18/2013 PROCEDURE:   EGD w/ band ligation of varices ASA CLASS:   Class IV INDICATIONS:Hematemesis. MEDICATIONS: Per RN report   (ICU - Propofol drip infusing following intubation)  Patient intubated prior to procedure taking place for airway protection TOPICAL ANESTHETIC:  DESCRIPTION OF PROCEDURE:   After the risks benefits and alternatives of the procedure were thoroughly explained, informed consent was obtained.  The Pentax Gastroscope [id number]  endoscope was introduced through the mouth and advanced to the second portion of the duodenum , limited by Without limitations.   The instrument was slowly withdrawn as the mucosa was fully examined.     FINDINGS: The endoscope was inserted into the oropharynx and esophagus was intubated. Medium-sized esophageal varices were seen in the mid-esophagus and extending to the distal esophagus to the GEJ. The gastroesophageal junction was noted to be 45 cm from the incisors. No active bleeding was seen from the varices. Endoscope was advanced into the stomach and a large amount of red blood and dark maroon clots were noted in the dependent portion of the stomach.  The endoscope was advanced to the duodenal bulb and second portion of duodenum which were unremarkable and revealed clear to slightly cloudy bilious fluid.  The endoscope was withdrawn back into the stomach and retroflexion showed a limited view of the proximal stomach due to the large amount of blood that was seen. The esophageal varices were inspected further and there were two focal area that were raised with a red wale appearance. No blood seen in the esophagus at insertion. The blood in the  proximal stomach was aspirated as much as possible leaving a blood clot that could not be suctioned and no bleeding seen from the gastric cardia. A part of the fundus could not be visualized due to this blood clot that remained. No gastric varices were seen. The esophageal varices were again noted and the red wale spots were seen again without active bleeding. The esophageal varices were in 3 columns and extended from 26 cm - 45 cm from the incisors. The endoscope was removed and the band ligator was attached in the usual fashion and reinserted. Four bands were successfully deployed onto the varices without complications. One band was deployed unsuccessfully but was banded successfully with another band (1 of the 4).  COMPLICATIONS: None  ENDOSCOPIC IMPRESSION:     Variceal bleed - s/p Esophageal Variceal Band Ligation X 4 Mild portal hypertension gastropathy  RECOMMENDATIONS: Continue Octreotide drip; Protonix drip; IV Abx; Supportive care   REPEAT EXAM: N/A  _______________________________ Charlott RakesVincent Laith Antonelli, MD eSigned:  Charlott RakesVincent Zygmunt Mcglinn, MD 08/18/2013 10:45 PM    CC:  PATIENT NAME:  [name] [name], [name] [name] MR#: [id number]

## 2013-08-18 NOTE — Procedures (Addendum)
Intubation Procedure Note [name] PandaDavid [name] [id number] [birth year]  Procedure: Intubation Indications: Airway protection and maintenance  Procedure Details Consent: Unable to obtain consent because of emergent medical necessity. Time Out: Verified patient identification, verified procedure, site/side was marked, verified correct patient position, special equipment/implants available, medications/allergies/relevent history reviewed, required imaging and test results available.  Performed  Maximum sterile technique was used including gloves and mask.   Glidescope 4 used. Unable to adequately visualize with 3. 3 attempts total. Initial procedure aborted 2/2 IV infiltration (see accompanying note). 2nd procedure induced with 4 mg of Versed and 200 mcg of Fentanyl. Paralyzed with 80 mg rocuronium. Grade 1 view with 4 blade. Tube secured at 23 cm. Advanced to 24 based on X-ray.    Evaluation Hemodynamic Status: BP stable throughout; O2 sats: stable throughout Patient's Current Condition: stable Complications: No apparent complications Patient did tolerate procedure well. Chest X-ray ordered to verify placement.  CXR: tube position acceptable.   Pascuala Klutts R. 08/18/2013

## 2013-08-18 NOTE — Interval H&P Note (Signed)
History and Physical Interval Note:  08/18/2013 9:57 PM  [name] [name]  has presented today for surgery, with the diagnosis of GI bleed  The various methods of treatment have been discussed with the patient and family. After consideration of risks, benefits and other options for treatment, the patient has consented to  Procedure(s): ESOPHAGOGASTRODUODENOSCOPY (EGD) (N/A) as a surgical intervention .  The patient's history has been reviewed, patient examined, no change in status, stable for surgery.  I have reviewed the patient's chart and labs.  Questions were answered to the patient's satisfaction.     Amedee Cerrone C.

## 2013-08-18 NOTE — Consult Note (Signed)
Referring Provider: Dr. Radford Pax Primary Care Physician:  Georgann Housekeeper, MD   Reason for Consultation:  Melena  HPI: [name] [name] is a 53 y.o. male with history of Hep C and alcohol cirrhosis who presents with a history of GI bleed with severe anemia and report of melena (unwitnessed). Patient is confused and unable to give any history. Hgb 4.8 (7.5 on 07/10/13). Acidotic (Lactic acid 16.89). No family present. Patient reportedly called EMS because he felt sick. No melena or hematochezia in ER and no report of hematemesis. He had a GI in 2/15 that was thought to be diverticular and EGD at that time showed tiny esophageal varices, portal hypertensive gastropathy, and gastritis without any active bleeding. Patient unable to tell me when he last had alcohol. He does report epigastric pain after feeling tender during my examination but cannot tell me any history regarding it.   Past Medical History  Diagnosis Date  . Thrombocytopenia   . Cirrhosis   . Hepatitis C   . Hypertension   . Anxiety   . Anemia   . Blood transfusion   . Obesity   . Pancreatitis   . Bipolar disorder, unspecified 09/27/2012  . Depression 09/27/2012    Past Surgical History  Procedure Laterality Date  . Tonsillectomy    . Esophagogastroduodenoscopy N/A 01/05/2013    Procedure: ESOPHAGOGASTRODUODENOSCOPY (EGD);  Surgeon: Vertell Novak., MD;  Location: [facility] ENDOSCOPY;  Service: Endoscopy;  Laterality: N/A;  . Esophagogastroduodenoscopy N/A 07/07/2013    Procedure: ESOPHAGOGASTRODUODENOSCOPY (EGD);  Surgeon: Graylin Shiver, MD;  Location: [facility] ENDOSCOPY;  Service: Endoscopy;  Laterality: N/A;    Prior to Admission medications   Medication Sig Start Date End Date Taking? Authorizing Provider  acetaminophen (TYLENOL) 325 MG tablet Take 650 mg by mouth every 6 (six) hours as needed for pain. For pain    Historical Provider, MD  Ferrous Sulfate (IRON) 325 (65 FE) MG TABS Take 1 tablet by mouth 2 (two) times daily. 07/09/13    Lillia Mountain, MD  haloperidol (HALDOL) 5 MG tablet  06/08/13   Historical Provider, MD  pantoprazole (PROTONIX) 40 MG tablet Take 1 tablet (40 mg total) by mouth daily. 01/06/13   Georgann Housekeeper, MD  pantoprazole (PROTONIX) 40 MG tablet Take 1 tablet (40 mg total) by mouth daily. 07/09/13   Lillia Mountain, MD  propranolol (INDERAL) 20 MG tablet Take 1 tablet (20 mg total) by mouth 2 (two) times daily. 07/09/13   Lillia Mountain, MD  traMADol (ULTRAM) 50 MG tablet Take by mouth every 6 (six) hours as needed for moderate pain.    Historical Provider, MD    Scheduled Meds: . lactulose  30 g Oral TID   Continuous Infusions: . sodium chloride    . sodium chloride    . octreotide (SANDOSTATIN) infusion 25 mcg/hr (08/18/13 1801)  . vancomycin     PRN Meds:.sodium chloride  Allergies as of 08/18/2013 - Review Complete 07/07/2013  Allergen Reaction Noted  . Trileptal [oxcarbazepine] Other (See Comments) 09/27/2012    Family History  Problem Relation Age of Onset  . Cancer - Other Mother     Uterine  . Coronary artery disease Father   . Coronary artery disease Paternal Grandfather   . Hypertension Father   . Hypertension Paternal Grandfather     History   Social History  . Marital Status: Married    Spouse Name: N/A    Number of Children: N/A  . Years of Education: N/A  Occupational History  . Not on file.   Social History Main Topics  . Smoking status: Former Smoker    Types: Cigarettes    Quit date: 11/05/2003  . Smokeless tobacco: Not on file  . 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 states last etoh was 2011  . Drug Use: Yes    Special: Cocaine, Hydrocodone, Benzodiazepines     Comment: opiates   . Sexual Activity: No   Other Topics Concern  . Not on file   Social History Narrative  . No narrative on file    Review of Systems: Unable to obtain  Physical Exam: Vital signs: Filed Vitals:   08/18/13  1905  BP: 119/84  Pulse: 122  Temp: 97  Resp: 25     General:   Somnolent, pale, acute distress, obese HEENT: anicteric Neck: supple Lungs:  Coarse breath sounds . Heart:  Tachycardic Abdomen: diffuse tenderness, +distention, decreased BS  Rectal:  Deferred Skin: pale    GI:  Lab Results:  Recent Labs  08/18/13 1540 08/18/13 1847  WBC 17.7* 22.3*  HGB 4.8* 4.9*  HCT 17.5* 18.3*  PLT 212 230   BMET  Recent Labs  08/18/13 1540  NA 147  K 4.9  CL 107  CO2 <7*  GLUCOSE 159*  BUN 51*  CREATININE 1.09  CALCIUM 8.7   LFT  Recent Labs  08/18/13 1540  PROT 5.8*  ALBUMIN 2.8*  AST 102*  ALT 48  ALKPHOS 51  BILITOT 1.1   PT/INR  Recent Labs  08/18/13 1815  LABPROT 21.2*  INR 1.90*     Studies/Results: Dg Chest Port 1 View  08/18/2013   CLINICAL DATA:  Sepsis, confusion, hypotension  EXAM: PORTABLE CHEST - 1 VIEW  COMPARISON:  09/27/2012  FINDINGS: Cardiomediastinal silhouette is stable. No acute infiltrate or pleural effusion. No pulmonary edema. Bony thorax is unremarkable.  IMPRESSION: No active disease.   Electronically Signed   By: Natasha MeadLiviu  Pop M.D.   On: 08/18/2013 16:43    Impression/Plan: 53 yo with severe anemia and acidosis with a report of melena. Patient is confused that is likely due to acidosis and hepatic encephalopathy. Most likely he has been noncompliant with his home Lactulose. Needs a bedside EGD and intubation prior to EGD for airway protection. If no source of blood loss seen, then he may need a CT of the abdomen due to leucocytosis and acidosis with severe anemia to look for retroperitoneal hematoma.    LOS: 0 days   Zianna Dercole C.  08/18/2013, 7:18 PM

## 2013-08-18 NOTE — OR Nursing (Signed)
Pt with dementia, intubated, sedated, and unable to sign consent.  Egd performed in ICU 2M16.  Esophageal varices banded.  [facility].  Vital Signs on ICU record.

## 2013-08-18 NOTE — Progress Notes (Addendum)
Patient admitted to MICU with one peripheral IV. Intubation attempted on arrival in anticipation of endoscopy. IV infiltrated after sedatives (Propofol, Fentanyl, Versed) so initial attempt aborted. Unable to obtain peripheral IV so urgent central line placed under US guidance. During peripheral IV placement attempts patient began retching but did not vomit. Became more sedated during central line placement but protected airway. Dynamic collapse of SVC during procedure noted. IV running wide open. Patient to get one more unit of blood.

## 2013-08-18 NOTE — ED Notes (Signed)
Dr. Radford PaxBeaton notified of CG-4 result

## 2013-08-19 ENCOUNTER — Inpatient Hospital Stay (HOSPITAL_COMMUNITY): Payer: Medicare Other

## 2013-08-19 DIAGNOSIS — R579 Shock, unspecified: Secondary | ICD-10-CM

## 2013-08-19 DIAGNOSIS — B1710 Acute hepatitis C without hepatic coma: Secondary | ICD-10-CM

## 2013-08-19 DIAGNOSIS — J9601 Acute respiratory failure with hypoxia: Secondary | ICD-10-CM

## 2013-08-19 LAB — PREPARE FRESH FROZEN PLASMA
UNIT DIVISION: 0
Unit division: 0
Unit division: 0

## 2013-08-19 LAB — POCT I-STAT 3, ART BLOOD GAS (G3+)
Acid-base deficit: 17 mmol/L — ABNORMAL HIGH (ref 0.0–2.0)
Acid-base deficit: 12 mmol/L — ABNORMAL HIGH (ref 0.0–2.0)
Acid-base deficit: 8 mmol/L — ABNORMAL HIGH (ref 0.0–2.0)
BICARBONATE: 15.2 meq/L — AB (ref 20.0–24.0)
Bicarbonate: 9 mEq/L — ABNORMAL LOW (ref 20.0–24.0)
Bicarbonate: 12.6 mEq/L — ABNORMAL LOW (ref 20.0–24.0)
O2 SAT: 99 %
O2 SAT: 100 %
O2 Saturation: 100 %
PCO2 ART: 22.8 mmHg — AB (ref 35.0–45.0)
PCO2 ART: 21.7 mmHg — AB (ref 35.0–45.0)
PH ART: 7.204 — AB (ref 7.350–7.450)
PO2 ART: 185 mmHg — AB (ref 80.0–100.0)
PO2 ART: 184 mmHg — AB (ref 80.0–100.0)
Patient temperature: 98.6
Patient temperature: 98.6
Patient temperature: 101.5
TCO2: 10 mmol/L (ref 0–100)
TCO2: 13 mmol/L (ref 0–100)
TCO2: 16 mmol/L (ref 0–100)
pCO2 arterial: 22.4 mmHg — ABNORMAL LOW (ref 35.0–45.0)
pH, Arterial: 7.372 (ref 7.350–7.450)
pH, Arterial: 7.444 (ref 7.350–7.450)
pO2, Arterial: 166 mmHg — ABNORMAL HIGH (ref 80.0–100.0)

## 2013-08-19 LAB — MAGNESIUM: Magnesium: 2 mg/dL (ref 1.5–2.5)

## 2013-08-19 LAB — CBC
HCT: 16.7 % — ABNORMAL LOW (ref 39.0–52.0)
HCT: 22.8 % — ABNORMAL LOW (ref 39.0–52.0)
HCT: 23.2 % — ABNORMAL LOW (ref 39.0–52.0)
HCT: 21.8 % — ABNORMAL LOW (ref 39.0–52.0)
HCT: 20.7 % — ABNORMAL LOW (ref 39.0–52.0)
HEMATOCRIT: 23.7 % — AB (ref 39.0–52.0)
HEMOGLOBIN: 7.3 g/dL — AB (ref 13.0–17.0)
HEMOGLOBIN: 7 g/dL — AB (ref 13.0–17.0)
HEMOGLOBIN: 6.7 g/dL — AB (ref 13.0–17.0)
Hemoglobin: 4.3 g/dL — CL (ref 13.0–17.0)
Hemoglobin: 6.8 g/dL — CL (ref 13.0–17.0)
Hemoglobin: 7 g/dL — ABNORMAL LOW (ref 13.0–17.0)
MCH: 18.3 pg — AB (ref 26.0–34.0)
MCH: 22.5 pg — ABNORMAL LOW (ref 26.0–34.0)
MCH: 22.2 pg — ABNORMAL LOW (ref 26.0–34.0)
MCH: 22.3 pg — ABNORMAL LOW (ref 26.0–34.0)
MCH: 22.7 pg — ABNORMAL LOW (ref 26.0–34.0)
MCH: 22.9 pg — ABNORMAL LOW (ref 26.0–34.0)
MCHC: 25.7 g/dL — AB (ref 30.0–36.0)
MCHC: 29.8 g/dL — ABNORMAL LOW (ref 30.0–36.0)
MCHC: 30.2 g/dL (ref 30.0–36.0)
MCHC: 30.8 g/dL (ref 30.0–36.0)
MCHC: 32.1 g/dL (ref 30.0–36.0)
MCHC: 32.4 g/dL (ref 30.0–36.0)
MCV: 71.1 fL — ABNORMAL LOW (ref 78.0–100.0)
MCV: 75.5 fL — ABNORMAL LOW (ref 78.0–100.0)
MCV: 73.7 fL — AB (ref 78.0–100.0)
MCV: 72.3 fL — ABNORMAL LOW (ref 78.0–100.0)
MCV: 70.8 fL — AB (ref 78.0–100.0)
MCV: 70.9 fL — AB (ref 78.0–100.0)
PLATELETS: ADEQUATE 10*3/uL (ref 150–400)
PLATELETS: 123 10*3/uL — AB (ref 150–400)
PLATELETS: 137 10*3/uL — AB (ref 150–400)
Platelets: 207 10*3/uL (ref 150–400)
Platelets: 147 10*3/uL — ABNORMAL LOW (ref 150–400)
Platelets: 123 10*3/uL — ABNORMAL LOW (ref 150–400)
RBC: 2.35 MIL/uL — ABNORMAL LOW (ref 4.22–5.81)
RBC: 3.02 MIL/uL — AB (ref 4.22–5.81)
RBC: 3.15 MIL/uL — AB (ref 4.22–5.81)
RBC: 3.28 MIL/uL — ABNORMAL LOW (ref 4.22–5.81)
RBC: 3.08 MIL/uL — AB (ref 4.22–5.81)
RBC: 2.92 MIL/uL — ABNORMAL LOW (ref 4.22–5.81)
RDW: 21.2 % — ABNORMAL HIGH (ref 11.5–15.5)
RDW: 22.1 % — ABNORMAL HIGH (ref 11.5–15.5)
RDW: 21.8 % — ABNORMAL HIGH (ref 11.5–15.5)
RDW: 21.7 % — ABNORMAL HIGH (ref 11.5–15.5)
RDW: 21.6 % — ABNORMAL HIGH (ref 11.5–15.5)
RDW: 21.4 % — AB (ref 11.5–15.5)
WBC: 30.1 10*3/uL — ABNORMAL HIGH (ref 4.0–10.5)
WBC: 26.1 10*3/uL — AB (ref 4.0–10.5)
WBC: 30.5 10*3/uL — AB (ref 4.0–10.5)
WBC: 37.8 10*3/uL — AB (ref 4.0–10.5)
WBC: 22.6 10*3/uL — ABNORMAL HIGH (ref 4.0–10.5)
WBC: 19.6 10*3/uL — AB (ref 4.0–10.5)

## 2013-08-19 LAB — URINALYSIS, ROUTINE W REFLEX MICROSCOPIC
Glucose, UA: NEGATIVE mg/dL
Hgb urine dipstick: NEGATIVE
KETONES UR: 15 mg/dL — AB
NITRITE: NEGATIVE
PH: 5.5 (ref 5.0–8.0)
PROTEIN: NEGATIVE mg/dL
Specific Gravity, Urine: 1.025 (ref 1.005–1.030)
UROBILINOGEN UA: 1 mg/dL (ref 0.0–1.0)

## 2013-08-19 LAB — BASIC METABOLIC PANEL
BUN: 64 mg/dL — ABNORMAL HIGH (ref 6–23)
CO2: 11 meq/L — AB (ref 19–32)
CREATININE: 1.48 mg/dL — AB (ref 0.50–1.35)
Calcium: 8 mg/dL — ABNORMAL LOW (ref 8.4–10.5)
Chloride: 109 mEq/L (ref 96–112)
GFR calc Af Amer: 62 mL/min — ABNORMAL LOW (ref >90)
GFR calc non Af Amer: 53 mL/min — ABNORMAL LOW (ref >90)
Glucose, Bld: 166 mg/dL — ABNORMAL HIGH (ref 70–99)
Potassium: 5.2 mEq/L (ref 3.7–5.3)
Sodium: 145 mEq/L (ref 137–147)

## 2013-08-19 LAB — GLUCOSE, CAPILLARY
GLUCOSE-CAPILLARY: 160 mg/dL — AB (ref 70–99)
Glucose-Capillary: 133 mg/dL — ABNORMAL HIGH (ref 70–99)
Glucose-Capillary: 139 mg/dL — ABNORMAL HIGH (ref 70–99)
Glucose-Capillary: 146 mg/dL — ABNORMAL HIGH (ref 70–99)
Glucose-Capillary: 164 mg/dL — ABNORMAL HIGH (ref 70–99)
Glucose-Capillary: 164 mg/dL — ABNORMAL HIGH (ref 70–99)

## 2013-08-19 LAB — BLOOD GAS, ARTERIAL
Acid-base deficit: 6.1 mmol/L — ABNORMAL HIGH (ref 0.0–2.0)
BICARBONATE: 17.1 meq/L — AB (ref 20.0–24.0)
Drawn by: 39866
FIO2: 0.4 %
MECHVT: 640 mL
O2 Saturation: 99.5 %
PEEP/CPAP: 5 cmH2O
PO2 ART: 155 mmHg — AB (ref 80.0–100.0)
Patient temperature: 99.9
RATE: 15 resp/min
TCO2: 17.8 mmol/L (ref 0–100)
pCO2 arterial: 25.5 mmHg — ABNORMAL LOW (ref 35.0–45.0)
pH, Arterial: 7.445 (ref 7.350–7.450)

## 2013-08-19 LAB — TROPONIN I: Troponin I: <0.3 ng/mL (ref <0.30)

## 2013-08-19 LAB — URINE CULTURE
Colony Count: NO GROWTH
Culture: NO GROWTH

## 2013-08-19 LAB — URINE MICROSCOPIC-ADD ON

## 2013-08-19 LAB — PREPARE RBC (CROSSMATCH)

## 2013-08-19 LAB — STREP PNEUMONIAE URINARY ANTIGEN: Strep Pneumo Urinary Antigen: NEGATIVE

## 2013-08-19 LAB — PHOSPHORUS: PHOSPHORUS: 3.4 mg/dL (ref 2.3–4.6)

## 2013-08-19 LAB — LACTIC ACID, PLASMA: LACTIC ACID, VENOUS: 12.7 mmol/L — AB (ref 0.5–2.2)

## 2013-08-19 LAB — MRSA PCR SCREENING: MRSA by PCR: NEGATIVE

## 2013-08-19 MED ORDER — VITAMIN K1 10 MG/ML IJ SOLN
1.0000 mg | Freq: Once | INTRAVENOUS | Status: AC
  Administered 2013-08-19: 1 mg via INTRAVENOUS
  Filled 2013-08-19: qty 0.1

## 2013-08-19 MED ORDER — PHENYLEPHRINE HCL 10 MG/ML IJ SOLN
30.0000 ug/min | INTRAVENOUS | Status: DC
  Administered 2013-08-19: 30 ug/min via INTRAVENOUS
  Administered 2013-08-19: 200 ug/min via INTRAVENOUS
  Filled 2013-08-19 (×5): qty 4

## 2013-08-19 MED ORDER — NOREPINEPHRINE BITARTRATE 1 MG/ML IJ SOLN
2.0000 ug/min | INTRAVENOUS | Status: DC
  Administered 2013-08-19: 15 ug/min via INTRAVENOUS
  Administered 2013-08-19: 20 ug/min via INTRAVENOUS
  Filled 2013-08-19 (×3): qty 16

## 2013-08-19 MED ORDER — LACTULOSE ENEMA
300.0000 mL | Freq: Two times a day (BID) | ORAL | Status: DC
  Administered 2013-08-19 – 2013-08-22 (×7): 300 mL via RECTAL
  Filled 2013-08-19 (×9): qty 300

## 2013-08-19 MED ORDER — PROPOFOL 10 MG/ML IV EMUL
5.0000 ug/kg/min | INTRAVENOUS | Status: DC
  Administered 2013-08-19: 20 ug/kg/min via INTRAVENOUS
  Administered 2013-08-20: 15 ug/kg/min via INTRAVENOUS
  Administered 2013-08-20: 20 ug/kg/min via INTRAVENOUS
  Administered 2013-08-20: 5 ug/kg/min via INTRAVENOUS
  Administered 2013-08-21: 25 ug/kg/min via INTRAVENOUS
  Administered 2013-08-21: 19.937 ug/kg/min via INTRAVENOUS
  Administered 2013-08-21 – 2013-08-22 (×4): 20 ug/kg/min via INTRAVENOUS
  Filled 2013-08-19 (×10): qty 100

## 2013-08-19 NOTE — Progress Notes (Signed)
eLink Physician-Brief Progress Note Patient Name: [name] PandaDavid [name] DOB: 01-Apr-1961 MRN: [id number]  Date of Service  08/19/2013   HPI/Events of Note   Acidosis - resolving Lactate clearing  eICU Interventions  Dc bicarb gtt   Intervention Category Major Interventions: Acid-Base disturbance - evaluation and management  ALVA,RAKESH V. 08/19/2013, 4:31 AM

## 2013-08-19 NOTE — H&P (Signed)
PULMONARY  / CRITICAL CARE MEDICINE  Name: [name] [name] MRN: [id number] DOB: 02/11/1961 PCP Georgann Housekeeper, MD    ADMISSION DATE:  08/18/2013  LOS 1 days   CONSULTATION DATE:  08/18/2013    REFERRING MD :  Dr Radford Pax of ER  PRIMARY SERVICE: PCCM  CHIEF COMPLAINT:  GIB chronic with sever anemia, metabolic acidosis, lactic acidosis, hepatic encephalopathy  BRIEF PATIENT DESCRIPTION:  53 year old obese male with [place] documented Cirrhosis NOS (no ascites feb 2015), Hep CP, small esophageal varices and diverticulsos (per chart bx). Lives in halfway house apparently and alone. Called EMS because of feeling unwell. In ER noted to be confused (high ammonia) though calm, severe lactic acidosis of 16, sever anemia of4gm% but without visible blood loss and maintaining hemodynamic and renal function. GI apparently wants patient intubated prior to endoscopy. PCCM admitting 08/18/2013 to ICU  Of note, admitted 07/06/13 to 07/09/13: with dark maroon stool. UGI scope by by drGAnem did not show source of bleed on 07/07/13 and there was no other evidence of portal htn other than small varices. Due to 2011 - colonoscopy diverticulosis it was felt he migh have had colonic diverticualr bleed     SIGNIFICANT EVENTS / STUDIES:  08/18/2013 - admit, intubated, central line, aline and EGD: EGD w/ band ligation of varices    LINES / TUBES: None  CULTURES: Results for orders placed during the hospital encounter of 08/18/13  CULTURE, BLOOD (ROUTINE X 2)     Status: None   Collection Time    08/18/13  3:20 PM      Result Value Ref Range Status   Specimen Description BLOOD LEFT HAND   Final   Special Requests BOTTLES DRAWN AEROBIC AND ANAEROBIC 5CC   Final   Culture  Setup Time     Final   Value: 08/18/2013 19:25     Performed at [facility]   Culture     Final   Value:        BLOOD CULTURE RECEIVED NO GROWTH TO DATE CULTURE WILL BE HELD FOR 5 DAYS BEFORE ISSUING A FINAL NEGATIVE REPORT      Performed at [facility]   Report Status PENDING   Incomplete  CULTURE, BLOOD (ROUTINE X 2)     Status: None   Collection Time    08/18/13  3:31 PM      Result Value Ref Range Status   Specimen Description BLOOD RIGHT ARM   Final   Special Requests BOTTLES DRAWN AEROBIC ONLY 10CC   Final   Culture  Setup Time     Final   Value: 08/18/2013 19:25     Performed at [facility]   Culture     Final   Value:        BLOOD CULTURE RECEIVED NO GROWTH TO DATE CULTURE WILL BE HELD FOR 5 DAYS BEFORE ISSUING A FINAL NEGATIVE REPORT     Performed at [facility]   Report Status PENDING   Incomplete  MRSA PCR SCREENING     Status: None   Collection Time    08/19/13  1:30 AM      Result Value Ref Range Status   MRSA by PCR NEGATIVE  NEGATIVE Final   Comment:            The GeneXpert MRSA Assay (FDA     approved for NASAL specimens     only), is one component of a     comprehensive MRSA colonization  surveillance program. It is not     intended to diagnose MRSA     infection nor to guide or     monitor treatment for     MRSA infections.     ANTIBIOTICS: Anti-infectives   Start     Dose/Rate Route Frequency Ordered Stop   08/18/13 2215  ciprofloxacin (CIPRO) IVPB 400 mg     400 mg 200 mL/hr over 60 Minutes Intravenous Every 12 hours 08/18/13 2211     08/18/13 1630  cefTRIAXone (ROCEPHIN) 1 g in dextrose 5 % 50 mL IVPB     1 g 100 mL/hr over 30 Minutes Intravenous  Once 08/18/13 1624 08/18/13 1733   08/18/13 1615  vancomycin (VANCOCIN) 2,000 mg in sodium chloride 0.9 % 500 mL IVPB     2,000 mg 250 mL/hr over 120 Minutes Intravenous  Once 08/18/13 1611 08/18/13 2200   08/18/13 1615  piperacillin-tazobactam (ZOSYN) IVPB 3.375 g  Status:  Discontinued     3.375 g 100 mL/hr over 30 Minutes Intravenous  Once 08/18/13 1611 08/18/13 1625        SUBJECTIVE:  Events of yesterday noted. Currently on pressors. Off bicarb gtt. No acitve bleeding. But comatose off  diprivan GCS 3  VITAL SIGNS: Filed Vitals:   08/19/13 0900 08/19/13 0930 08/19/13 0935 08/19/13 1046  BP:      Pulse: 124 130 128 136  Temp: 101.3 F (38.5 C) 101.4 F (38.6 C)    TempSrc:      Resp: 27 26 28  36  Height:      Weight:      SpO2: 99% 98% 100% 100%         VENTILATOR SETTINGS: Vent Mode:  [-] PRVC FiO2 (%):  [30 %] 30 % Set Rate:  [28 bmp] 28 bmp Vt Set:  [420 mL] 420 mL PEEP:  [5 cmH20] 5 cmH20 Plateau Pressure:  [4 cmH20-21 cmH20] 20 cmH20 INTAKE / OUTPUT: I/O last 3 completed shifts: In: 5069.5 [I.V.:4064.5; Blood:1005] Out: 1330 [Urine:1330]     PHYSICAL EXAMINATION: General: Looks critically ill, intubated Neuro:  comatose HEENT:  NEck supple, PEERL + Cardiovascular:  Tachycardic Lungs:  hyperventilation on vent + Abdomen:  Obese, soft, non tender Musculoskeletal:  No cyanosis, no clubbing, no edema Skin:  intact  LABS: PULMONARY  Recent Labs Lab 08/18/13 2104 08/18/13 2340 08/19/13 0105 08/19/13 0411 08/19/13 1057  PHART 7.000* 7.108* 7.204* 7.372 7.444  PCO2ART 37.0 28.6* 22.8* 21.7* 22.4*  PO2ART 122.0* 148.0* 185.0* 166.0* 184.0*  HCO3 9.1* 9.1* 9.0* 12.6* 15.2*  TCO2 10 10 10 13 16   O2SAT 96.0 98.0 99.0 100.0 100.0    CBC  Recent Labs Lab 08/19/13 0240 08/19/13 0500 08/19/13 0900  HGB 6.8* 7.0* 7.3*  HCT 22.8* 23.2* 23.7*  WBC 26.1* 30.5* 37.8*  PLT 123* 137* 207    COAGULATION  Recent Labs Lab 08/18/13 1815 08/18/13 1847  INR 1.90* 1.71*    CARDIAC    Recent Labs Lab 08/18/13 1847 08/19/13 0240 08/19/13 0500  TROPONINI <0.30 <0.30 <0.30    Recent Labs Lab 08/18/13 1847  PROBNP 51.7     CHEMISTRY  Recent Labs Lab 08/18/13 1540 08/18/13 1847 08/19/13 0500  NA 147 149* 145  K 4.9 5.3 5.2  CL 107 111 109  CO2 <7* 8* 11*  GLUCOSE 159* 133* 166*  BUN 51* 49* 64*  CREATININE 1.09 1.09 1.48*  CALCIUM 8.7 8.6 8.0*  MG  --  2.1 2.0  PHOS  --  3.7 3.4   Estimated Creatinine  Clearance: 90.6 ml/min (by C-G formula based on Cr of 1.48).   LIVER  Recent Labs Lab 08/18/13 1540 08/18/13 1815 08/18/13 1847  AST 102*  --  116*  ALT 48  --  53  ALKPHOS 51  --  51  BILITOT 1.1  --  1.3*  PROT 5.8*  --  6.2  ALBUMIN 2.8*  --  3.0*  INR  --  1.90* 1.71*     INFECTIOUS  Recent Labs Lab 08/18/13 1534 08/18/13 1847 08/19/13 0240  LATICACIDVEN 16.89* 18.2* 12.7*  PROCALCITON  --  0.23  --      ENDOCRINE CBG (last 3)   Recent Labs  08/19/13 0045 08/19/13 0338 08/19/13 0735  GLUCAP 133* 164* 146*         IMAGING x48h  Dg Chest Port 1 View  08/18/2013   CLINICAL DATA:  Hypoxia ; endotracheal tube adjustment  EXAM: PORTABLE CHEST - 1 VIEW  COMPARISON:  Study obtained earlier in the day  FINDINGS: Endotracheal tube tip is 3.1 cm above the carina. Right jugular catheter tip is in the superior vena cava near the cavoatrial junction. No pneumothorax. Lungs are clear. Heart is upper normal in size with normal pulmonary vascularity. No adenopathy.  IMPRESSION: Tube and catheter positions as described without pneumothorax. Lungs clear.   Electronically Signed   By: Bretta BangWilliam  Woodruff M.D.   On: 08/18/2013 20:49   Dg Chest Port 1 View  08/18/2013   CLINICAL DATA:  Endotracheal tube placement  EXAM: PORTABLE CHEST - 1 VIEW  COMPARISON:  08/18/2013  FINDINGS: Borderline cardiomegaly. Right IJ catheter is unchanged in position. Endotracheal tube in place with tip 3.1 cm above the carina. No pneumothorax.  IMPRESSION: No active disease.  Endotracheal tube in place.  No pneumothorax.   Electronically Signed   By: Natasha MeadLiviu  Pop M.D.   On: 08/18/2013 20:47   Dg Chest Port 1 View  08/18/2013   CLINICAL DATA:  Central catheter placement  EXAM: PORTABLE CHEST - 1 VIEW  COMPARISON:  Study obtained earlier in the day  FINDINGS: Central catheter tip is in the superior vena cava. No pneumothorax. Lungs are clear. Heart size and pulmonary vascularity are normal. No adenopathy.  No bone lesions.  IMPRESSION: Central catheter tip in superior vena cava. No pneumothorax. No edema or consolidation.   Electronically Signed   By: Bretta BangWilliam  Woodruff M.D.   On: 08/18/2013 20:26   Dg Chest Port 1 View  08/18/2013   CLINICAL DATA:  Sepsis, confusion, hypotension  EXAM: PORTABLE CHEST - 1 VIEW  COMPARISON:  09/27/2012  FINDINGS: Cardiomediastinal silhouette is stable. No acute infiltrate or pleural effusion. No pulmonary edema. Bony thorax is unremarkable.  IMPRESSION: No active disease.   Electronically Signed   By: Natasha MeadLiviu  Pop M.D.   On: 08/18/2013 16:43   Dg Abd Portable 2v  08/19/2013   CLINICAL DATA:  abdominal distention, variceal bleed, leucocytosis  EXAM: PORTABLE ABDOMEN - 2 VIEW  COMPARISON:  None.  FINDINGS: The bowel gas pattern is normal. There is no evidence of free air. No radio-opaque calculi or other significant radiographic abnormality is seen.  IMPRESSION: Negative.   Electronically Signed   By: Salome HolmesHector  Cooper M.D.   On: 08/19/2013 10:56       ASSESSMENT / PLAN:  PULMONARY A:  Intubated for UGI scpe. Acute resp failure. Now comatose  P:   Full vent support  CARDIOVASCULAR A: Now circulatory shock post itnubation though  active bleeding resolved  P:  Levophed for MAP > 65   RENAL A:  Severe lactic acidosis - ? Due to blood loss. Normal amylase   - improving. Off bicarb gtt   P:   Hydrate Monitor   GASTROINTESTINAL A:  Cirrhosis NOS Child Class   Admit with Acute variceal bleed and sp banding 08/18/13. No NG/OG per GI  P:   Lactulose enema Start xifaxan for hepatic enceph when gi allows ng tube placemtn octreotide and protonix gtt  HEMATOLOGIC A:  seveer blood loss anemia   - holding own currently  P:  prbc for hgb > 7gm%  INFECTIOUS A:  No evidence of sepsis P:   Culture Sepsis biomarkers Monitor  ENDOCRINE A: nil acute P:   monitor  NEUROLOGIC A:  Acute enceph likely liver source. NH3 130 P:   Start lactulose  enema Will have to wait altest 72h per Dr Bosie Clos before panda placement and start xifaxan  GLOBAL 4./4/15:No family at bedside           The patient is critically ill with multiple organ systems failure and requires high complexity decision making for assessment and support, frequent evaluation and titration of therapies, application of advanced monitoring technologies and extensive interpretation of multiple databases.   Critical Care Time devoted to patient care services described in this note is  35  Minutes.  Dr. Kalman Shan, M.D., [facility].C.P Pulmonary and Critical Care Medicine Staff Physician [facility] Pager: [phone], If no answer or between  15:00h - 7:00h: call [phone]  08/19/2013 11:29 AM

## 2013-08-19 NOTE — Progress Notes (Signed)
Patient ID: [name] [name], male   DOB: 1961-04-18, 53 y.o.   MRN: [id number] [facility]  [name] [name] 53 y.o. 1961-04-18   Subjective: Sedated. Intubated. On pressors. No BMs overnight. No vomiting. S/P 5 U PRBCs.  Objective: Vital signs in last 24 hours: Filed Vitals:   08/19/13 0900  BP:   Pulse: 124  Temp: 101.3 F (38.5 C)  Resp: 27    Physical Exam: Gen: sedated, intubated CV: Tachycardic with regular rhythm Chest: Coarse breath sounds Abd: distended, decreased BS, obese  Lab Results:  Recent Labs  08/18/13 1847 08/19/13 0500  NA 149* 145  K 5.3 5.2  CL 111 109  CO2 8* 11*  GLUCOSE 133* 166*  BUN 49* 64*  CREATININE 1.09 1.48*  CALCIUM 8.6 8.0*  MG 2.1 2.0  PHOS 3.7 3.4    Recent Labs  08/18/13 1540 08/18/13 1847  AST 102* 116*  ALT 48 53  ALKPHOS 51 51  BILITOT 1.1 1.3*  PROT 5.8* 6.2  ALBUMIN 2.8* 3.0*    Recent Labs  08/18/13 1847  08/19/13 0240 08/19/13 0500  WBC 22.3*  < > 26.1* 30.5*  NEUTROABS 19.6*  --   --   --   HGB 4.9*  < > 6.8* 7.0*  HCT 18.3*  < > 22.8* 23.2*  MCV 68.5*  < > 75.5* 73.7*  PLT 230  < > 123* 137*  < > = values in this interval not displayed.  Recent Labs  08/18/13 1815 08/18/13 1847  LABPROT 21.2* 19.6*  INR 1.90* 1.71*      Assessment/Plan: 52yo with decompensated cirrhosis and variceal bleed. Severe metabolic acidosis on presentation treated by CCM. Intubated on pressors. Serum Cr rising. No further bleeding and Hgb 7.0 (4.8) s/p 5 U PRBCs per nursing staff. Will do Abd Xray due to distention and worsening leucocytosis and may need an abd CT noncontrast if Xray concerning. Continue Octreotide drip. Protonix drip. Goal HCT no higher than 23-24 due to recent variceal bleed. Will follow.   [name] [name] C. 08/19/2013, 9:15 AM

## 2013-08-19 NOTE — Progress Notes (Addendum)
INITIAL NUTRITION ASSESSMENT  DOCUMENTATION CODES Per approved criteria  -Obesity Unspecified   INTERVENTION:  If prolonged intubation expected, recommend nutrition support (TPN vs EN) initiation within next 24-48 hours RD to follow for nutrition care plan  NUTRITION DIAGNOSIS: Inadequate oral intake related to inability to eat as evidenced by NPO status  Goal: Initiation nutrition support 24-48 hours of ICU admission if prolonged intubation expected  Monitor:  Nutrition support initiation, respiratory status, weight, labs, I/O's  Reason for Assessment: VDRF  53 y.o. male  Admitting Dx: melena  ASSESSMENT: 53 y.o. male with history of Hep C and alcohol cirrhosis who presened with a history of GI bleed with severe anemia and report of melena. Patient called EMS because he felt sick.   Patient s/p procedure 4/3: EGD with BAND LIGATIONS OF VARICES  Patient is currently intubated on ventilator support MV: 19.3 L/min Temp (24hrs), Avg:98.5 F (36.9 C), Min:97 F (36.1 C), Max:101 F (38.3 C)  Propofol: 12.1 ml/hr ----> 319 fat kcals   GI Brief Op Note reviewed.  Dr. Bosie ClosSchooler recommending no OG or NG tube insertion.  Height: Ht Readings from Last 1 Encounters:  08/18/13 6' 5.17" (1.96 m)    Weight: Wt Readings from Last 1 Encounters:  08/19/13 302 lb 4 oz (137.1 kg)    Ideal Body Weight: 208 lb  % Ideal Body Weight: 145%  Wt Readings from Last 10 Encounters:  08/19/13 302 lb 4 oz (137.1 kg)  08/19/13 302 lb 4 oz (137.1 kg)  07/08/13 295 lb 3.1 oz (133.9 kg)  07/08/13 295 lb 3.1 oz (133.9 kg)  01/04/13 315 lb 14.7 oz (143.3 kg)  01/04/13 315 lb 14.7 oz (143.3 kg)  09/28/12 299 lb 6.2 oz (135.8 kg)  09/11/12 288 lb (130.636 kg)  04/01/12 302 lb 0.5 oz (137 kg)  10/30/11 272 lb (123.378 kg)    Usual Body Weight: 295 lb  % Usual Body Weight: 102%  BMI:  Body mass index is 35.69 kg/(m^2).  Estimated Nutritional Needs: Kcal: 3036 Protein: 190-200  gm Fluid: per MD  Skin: Intact  Diet Order: NPO  EDUCATION NEEDS: -No education needs identified at this time   Intake/Output Summary (Last 24 hours) at 08/19/13 0831 Last data filed at 08/19/13 0400  Gross per 24 hour  Intake 4794.36 ml  Output   1330 ml  Net 3464.36 ml   Labs:   Recent Labs Lab 08/18/13 1540 08/18/13 1847 08/19/13 0500  NA 147 149* 145  K 4.9 5.3 5.2  CL 107 111 109  CO2 <7* 8* 11*  BUN 51* 49* 64*  CREATININE 1.09 1.09 1.48*  CALCIUM 8.7 8.6 8.0*  MG  --  2.1 2.0  PHOS  --  3.7 3.4  GLUCOSE 159* 133* 166*    CBG (last 3)   Recent Labs  08/19/13 0045 08/19/13 0338 08/19/13 0735  GLUCAP 133* 164* 146*    Scheduled Meds: . ciprofloxacin  400 mg Intravenous Q12H    Continuous Infusions: . sodium chloride    . norepinephrine (LEVOPHED) Adult infusion 20 mcg/min (08/19/13 0123)  . octreotide (SANDOSTATIN) infusion 25 mcg/hr (08/18/13 1801)  . pantoprozole (PROTONIX) infusion 8 mg/hr (08/19/13 0821)  . propofol 15 mcg/kg/min (08/19/13 [id number])    Past Medical History  Diagnosis Date  . Thrombocytopenia   . Cirrhosis   . Hepatitis C   . Hypertension   . Anxiety   . Anemia   . Blood transfusion   . Obesity   . Pancreatitis   .  Bipolar disorder, unspecified 09/27/2012  . Depression 09/27/2012    Past Surgical History  Procedure Laterality Date  . Tonsillectomy    . Esophagogastroduodenoscopy N/A 01/05/2013    Procedure: ESOPHAGOGASTRODUODENOSCOPY (EGD);  Surgeon: Vertell Novak., MD;  Location: [facility] ENDOSCOPY;  Service: Endoscopy;  Laterality: N/A;  . Esophagogastroduodenoscopy N/A 07/07/2013    Procedure: ESOPHAGOGASTRODUODENOSCOPY (EGD);  Surgeon: Graylin Shiver, MD;  Location[facility] ENDOSCOPY;  Service: Endoscopy;  Laterality: N/A;    Maureen Chatters, RD, LDN Pager #: [phone] After-Hours Pager #: [phone]

## 2013-08-19 NOTE — Procedures (Signed)
Arterial Catheter Insertion Procedure Note [name] [name] [id number] 07/21/1960  Procedure: Insertion of Arterial Catheter  Indications: Blood pressure monitoring  Procedure Details Consent: Unable to obtain consent because of altered level of consciousness. Time Out: Verified patient identification, verified procedure, site/side was marked, verified correct patient position, special equipment/implants available, medications/allergies/relevent history reviewed, required imaging and test results available.  Performed  Maximum sterile technique was used including antiseptics, cap, gloves, gown, hand hygiene and mask. Skin prep: Chlorhexidine; local anesthetic administered 20 gauge catheter was inserted into right radial artery using the Seldinger technique.  Evaluation Blood flow good; BP tracing good. Complications: No apparent complications.   [name] [name], [name] [name] 08/19/2013

## 2013-08-19 NOTE — Progress Notes (Signed)
eLink Physician-Brief Progress Note Patient Name: [name] PandaDavid Haithcock DOB: 1960/12/03 MRN: [id number]  Date of Service  08/19/2013   HPI/Events of Note  hgb 6.7   eICU Interventions  1 unit prbc's transfused      [name], [name], P 08/19/2013, 9:11 PM

## 2013-08-19 NOTE — Progress Notes (Signed)
eLink Physician-Brief Progress Note Patient Name: [name] [name] DOB: Apr 20, 1961 MRN: [id number]  Date of Service  08/19/2013   HPI/Events of Note   Patient with cirrhosis and UGI bleed from varices.  S/p banding.  Call for hypotension.  Hgb 7.0 and stable no signs of active bleeding.  CVP reported as 2.  Patient on propofol drip.  eICU Interventions  Bolus 1 liter NS Decrease propofol gtt abg   Intervention Category Intermediate Interventions: Hypotension - evaluation and management  [name], [name], P 08/19/2013, 5:46 PM

## 2013-08-20 ENCOUNTER — Inpatient Hospital Stay (HOSPITAL_COMMUNITY): Payer: Medicare Other

## 2013-08-20 DIAGNOSIS — J9600 Acute respiratory failure, unspecified whether with hypoxia or hypercapnia: Secondary | ICD-10-CM

## 2013-08-20 LAB — CBC
HCT: 22.4 % — ABNORMAL LOW (ref 39.0–52.0)
HCT: 20.9 % — ABNORMAL LOW (ref 39.0–52.0)
HEMATOCRIT: 23.6 % — AB (ref 39.0–52.0)
HEMOGLOBIN: 7.6 g/dL — AB (ref 13.0–17.0)
Hemoglobin: 7.3 g/dL — ABNORMAL LOW (ref 13.0–17.0)
Hemoglobin: 6.8 g/dL — CL (ref 13.0–17.0)
MCH: 23.2 pg — ABNORMAL LOW (ref 26.0–34.0)
MCH: 23.2 pg — ABNORMAL LOW (ref 26.0–34.0)
MCH: 23.5 pg — ABNORMAL LOW (ref 26.0–34.0)
MCHC: 32.6 g/dL (ref 30.0–36.0)
MCHC: 32.5 g/dL (ref 30.0–36.0)
MCHC: 32.2 g/dL (ref 30.0–36.0)
MCV: 71.3 fL — ABNORMAL LOW (ref 78.0–100.0)
MCV: 71.3 fL — ABNORMAL LOW (ref 78.0–100.0)
MCV: 73.1 fL — ABNORMAL LOW (ref 78.0–100.0)
PLATELETS: 65 10*3/uL — AB (ref 150–400)
Platelets: 73 10*3/uL — ABNORMAL LOW (ref 150–400)
Platelets: 58 10*3/uL — ABNORMAL LOW (ref 150–400)
RBC: 3.14 MIL/uL — AB (ref 4.22–5.81)
RBC: 2.93 MIL/uL — ABNORMAL LOW (ref 4.22–5.81)
RBC: 3.23 MIL/uL — ABNORMAL LOW (ref 4.22–5.81)
RDW: 21.3 % — ABNORMAL HIGH (ref 11.5–15.5)
RDW: 21.4 % — ABNORMAL HIGH (ref 11.5–15.5)
RDW: 21.4 % — ABNORMAL HIGH (ref 11.5–15.5)
WBC: 10.9 10*3/uL — ABNORMAL HIGH (ref 4.0–10.5)
WBC: 7.5 10*3/uL (ref 4.0–10.5)
WBC: 7.9 10*3/uL (ref 4.0–10.5)

## 2013-08-20 LAB — PROTIME-INR
INR: 1.94 — AB (ref 0.00–1.49)
PROTHROMBIN TIME: 21.6 s — AB (ref 11.6–15.2)

## 2013-08-20 LAB — CBC WITH DIFFERENTIAL/PLATELET
Basophils Absolute: 0 10*3/uL (ref 0.0–0.1)
Basophils Relative: 0 % (ref 0–1)
EOS PCT: 0 % (ref 0–5)
Eosinophils Absolute: 0 10*3/uL (ref 0.0–0.7)
HCT: 22.4 % — ABNORMAL LOW (ref 39.0–52.0)
Hemoglobin: 7.3 g/dL — ABNORMAL LOW (ref 13.0–17.0)
LYMPHS PCT: 7 % — AB (ref 12–46)
Lymphs Abs: 1.2 10*3/uL (ref 0.7–4.0)
MCH: 23.2 pg — ABNORMAL LOW (ref 26.0–34.0)
MCHC: 32.6 g/dL (ref 30.0–36.0)
MCV: 71.3 fL — ABNORMAL LOW (ref 78.0–100.0)
MONO ABS: 2.7 10*3/uL — AB (ref 0.1–1.0)
Monocytes Relative: 15 % — ABNORMAL HIGH (ref 3–12)
NEUTROS PCT: 78 % — AB (ref 43–77)
Neutro Abs: 13.9 10*3/uL — ABNORMAL HIGH (ref 1.7–7.7)
PLATELETS: 98 10*3/uL — AB (ref 150–400)
RBC: 3.14 MIL/uL — AB (ref 4.22–5.81)
RDW: 21.7 % — ABNORMAL HIGH (ref 11.5–15.5)
WBC: 17.8 10*3/uL — AB (ref 4.0–10.5)

## 2013-08-20 LAB — BASIC METABOLIC PANEL
BUN: 89 mg/dL — ABNORMAL HIGH (ref 6–23)
BUN: 91 mg/dL — AB (ref 6–23)
CALCIUM: 7.5 mg/dL — AB (ref 8.4–10.5)
CHLORIDE: 114 meq/L — AB (ref 96–112)
CO2: 17 mEq/L — ABNORMAL LOW (ref 19–32)
CO2: 17 mEq/L — ABNORMAL LOW (ref 19–32)
Calcium: 7.6 mg/dL — ABNORMAL LOW (ref 8.4–10.5)
Chloride: 116 mEq/L — ABNORMAL HIGH (ref 96–112)
Creatinine, Ser: 1.92 mg/dL — ABNORMAL HIGH (ref 0.50–1.35)
Creatinine, Ser: 1.74 mg/dL — ABNORMAL HIGH (ref 0.50–1.35)
GFR calc Af Amer: 51 mL/min — ABNORMAL LOW (ref >90)
GFR calc non Af Amer: 39 mL/min — ABNORMAL LOW (ref >90)
GFR calc non Af Amer: 44 mL/min — ABNORMAL LOW (ref >90)
GFR, EST AFRICAN AMERICAN: 45 mL/min — AB (ref >90)
Glucose, Bld: 150 mg/dL — ABNORMAL HIGH (ref 70–99)
Glucose, Bld: 157 mg/dL — ABNORMAL HIGH (ref 70–99)
Potassium: 3.8 mEq/L (ref 3.7–5.3)
Potassium: 3.9 mEq/L (ref 3.7–5.3)
SODIUM: 146 meq/L (ref 137–147)
Sodium: 147 mEq/L (ref 137–147)

## 2013-08-20 LAB — URINE CULTURE
CULTURE: NO GROWTH
Colony Count: NO GROWTH

## 2013-08-20 LAB — PREPARE RBC (CROSSMATCH)

## 2013-08-20 LAB — LEGIONELLA ANTIGEN, URINE: Legionella Antigen, Urine: NEGATIVE

## 2013-08-20 LAB — GLUCOSE, CAPILLARY
GLUCOSE-CAPILLARY: 159 mg/dL — AB (ref 70–99)
GLUCOSE-CAPILLARY: 164 mg/dL — AB (ref 70–99)
Glucose-Capillary: 132 mg/dL — ABNORMAL HIGH (ref 70–99)
Glucose-Capillary: 157 mg/dL — ABNORMAL HIGH (ref 70–99)
Glucose-Capillary: 128 mg/dL — ABNORMAL HIGH (ref 70–99)
Glucose-Capillary: 134 mg/dL — ABNORMAL HIGH (ref 70–99)

## 2013-08-20 LAB — CULTURE, BLOOD (ROUTINE X 2)

## 2013-08-20 LAB — MAGNESIUM: MAGNESIUM: 2.3 mg/dL (ref 1.5–2.5)

## 2013-08-20 LAB — PHOSPHORUS: PHOSPHORUS: 3.2 mg/dL (ref 2.3–4.6)

## 2013-08-20 LAB — LACTIC ACID, PLASMA: Lactic Acid, Venous: 1.8 mmol/L (ref 0.5–2.2)

## 2013-08-20 MED ORDER — CIPROFLOXACIN IN D5W 400 MG/200ML IV SOLN: 400.0000 mg | Freq: Two times a day (BID) | INTRAVENOUS | Status: DC

## 2013-08-20 MED ORDER — DEXTROSE 5 % IV SOLN
1.0000 g | INTRAVENOUS | Status: DC
  Administered 2013-08-20: 1 g via INTRAVENOUS
  Filled 2013-08-20 (×2): qty 10

## 2013-08-20 MED ORDER — PNEUMOCOCCAL VAC POLYVALENT 25 MCG/0.5ML IJ INJ
0.5000 mL | INJECTION | INTRAMUSCULAR | Status: AC
  Administered 2013-08-21: 0.5 mL via INTRAMUSCULAR
  Filled 2013-08-20: qty 0.5

## 2013-08-20 MED ORDER — BIOTENE DRY MOUTH MT LIQD
15.0000 mL | Freq: Four times a day (QID) | OROMUCOSAL | Status: DC
  Administered 2013-08-20 – 2013-08-23 (×13): 15 mL via OROMUCOSAL

## 2013-08-20 MED ORDER — SODIUM CHLORIDE 0.9 % IV BOLUS (SEPSIS)
1000.0000 mL | Freq: Once | INTRAVENOUS | Status: AC
  Administered 2013-08-20: 1000 mL via INTRAVENOUS

## 2013-08-20 MED ORDER — SODIUM CHLORIDE 0.9 % IV SOLN: 8.0000 mg/h | INTRAVENOUS | Status: DC

## 2013-08-20 MED ORDER — SODIUM CHLORIDE 0.9 % IV SOLN: INTRAVENOUS | Status: DC

## 2013-08-20 MED ORDER — CHLORHEXIDINE GLUCONATE 0.12 % MT SOLN
15.0000 mL | Freq: Two times a day (BID) | OROMUCOSAL | Status: DC
  Administered 2013-08-20 – 2013-08-23 (×7): 15 mL via OROMUCOSAL
  Filled 2013-08-20 (×7): qty 15

## 2013-08-20 NOTE — Progress Notes (Signed)
eLink Physician-Brief Progress Note Patient Name: [name] PandaDavid [name] DOB: April 08, 1961 MRN: [id number]  Date of Service  08/20/2013   HPI/Events of Note  Hgb continues to slowly drift down.  6.8 now.   eICU Interventions  Transfuse 1 unit PRBC's.        [name], Trenita Hulme, P 08/20/2013, 4:55 PM

## 2013-08-20 NOTE — Progress Notes (Signed)
Patient ID: [name] [name], male   DOB: October 17, 1960, [age] y.o.   MRN: [id number] [facility]  [name] [name] 53 y.o. October 17, 1960   Subjective: Off pressors. Sedated. Intubated. S/P 1 U PRBCs yesterday for a Hgb 6.7. Several bloody stools overnight with one occurring after the Lactulose enema.  Objective: Vital signs in last 24 hours: Filed Vitals:   08/20/13 0917  BP: 155/63  Pulse: 109  Temp: 98.9 F (37.2 C)  Resp: 26    Physical Exam: Gen: sedated, intubated. Abd: soft, nondistended  Lab Results:  Recent Labs  08/19/13 0500 08/20/13 0425  NA 145 146  K 5.2 3.8  CL 109 114*  CO2 11* 17*  GLUCOSE 166* 150*  BUN 64* 89*  CREATININE 1.48* 1.92*  CALCIUM 8.0* 7.6*  MG 2.0 2.3  PHOS 3.4 3.2    Recent Labs  08/18/13 1540 08/18/13 1847  AST 102* 116*  ALT 48 53  ALKPHOS 51 51  BILITOT 1.1 1.3*  PROT 5.8* 6.2  ALBUMIN 2.8* 3.0*    Recent Labs  08/18/13 1847  08/20/13 0425 08/20/13 0846  WBC 22.3*  < > 17.8* 10.9*  NEUTROABS 19.6*  --  13.9*  --   HGB 4.9*  < > 7.3* 7.3*  HCT 18.3*  < > 22.4* 22.4*  MCV 68.5*  < > 71.3* 71.3*  PLT 230  < > 98* 73*  < > = values in this interval not displayed.  Recent Labs  08/18/13 1847 08/20/13 0425  LABPROT 19.6* 21.6*  INR 1.71* 1.94*    Abd Xray negative.  Assessment/Plan: Decompensated cirrhosis with variceal bleed and severe metabolic acidosis on admit. Intubated. Off pressors now. Encephalopathic - continue Lactulose enemas. Cr rising and at risk of developing hepatorenal syndrome. WBC trending down. Would continue to avoid any oral gastric or nasogastric tubes due to risk of dislodging variceal bands. No role for repeat endoscopy with recent variceal bands and suspect blood overnight was passage of residual blood. Continue Octreotide drip, Protonix drip. IV Abx. Aggressive supportive care. Dr. Dulce Sellarutlaw to see tomorrow.    Lelaina Oatis C. 08/20/2013, 10:14 AM

## 2013-08-20 NOTE — Progress Notes (Signed)
PULMONARY / CRITICAL CARE MEDICINE, progress note   Name: [name] [name] MRN: [id number] DOB: 04/27/1961    ADMISSION DATE:  08/18/2013 CONSULTATION DATE:  08/18/13  REFERRING MD :    CHIEF COMPLAINT:  Upper GIB  BRIEF PATIENT DESCRIPTION:   53 year old obese male with [place] documented Cirrhosis NOS (no ascites feb 2015), Hep C, small esophageal varices and diverticulsos (per chart bx). Lives in halfway house apparently and alone. Called EMS because of feeling unwell. In ER noted to be confused (high ammonia) though calm, severe lactic acidosis of 16, sever anemia of4gm% but without visible blood loss and maintaining hemodynamic and renal function. GI apparently wants patient intubated prior to endoscopy. PCCM admitting 08/18/2013 to ICU  Of note, admitted 07/06/13 to 07/09/13: with dark maroon stool. UGI scope by by drGAnem did not show source of bleed on 07/07/13 and there was no other evidence of portal htn other than small varices. Due to 2011 - colonoscopy diverticulosis it was felt he migh have had colonic diverticualr bleed  SIGNIFICANT EVENTS / STUDIES:   4/3 - admit, intubated, central line, aline  4/3- transfused 3 U of blood and 1 Unit of FFP 4/4- transfused 3 U of blood 4/3- CXR: No active disease. 4/3- EGD w/ band ligation of varices 4/4- X-ray abdo no evidence of free air. 1 more U prbc  LINES / TUBES: 08/18/13- CVL>> 08/18/13-aline>>  CULTURES: 4/3- Urine culture negative 4/3- Blood culture X 2, 1/2 positive for GRAM POSITIVE COCCI IN CLUSTERS on 4/4.    ANTIBIOTICS: 4/3- cefriaxone>>4/3, 4/5 >> (SBP prophylaxis in setting of GIB) >> 4/3- cipro>>4/5 4/4- Vancomycine>>4/3  SUBJECTIVE:   Subjective:  -sdated, intubated. Had two bloody stool per nurse (one BW is after given lactulose).   - staff MD note: Off pressors. Somewhat more responsive. Lungs coarse with increased secretions. Appears to have ongoing slow bleed but Gi thinks is slow residual bleed and  monitoring   VITAL SIGNS: Temp:  [98.6 F (37 C)-102.4 F (39.1 C)] 99 F (37.2 C) (04/05 0600) Pulse Rate:  [82-136] 87 (04/05 0600) Resp:  [20-38] 21 (04/05 0600) BP: (79-145)/(48-82) 139/77 mmHg (04/05 0600) SpO2:  [96 %-100 %] 100 % (04/05 0600) Arterial Line BP: (60-128)/(40-77) 110/60 mmHg (04/05 0600) FiO2 (%):  [40 %-45 %] 40 % (04/05 0400) Weight:  [306 lb (138.8 kg)] 306 lb (138.8 kg) (04/05 0423) HEMODYNAMICS: CVP:  [0 mmHg-9 mmHg] 5 mmHg VENTILATOR SETTINGS: Vent Mode:  [-] PRVC FiO2 (%):  [40 %-45 %] 40 % Set Rate:  [15 bmp-30 bmp] 15 bmp Vt Set:  [640 mL] 640 mL PEEP:  [5 cmH20] 5 cmH20 Pressure Support:  [10 cmH20] 10 cmH20 Plateau Pressure:  [15 cmH20-16 cmH20] 16 cmH20 INTAKE / OUTPUT: Intake/Output     04/04 0701 - 04/05 0700   I.V. (mL/kg) 3851.5 (27.7)   IV Piggyback 400   Total Intake(mL/kg) 4251.5 (30.6)   Urine (mL/kg/hr) 1687 (0.5)   Total Output 1687   Net +2564.5         PHYSICAL EXAMINATION: General: Looks critically ill, intubated and sedated Neuro: reponds to stimulii (staff MD note) HEENT: Neck supple, PEERL +  Cardiovascular: Tachycardic  Lungs: on vent + . CTAB Abdomen: Obese, soft, Bowel sound present  Musculoskeletal: No cyanosis, no clubbing, no edema  Skin: intact  LABS:   PULMONARY  Recent Labs Lab 08/18/13 2340 08/19/13 0105 08/19/13 0411 08/19/13 1057 08/19/13 2025  PHART 7.108* 7.204* 7.372 7.444 7.445  PCO2ART 28.6* 22.8* 21.7*  22.4* 25.5*  PO2ART 148.0* 185.0* 166.0* 184.0* 155.0*  HCO3 9.1* 9.0* 12.6* 15.2* 17.1*  TCO2 10 10 13 16  17.8  O2SAT 98.0 99.0 100.0 100.0 99.5    CBC  Recent Labs Lab 08/19/13 2035 08/20/13 0425 08/20/13 0846  HGB 6.7* 7.3* 7.3*  HCT 20.7* 22.4* 22.4*  WBC 19.6* 17.8* 10.9*  PLT 123* 98* 73*    COAGULATION  Recent Labs Lab 08/18/13 1815 08/18/13 1847 08/20/13 0425  INR 1.90* 1.71* 1.94*    CARDIAC   Recent Labs Lab 08/18/13 1847 08/19/13 0240  08/19/13 0500  TROPONINI <0.30 <0.30 <0.30    Recent Labs Lab 08/18/13 1847  PROBNP 51.7     CHEMISTRY  Recent Labs Lab 08/18/13 1540 08/18/13 1847 08/19/13 0500 08/20/13 0425  NA 147 149* 145 146  K 4.9 5.3 5.2 3.8  CL 107 111 109 114*  CO2 <7* 8* 11* 17*  GLUCOSE 159* 133* 166* 150*  BUN 51* 49* 64* 89*  CREATININE 1.09 1.09 1.48* 1.92*  CALCIUM 8.7 8.6 8.0* 7.6*  MG  --  2.1 2.0 2.3  PHOS  --  3.7 3.4 3.2   Estimated Creatinine Clearance: 70.3 ml/min (by C-G formula based on Cr of 1.92).   LIVER  Recent Labs Lab 08/18/13 1540 08/18/13 1815 08/18/13 1847 08/20/13 0425  AST 102*  --  116*  --   ALT 48  --  53  --   ALKPHOS 51  --  51  --   BILITOT 1.1  --  1.3*  --   PROT 5.8*  --  6.2  --   ALBUMIN 2.8*  --  3.0*  --   INR  --  1.90* 1.71* 1.94*     INFECTIOUS  Recent Labs Lab 08/18/13 1534 08/18/13 1847 08/19/13 0240 08/20/13 0425  LATICACIDVEN 16.89* 18.2* 12.7* 1.8  PROCALCITON  --  0.23  --   --      ENDOCRINE CBG (last 3)   Recent Labs  08/20/13 0026 08/20/13 0449 08/20/13 0748  GLUCAP 159* 132* 157*         IMAGING x48h  Dg Chest Port 1 View  08/20/2013   CLINICAL DATA:  Endotracheal tube  EXAM: PORTABLE CHEST - 1 VIEW  COMPARISON:  08/18/2013  FINDINGS: The cardiac shadow is stable. An endotracheal tube is again identified in satisfactory position just above the aortic knob. A right-sided central venous line is again seen and stable. The lungs are clear.  IMPRESSION: No acute abnormality noted.   Electronically Signed   By: Alcide Clever M.D.   On: 08/20/2013 09:29   Dg Chest Port 1 View  08/18/2013   CLINICAL DATA:  Hypoxia ; endotracheal tube adjustment  EXAM: PORTABLE CHEST - 1 VIEW  COMPARISON:  Study obtained earlier in the day  FINDINGS: Endotracheal tube tip is 3.1 cm above the carina. Right jugular catheter tip is in the superior vena cava near the cavoatrial junction. No pneumothorax. Lungs are clear. Heart is upper  normal in size with normal pulmonary vascularity. No adenopathy.  IMPRESSION: Tube and catheter positions as described without pneumothorax. Lungs clear.   Electronically Signed   By: Bretta Bang M.D.   On: 08/18/2013 20:49   Dg Chest Port 1 View  08/18/2013   CLINICAL DATA:  Endotracheal tube placement  EXAM: PORTABLE CHEST - 1 VIEW  COMPARISON:  08/18/2013  FINDINGS: Borderline cardiomegaly. Right IJ catheter is unchanged in position. Endotracheal tube in place with tip 3.1 cm above  the carina. No pneumothorax.  IMPRESSION: No active disease.  Endotracheal tube in place.  No pneumothorax.   Electronically Signed   By: Natasha MeadLiviu  Pop M.D.   On: 08/18/2013 20:47   Dg Chest Port 1 View  08/18/2013   CLINICAL DATA:  Central catheter placement  EXAM: PORTABLE CHEST - 1 VIEW  COMPARISON:  Study obtained earlier in the day  FINDINGS: Central catheter tip is in the superior vena cava. No pneumothorax. Lungs are clear. Heart size and pulmonary vascularity are normal. No adenopathy. No bone lesions.  IMPRESSION: Central catheter tip in superior vena cava. No pneumothorax. No edema or consolidation.   Electronically Signed   By: Bretta BangWilliam  Woodruff M.D.   On: 08/18/2013 20:26   Dg Chest Port 1 View  08/18/2013   CLINICAL DATA:  Sepsis, confusion, hypotension  EXAM: PORTABLE CHEST - 1 VIEW  COMPARISON:  09/27/2012  FINDINGS: Cardiomediastinal silhouette is stable. No acute infiltrate or pleural effusion. No pulmonary edema. Bony thorax is unremarkable.  IMPRESSION: No active disease.   Electronically Signed   By: Natasha MeadLiviu  Pop M.D.   On: 08/18/2013 16:43   Dg Abd Portable 2v  08/19/2013   CLINICAL DATA:  abdominal distention, variceal bleed, leucocytosis  EXAM: PORTABLE ABDOMEN - 2 VIEW  COMPARISON:  None.  FINDINGS: The bowel gas pattern is normal. There is no evidence of free air. No radio-opaque calculi or other significant radiographic abnormality is seen.  IMPRESSION: Negative.   Electronically Signed   By: Salome HolmesHector   Cooper M.D.   On: 08/19/2013 10:56     ASSESSMENT / PLAN:  PULMONARY A:  Intubated for UGI scpe. Acute resp failure.    - does not meet sbt criteria due to encephalopathy  P:   Full vent support  CARDIOVASCULAR A: Hemorrhagic shock @ admit 08/18/2013.      - off pressors 08/20/13   P:   Monitor off pressors  RENAL A:   -severe lactic acidosis at at admit - resolved   - currently ATN getting worse  P:   Hydrate 1L bolus and recheck bmet at 3pm (worsening AKI) Monitor BMP   GASTROINTESTINAL A:   - Cirrhosis NOS Child Class. Hepatic encephalopathy - Admited with Acute variceal bleed and s/p banding 08/18/13. No NG/OG per GI  P:    Lactulose enema Start xifaxan for hepatic enceph when gi allows ng tube placemtn.  Currently on IV cipro but change to IV ceftriaxone for sbp prophylaxis Ooctreotide and protonix gtt  HEMATOLOGIC A:  severe blood loss anemia.    - Hgb 7.3 in AM and appears to have slow GI bleed. GI aware and monitoring  P:  prbc for hgb > 7gm%  INFECTIOUS A:  No evidence of sepsis. Procalcitonin normal.   - Culture: 1/2 positive for GRAM POSITIVE COCCI IN CLUSTERS on 08/4113, pending final report  PLAN Monitor Change cipro (due to enceph) to ceftriaxone for SBP prophylaxis  ENDOCRINE A: Cortisol level 68. CBG 132 P:   monitor  NEUROLOGIC A:  Acute enceph likely liver source.   - ? Improving  P:   Dc cipro (see ID) lactulose enema Will have to wait altest 72h from 08/19/13  per Dr Bosie ClosSchooler before panda placement and start xifaxan; currently strick avoidance  Lorretta HarpXilin Niu, MD PGY3, Internal Medicine Teaching Service Pager: [phone]  STAFF NOTE: I, Dr Lavinia SharpsM Mirabella Hilario have personally reviewed patient's available data, including medical history, events of note, physical examination and test results as part of my evaluation. I  have discussed with resident/NP and other care providers such as pharmacist, RN and RRT.  In addition,  I personally evaluated  patient and elicited key findings of hepatic enceph -on lactulose. Will change cipro to ceftriaxone. Not ready for extubation. Monitor closely for worsening hgb, recheck 3pm. If repeat hgb < 7gm%, call GI again.   Rest per NP/medical resident whose note is outlined above and that I agree with  The patient is critically ill with multiple organ systems failure and requires high complexity decision making for assessment and support, frequent evaluation and titration of therapies, application of advanced monitoring technologies and extensive interpretation of multiple databases.   Critical Care Time devoted to patient care services described in this note is  35  Minutes.  Dr. Kalman Shan, M.D., [facility].C.P Pulmonary and Critical Care Medicine Staff Physician [facility] Pager: [phone], If no answer or between  15:00h - 7:00h: call [phone]  08/20/2013 11:15 AM

## 2013-08-21 ENCOUNTER — Encounter (HOSPITAL_COMMUNITY): Payer: Self-pay | Admitting: Gastroenterology

## 2013-08-21 LAB — CBC WITH DIFFERENTIAL/PLATELET
Basophils Absolute: 0 10*3/uL (ref 0.0–0.1)
Basophils Relative: 0 % (ref 0–1)
Eosinophils Absolute: 0.1 10*3/uL (ref 0.0–0.7)
Eosinophils Relative: 1 % (ref 0–5)
HEMATOCRIT: 23 % — AB (ref 39.0–52.0)
Hemoglobin: 7.4 g/dL — ABNORMAL LOW (ref 13.0–17.0)
LYMPHS PCT: 11 % — AB (ref 12–46)
Lymphs Abs: 0.7 10*3/uL (ref 0.7–4.0)
MCH: 23.4 pg — ABNORMAL LOW (ref 26.0–34.0)
MCHC: 32.2 g/dL (ref 30.0–36.0)
MCV: 72.8 fL — ABNORMAL LOW (ref 78.0–100.0)
Monocytes Absolute: 1.1 10*3/uL — ABNORMAL HIGH (ref 0.1–1.0)
Monocytes Relative: 17 % — ABNORMAL HIGH (ref 3–12)
NEUTROS ABS: 4.3 10*3/uL (ref 1.7–7.7)
Neutrophils Relative %: 71 % (ref 43–77)
Platelets: 52 10*3/uL — ABNORMAL LOW (ref 150–400)
RBC: 3.16 MIL/uL — ABNORMAL LOW (ref 4.22–5.81)
RDW: 21.1 % — ABNORMAL HIGH (ref 11.5–15.5)
WBC: 6.2 10*3/uL (ref 4.0–10.5)

## 2013-08-21 LAB — GLUCOSE, CAPILLARY
Glucose-Capillary: 122 mg/dL — ABNORMAL HIGH (ref 70–99)
Glucose-Capillary: 130 mg/dL — ABNORMAL HIGH (ref 70–99)
Glucose-Capillary: 141 mg/dL — ABNORMAL HIGH (ref 70–99)
Glucose-Capillary: 144 mg/dL — ABNORMAL HIGH (ref 70–99)
Glucose-Capillary: 147 mg/dL — ABNORMAL HIGH (ref 70–99)

## 2013-08-21 LAB — POCT I-STAT 3, ART BLOOD GAS (G3+)
ACID-BASE DEFICIT: 4 mmol/L — AB (ref 0.0–2.0)
Bicarbonate: 19.3 mEq/L — ABNORMAL LOW (ref 20.0–24.0)
O2 SAT: 99 %
PO2 ART: 148 mmHg — AB (ref 80.0–100.0)
Patient temperature: 98
TCO2: 20 mmol/L (ref 0–100)
pCO2 arterial: 25.6 mmHg — ABNORMAL LOW (ref 35.0–45.0)
pH, Arterial: 7.485 — ABNORMAL HIGH (ref 7.350–7.450)

## 2013-08-21 LAB — CBC
HCT: 23.6 % — ABNORMAL LOW (ref 39.0–52.0)
HEMATOCRIT: 23 % — AB (ref 39.0–52.0)
HEMOGLOBIN: 7.4 g/dL — AB (ref 13.0–17.0)
Hemoglobin: 7.5 g/dL — ABNORMAL LOW (ref 13.0–17.0)
MCH: 23.4 pg — AB (ref 26.0–34.0)
MCH: 23.4 pg — AB (ref 26.0–34.0)
MCHC: 32.2 g/dL (ref 30.0–36.0)
MCHC: 31.8 g/dL (ref 30.0–36.0)
MCV: 72.8 fL — ABNORMAL LOW (ref 78.0–100.0)
MCV: 73.5 fL — AB (ref 78.0–100.0)
PLATELETS: 59 10*3/uL — AB (ref 150–400)
Platelets: 61 10*3/uL — ABNORMAL LOW (ref 150–400)
RBC: 3.16 MIL/uL — AB (ref 4.22–5.81)
RBC: 3.21 MIL/uL — ABNORMAL LOW (ref 4.22–5.81)
RDW: 21.2 % — ABNORMAL HIGH (ref 11.5–15.5)
RDW: 21.3 % — AB (ref 11.5–15.5)
WBC: 5.9 10*3/uL (ref 4.0–10.5)
WBC: 4.5 10*3/uL (ref 4.0–10.5)

## 2013-08-21 LAB — BASIC METABOLIC PANEL
BUN: 83 mg/dL — ABNORMAL HIGH (ref 6–23)
CALCIUM: 7.3 mg/dL — AB (ref 8.4–10.5)
CO2: 16 meq/L — AB (ref 19–32)
Chloride: 117 mEq/L — ABNORMAL HIGH (ref 96–112)
Creatinine, Ser: 1.52 mg/dL — ABNORMAL HIGH (ref 0.50–1.35)
GFR calc Af Amer: 60 mL/min — ABNORMAL LOW (ref >90)
GFR calc non Af Amer: 51 mL/min — ABNORMAL LOW (ref >90)
GLUCOSE: 147 mg/dL — AB (ref 70–99)
Potassium: 3.7 mEq/L (ref 3.7–5.3)
Sodium: 146 mEq/L (ref 137–147)

## 2013-08-21 LAB — PHOSPHORUS: Phosphorus: 3.6 mg/dL (ref 2.3–4.6)

## 2013-08-21 LAB — MAGNESIUM: Magnesium: 2.8 mg/dL — ABNORMAL HIGH (ref 1.5–2.5)

## 2013-08-21 LAB — HEPATIC FUNCTION PANEL
ALBUMIN: 2.3 g/dL — AB (ref 3.5–5.2)
ALK PHOS: 44 U/L (ref 39–117)
ALT: 538 U/L — ABNORMAL HIGH (ref 0–53)
AST: 793 U/L — AB (ref 0–37)
BILIRUBIN TOTAL: 3 mg/dL — AB (ref 0.3–1.2)
Bilirubin, Direct: 2.5 mg/dL — ABNORMAL HIGH (ref 0.0–0.3)
Indirect Bilirubin: 0.5 mg/dL (ref 0.3–0.9)
TOTAL PROTEIN: 4.9 g/dL — AB (ref 6.0–8.3)

## 2013-08-21 MED ORDER — PANTOPRAZOLE SODIUM 40 MG IV SOLR
40.0000 mg | Freq: Two times a day (BID) | INTRAVENOUS | Status: DC
  Administered 2013-08-22 – 2013-08-24 (×6): 40 mg via INTRAVENOUS
  Filled 2013-08-21 (×9): qty 40

## 2013-08-21 MED ORDER — DEXTROSE 5 % IV SOLN
INTRAVENOUS | Status: DC
  Administered 2013-08-21: 13:00:00 via INTRAVENOUS
  Administered 2013-08-22: 100 mL via INTRAVENOUS
  Administered 2013-08-23: 01:00:00 via INTRAVENOUS
  Administered 2013-08-23: 1000 mL via INTRAVENOUS

## 2013-08-21 MED ORDER — CEFTRIAXONE SODIUM 1 G IJ SOLR: 1.0000 g | INTRAMUSCULAR | Status: DC

## 2013-08-21 MED ORDER — DEXTROSE 5 % IV SOLN
1.0000 g | INTRAVENOUS | Status: AC
  Administered 2013-08-21 – 2013-08-24 (×4): 1 g via INTRAVENOUS
  Filled 2013-08-21 (×4): qty 10

## 2013-08-21 NOTE — Progress Notes (Signed)
PULMONARY / CRITICAL CARE MEDICINE, progress note   Name: [name] PandaDavid [name] MRN: [id number] DOB: July 22, 1960    ADMISSION DATE:  08/18/2013 CONSULTATION DATE:  08/18/13  REFERRING MD :    CHIEF COMPLAINT:  Upper GIB  BRIEF PATIENT DESCRIPTION:   53 year old obese male with [place] documented Cirrhosis NOS (no ascites feb 2015), Hep C, small esophageal varices and diverticulsos (per chart bx), admitted for upper GIB, hepatic encephalopathy and severe lactic acidosis. Now he is s/p banding of varice. Transfused with 7 units of blood. Lactic acid level normalized on 4/5. hgb is 7.4 in AM.   Of note, admitted 07/06/13 to 07/09/13: with dark maroon stool. UGI scope did not show source of bleed on 07/07/13 and there was no other evidence of portal htn other than small varices. Due to 2011 - colonoscopy diverticulosis it was felt he migh have had colonic diverticualr bleed.   SIGNIFICANT EVENTS / STUDIES:   4/3 - admit, intubated, central line, aline  4/3- transfused 3 U of blood and 1 Unit of FFP 4/4- transfused 3 U of blood 4/3- CXR: No active disease. 4/3- EGD w/ band ligation of varices 4/4- X-ray abdo no evidence of free air. 1 more U prbc 4/5- transfused 1 U of blood  LINES / TUBES: 08/18/13- CVL>> 08/18/13-aline>> 4/3 ETT>>>  CULTURES: 4/3- Urine culture negative 4/3- Blood culture X 2, 1/2 positive STAPHYLOCOCCUS SPECIES (COAGULASE NEGATIVE) 4/5- Blood culture>>   ANTIBIOTICS: 4/3- cefriaxone>>4/3, 4/5 >> (SBP prophylaxis in setting of GIB) >> 4/3- cipro>>4/5 4/4- Vancomycine>>4/3  SUBJECTIVE:   Subjective:  -sedated, intubated. No new bloody stool per nurse   VITAL SIGNS: Temp:  [97.7 F (36.5 C)-99.1 F (37.3 C)] 97.7 F (36.5 C) (04/06 0335) Pulse Rate:  [70-109] 88 (04/06 0423) Resp:  [12-28] 14 (04/06 0423) BP: (100-158)/(51-82) 137/64 mmHg (04/06 0423) SpO2:  [95 %-100 %] 100 % (04/06 0423) Arterial Line BP: (67-148)/(44-79) 129/68 mmHg (04/06 0415) FiO2 (%):  [40 %] 40 %  (04/06 0423) Weight:  [315 lb 4.1 oz (143 kg)] 315 lb 4.1 oz (143 kg) (04/06 0400) HEMODYNAMICS: CVP:  [1 mmHg-13 mmHg] 13 mmHg VENTILATOR SETTINGS: Vent Mode:  [-] CPAP;PSV FiO2 (%):  [40 %] 40 % Set Rate:  [15 bmp-16 bmp] 16 bmp Vt Set:  [640 mL] 640 mL PEEP:  [5 cmH20] 5 cmH20 Pressure Support:  [5 cmH20-10 cmH20] 5 cmH20 Plateau Pressure:  [8 cmH20-12 cmH20] 12 cmH20 INTAKE / OUTPUT: Intake/Output     04/05 0701 - 04/06 0700   I.V. (mL/kg) 2668.3 (18.7)   Blood 327.5   IV Piggyback 50   Total Intake(mL/kg) 3045.8 (21.3)   Urine (mL/kg/hr) 1845 (0.5)   Total Output 1845   Net +1200.8         PHYSICAL EXAMINATION: General: Looks critically ill, intubated and sedated Neuro: reponds to stimulii HEENT: Neck supple, PEERL +  Cardiovascular: Tachycardic  Lungs: on vent +. Coarse breathing sound Abdomen: Obese, soft, Bowel sound present  Musculoskeletal: No cyanosis, no clubbing, no edema  Skin: intact  LABS:   PULMONARY  Recent Labs Lab 08/18/13 2340 08/19/13 0105 08/19/13 0411 08/19/13 1057 08/19/13 2025  PHART 7.108* 7.204* 7.372 7.444 7.445  PCO2ART 28.6* 22.8* 21.7* 22.4* 25.5*  PO2ART 148.0* 185.0* 166.0* 184.0* 155.0*  HCO3 9.1* 9.0* 12.6* 15.2* 17.1*  TCO2 10 10 13 16  17.8  O2SAT 98.0 99.0 100.0 100.0 99.5    CBC  Recent Labs Lab 08/20/13 1537 08/20/13 2020 08/21/13 0402  HGB 6.8* 7.6*  7.4*  HCT 20.9* 23.6* 23.0*  WBC 7.5 7.9 6.2  PLT 58* 65* 52*    COAGULATION  Recent Labs Lab 08/18/13 1815 08/18/13 1847 08/20/13 0425  INR 1.90* 1.71* 1.94*    CARDIAC    Recent Labs Lab 08/18/13 1847 08/19/13 0240 08/19/13 0500  TROPONINI <0.30 <0.30 <0.30    Recent Labs Lab 08/18/13 1847  PROBNP 51.7     CHEMISTRY  Recent Labs Lab 08/18/13 1847 08/19/13 0500 08/20/13 0425 08/20/13 1537 08/21/13 0402  NA 149* 145 146 147 146  K 5.3 5.2 3.8 3.9 3.7  CL 111 109 114* 116* 117*  CO2 8* 11* 17* 17* 16*  GLUCOSE 133* 166* 150*  157* 147*  BUN 49* 64* 89* 91* 83*  CREATININE 1.09 1.48* 1.92* 1.74* 1.52*  CALCIUM 8.6 8.0* 7.6* 7.5* 7.3*  MG 2.1 2.0 2.3  --  2.8*  PHOS 3.7 3.4 3.2  --  3.6   Estimated Creatinine Clearance: 90.2 ml/min (by C-G formula based on Cr of 1.52).   LIVER  Recent Labs Lab 08/18/13 1540 08/18/13 1815 08/18/13 1847 08/20/13 0425 08/21/13 0402  AST 102*  --  116*  --  793*  ALT 48  --  53  --  538*  ALKPHOS 51  --  51  --  44  BILITOT 1.1  --  1.3*  --  3.0*  PROT 5.8*  --  6.2  --  4.9*  ALBUMIN 2.8*  --  3.0*  --  2.3*  INR  --  1.90* 1.71* 1.94*  --      INFECTIOUS  Recent Labs Lab 08/18/13 1534 08/18/13 1847 08/19/13 0240 08/20/13 0425  LATICACIDVEN 16.89* 18.2* 12.7* 1.8  PROCALCITON  --  0.23  --   --      ENDOCRINE CBG (last 3)   Recent Labs  08/20/13 1954 08/21/13 0005 08/21/13 0425  GLUCAP 134* 141* 130*    IMAGING x48h  Dg Chest Port 1 View  08/20/2013   CLINICAL DATA:  Endotracheal tube  EXAM: PORTABLE CHEST - 1 VIEW  COMPARISON:  08/18/2013  FINDINGS: The cardiac shadow is stable. An endotracheal tube is again identified in satisfactory position just above the aortic knob. A right-sided central venous line is again seen and stable. The lungs are clear.  IMPRESSION: No acute abnormality noted.   Electronically Signed   By: Alcide Clever M.D.   On: 08/20/2013 09:29   Dg Abd Portable 2v  08/19/2013   CLINICAL DATA:  abdominal distention, variceal bleed, leucocytosis  EXAM: PORTABLE ABDOMEN - 2 VIEW  COMPARISON:  None.  FINDINGS: The bowel gas pattern is normal. There is no evidence of free air. No radio-opaque calculi or other significant radiographic abnormality is seen.  IMPRESSION: Negative.   Electronically Signed   By: Salome Holmes M.D.   On: 08/19/2013 10:56     ASSESSMENT / PLAN:  PULMONARY A:  Intubated for UGI scpe. Acute resp failure.   P:   -ABG last reviewed, keep same MV if remains on PRVC -wean today CPAP 5 PS 5, goal 2 hrs -will  want to re evaluate him on rest mode PRVC as opposed to Spont, may need to consider SIMV with rate 10  CARDIOVASCULAR A: Hemorrhagic shock @ admit 08/18/2013.  Improved   - off pressors 08/20/13  P:   Tele Even balance goals  RENAL A:   - Severe lactic acidosis at at admit - resolved - currently ATN, improved -NON AG process  P:   Avoid nacl Monitor BMP Add d5w at 30   GASTROINTESTINAL A:   - Cirrhosis NOS Child Class. Hepatic encephalopathy - Admited with Acute variceal bleed and s/p banding 08/18/13. No NG/OG per GI -shock liver? P:    Lactulose enema Start xifaxan for hepatic enceph when gi allows ng tube placemtn. Will ask GI can we place NGT Currently on IV ceftriaxone for sbp prophylaxis Octreotide and protonix gtt, need stop date Will assess pre ICU nutritional status, avoid TPN as able Follow LFT again in am   HEMATOLOGIC A:  Severe blood loss anemia.    - Hgb 7.4 in AM and appears to have slow GI bleed. GI aware and monitoring  P:  prbc for hgb > 7gm% Cbc change to q12h scd  INFECTIOUS A:  No evidence of sepsis. Procalcitonin normal.   - blood Culture 4/3: 1/2 positive for Staph (coagulase negative), likely contamination. - Pending second blood culture on 4/5>>  P: Monitor Ceftriaxone for SBP prophylaxis, ad stop date 7 days  ENDOCRINE A: Cortisol level 68. CBG 130 in AM P:   Monitor glu with addition d5w  NEUROLOGIC A:  Acute enceph likely liver source.  - ? Improving  P:   lactulose enema Will have to wait altest 72h from 08/19/13  per Dr Bosie Clos before [name] placement and start xifaxan; currently strick avoidance Propofol as needed with Joaquin Bend, MD PGY3, Internal Medicine Teaching Service Pager: [phone]   The patient is critically ill with multiple organ systems failure and requires high complexity decision making for assessment and support, frequent evaluation and titration of therapies, application of advanced monitoring  technologies and extensive interpretation of multiple databases.   Critical Care Time devoted to patient care services described in this note is  35  Minutes.  [name]. Tyson Alias, MD, FACP Pgr: [phone] [facility]

## 2013-08-21 NOTE — Progress Notes (Signed)
Patient ID: [name] PandaDavid [name], male   DOB: Oct 29, 1960, 53 y.o.   MRN: [id number] I have had extensive discussions with family wife, seperated. We discussed patients current circumstances and organ failures. We also discussed patient's prior wishes under circumstances such as this. Family has decided to NOT perform resuscitation if arrest but to continue current medical support for now. NO further endo, limit prbc to 9 units No reintubation in future Comfort in 48 hr if not improved  Mcarthur Rossettianiel J. Tyson [name], MD, FACP Pgr: [phone] [facility]

## 2013-08-21 NOTE — ED Provider Notes (Signed)
I saw and evaluated the patient, reviewed the resident's note and I agree with the findings and plan.   .Face to face Exam:  General: Obtunded HEENT:  Atraumatic Resp:  Normal effort Abd:  Nondistended Neuro:No focal weakness Lymph: No adenopathy  CRITICAL CARE Performed by: Nelva NayBEATON,Wajiha Versteeg L Total critical care time: 45 min Critical care time was exclusive of separately billable procedures and treating other patients. Critical care was necessary to treat or prevent imminent or life-threatening deterioration. Critical care was time spent personally by me on the following activities: development of treatment plan with patient and/or surrogate as well as nursing, discussions with consultants, evaluation of patient's response to treatment, examination of patient, obtaining history from patient or surrogate, ordering and performing treatments and interventions, ordering and review of laboratory studies, ordering and review of radiographic studies, pulse oximetry and re-evaluation of patient's condition.    Nelia Shiobert L Tamelia Michalowski, MD 08/21/13 [phone]

## 2013-08-21 NOTE — Progress Notes (Signed)
Subjective: Mild blood per rectum after lactulose enema. Otherwise no evidence of acute GI tract bleeding.  Objective: Vital signs in last 24 hours: Temp:  [97.7 F (36.5 C)-99.1 F (37.3 C)] 97.8 F (36.6 C) (04/06 1000) Pulse Rate:  [70-97] 86 (04/06 1000) Resp:  [12-28] 21 (04/06 1000) BP: (100-158)/(51-79) 142/68 mmHg (04/06 1000) SpO2:  [98 %-100 %] 100 % (04/06 1000) Arterial Line BP: (67-141)/(44-79) 78/67 mmHg (04/06 0800) FiO2 (%):  [40 %] 40 % (04/06 0840) Weight:  [143 kg (315 lb 4.1 oz)] 143 kg (315 lb 4.1 oz) (04/06 0400) Weight change: 4.2 kg (9 lb 4.1 oz) Last BM Date: 08/20/13  PE: GEN:  Intubated, not responsive ABD:  Moderate distention, soft, hypoactive bowel sounds  Lab Results: CBC    Component Value Date/Time   WBC 5.9 08/21/2013 0842   RBC 3.16* 08/21/2013 0842   HGB 7.4* 08/21/2013 0842   HCT 23.0* 08/21/2013 0842   PLT 61* 08/21/2013 0842   MCV 72.8* 08/21/2013 0842   MCH 23.4* 08/21/2013 0842   MCHC 32.2 08/21/2013 0842   RDW 21.2* 08/21/2013 0842   LYMPHSABS 0.7 08/21/2013 0402   MONOABS 1.1* 08/21/2013 0402   EOSABS 0.1 08/21/2013 0402   BASOSABS 0.0 08/21/2013 0402   CMP     Component Value Date/Time   NA 146 08/21/2013 0402   K 3.7 08/21/2013 0402   CL 117* 08/21/2013 0402   CO2 16* 08/21/2013 0402   GLUCOSE 147* 08/21/2013 0402   BUN 83* 08/21/2013 0402   CREATININE 1.52* 08/21/2013 0402   CALCIUM 7.3* 08/21/2013 0402   PROT 4.9* 08/21/2013 0402   ALBUMIN 2.3* 08/21/2013 0402   AST 793* 08/21/2013 0402   ALT 538* 08/21/2013 0402   ALKPHOS 44 08/21/2013 0402   BILITOT 3.0* 08/21/2013 0402   GFRNONAA 51* 08/21/2013 0402   GFRAA 60* 08/21/2013 0402   Assessment:  1.  Upper GI bleed, likely variceal, endoscopic esophageal band ligation performed.  No evidence of ongoing rampant bleeding. 2.  Acute blood loss anemia. 3.   Intubated, failure to wean from ventilator.  Plan:  1.  Continue PPI, octreotide, antibiotics. 2.  Follow CBCs; transfuse as needed; would not repeat  endoscopy at present time, could reconsider if acute onset of rampant bleeding. 3.  Prognosis guarded.  Critical Care notes reviewed; if unable to wake up and wean from ventilator within the next couple days, will consider more palliative management measures. 4.  Will follow.   [name] [name],Racheal Mathurin M 08/21/2013, 1:10 PM

## 2013-08-21 NOTE — Care Management Note (Signed)
    Page 1 of 1   08/21/2013     3:11:16 PM   CARE MANAGEMENT NOTE 08/21/2013  Patient:  Francesconi,Karanveer   Account Number:  [id number]  Date Initiated:  08/21/2013  Documentation initiated by:  Avie ArenasBROWN,Jaber Dunlow  Subjective/Objective Assessment:   GIB     Action/Plan:   Anticipated DC Date:  08/25/2013   Anticipated DC Plan:  HOSPICE MEDICAL FACILITY      DC Planning Services  CM consult      Choice offered to / List presented to:             Status of service:  In process, will continue to follow Medicare Important Message given?   (If response is "NO", the following Medicare IM given date fields will be blank) Date Medicare IM given:   Date Additional Medicare IM given:    Discharge Disposition:    Per UR Regulation:  Reviewed for med. necessity/level of care/duration of stay  If discussed at Long Length of Stay Meetings, dates discussed:    Comments:  ContactVincent Peyer:  [place],Karen Spouse [phone]   [phone]   08-21-13 3pm Avie ArenasSarah Cully Luckow, RNBSN [phone] Patient is married but separated from wife and lives in half way house.  Wife feels needs more care and would like to discuss higher level of care.  SW consult placed.

## 2013-08-22 ENCOUNTER — Inpatient Hospital Stay (HOSPITAL_COMMUNITY): Payer: Medicare Other

## 2013-08-22 LAB — HEPATIC FUNCTION PANEL
ALT: 441 U/L — AB (ref 0–53)
AST: 426 U/L — AB (ref 0–37)
Albumin: 2.3 g/dL — ABNORMAL LOW (ref 3.5–5.2)
Alkaline Phosphatase: 48 U/L (ref 39–117)
Bilirubin, Direct: 4.3 mg/dL — ABNORMAL HIGH (ref 0.0–0.3)
Indirect Bilirubin: 0.6 mg/dL (ref 0.3–0.9)
TOTAL PROTEIN: 5 g/dL — AB (ref 6.0–8.3)
Total Bilirubin: 4.9 mg/dL — ABNORMAL HIGH (ref 0.3–1.2)

## 2013-08-22 LAB — TYPE AND SCREEN
ABO/RH(D): O POS
ANTIBODY SCREEN: NEGATIVE
UNIT DIVISION: 0
UNIT DIVISION: 0
UNIT DIVISION: 0
Unit division: 0
Unit division: 0
Unit division: 0
Unit division: 0
Unit division: 0

## 2013-08-22 LAB — BASIC METABOLIC PANEL
BUN: 65 mg/dL — AB (ref 6–23)
BUN: 51 mg/dL — ABNORMAL HIGH (ref 6–23)
CALCIUM: 7.7 mg/dL — AB (ref 8.4–10.5)
CO2: 18 mEq/L — ABNORMAL LOW (ref 19–32)
CO2: 19 mEq/L (ref 19–32)
CREATININE: 1.21 mg/dL (ref 0.50–1.35)
Calcium: 7.7 mg/dL — ABNORMAL LOW (ref 8.4–10.5)
Chloride: 124 mEq/L — ABNORMAL HIGH (ref 96–112)
Chloride: 128 mEq/L — ABNORMAL HIGH (ref 96–112)
Creatinine, Ser: 1.33 mg/dL (ref 0.50–1.35)
GFR calc Af Amer: 70 mL/min — ABNORMAL LOW (ref >90)
GFR calc non Af Amer: 60 mL/min — ABNORMAL LOW (ref >90)
GFR, EST AFRICAN AMERICAN: 79 mL/min — AB (ref >90)
GFR, EST NON AFRICAN AMERICAN: 68 mL/min — AB (ref >90)
GLUCOSE: 153 mg/dL — AB (ref 70–99)
Glucose, Bld: 164 mg/dL — ABNORMAL HIGH (ref 70–99)
POTASSIUM: 3.6 meq/L — AB (ref 3.7–5.3)
Potassium: 3.4 mEq/L — ABNORMAL LOW (ref 3.7–5.3)
Sodium: 153 mEq/L — ABNORMAL HIGH (ref 137–147)
Sodium: 158 mEq/L — ABNORMAL HIGH (ref 137–147)

## 2013-08-22 LAB — DRUGS OF ABUSE SCREEN W/O ALC, ROUTINE URINE
Amphetamine Screen, Ur: NEGATIVE
BENZODIAZEPINES.: POSITIVE — AB
Barbiturate Quant, Ur: NEGATIVE
Cocaine Metabolites: NEGATIVE
Creatinine,U: 178.1 mg/dL
Marijuana Metabolite: NEGATIVE
Methadone: NEGATIVE
OPIATE SCREEN, URINE: NEGATIVE
PROPOXYPHENE: NEGATIVE
Phencyclidine (PCP): NEGATIVE

## 2013-08-22 LAB — PHOSPHORUS: Phosphorus: 3.3 mg/dL (ref 2.3–4.6)

## 2013-08-22 LAB — CBC
HCT: 24 % — ABNORMAL LOW (ref 39.0–52.0)
HEMATOCRIT: 22.6 % — AB (ref 39.0–52.0)
HEMOGLOBIN: 7 g/dL — AB (ref 13.0–17.0)
HEMOGLOBIN: 7.4 g/dL — AB (ref 13.0–17.0)
MCH: 22.9 pg — AB (ref 26.0–34.0)
MCH: 23.1 pg — AB (ref 26.0–34.0)
MCHC: 31 g/dL (ref 30.0–36.0)
MCHC: 30.8 g/dL (ref 30.0–36.0)
MCV: 73.9 fL — AB (ref 78.0–100.0)
MCV: 75 fL — AB (ref 78.0–100.0)
Platelets: 53 10*3/uL — ABNORMAL LOW (ref 150–400)
Platelets: 62 10*3/uL — ABNORMAL LOW (ref 150–400)
RBC: 3.06 MIL/uL — AB (ref 4.22–5.81)
RBC: 3.2 MIL/uL — ABNORMAL LOW (ref 4.22–5.81)
RDW: 21.5 % — ABNORMAL HIGH (ref 11.5–15.5)
RDW: 21.6 % — AB (ref 11.5–15.5)
WBC: 3.4 10*3/uL — AB (ref 4.0–10.5)
WBC: 4.1 10*3/uL (ref 4.0–10.5)

## 2013-08-22 LAB — PREALBUMIN: Prealbumin: 8.3 mg/dL — ABNORMAL LOW (ref 17.0–34.0)

## 2013-08-22 LAB — MAGNESIUM: Magnesium: 3.1 mg/dL — ABNORMAL HIGH (ref 1.5–2.5)

## 2013-08-22 MED ORDER — LACTULOSE ENEMA
300.0000 mL | Freq: Three times a day (TID) | ORAL | Status: DC
  Administered 2013-08-22 – 2013-08-23 (×4): 300 mL via RECTAL
  Filled 2013-08-22 (×8): qty 300

## 2013-08-22 NOTE — Clinical Social Work Note (Signed)
CSW received notification that pt family/wife is requesting to speak with CSW regarding possible placement options for pt.  CSW attempted to contact Clydie BraunKaren, pt wife at [phone].  CSW awaiting a return call.  Vickii PennaGina Damarien Nyman, LCSWA [phone]  Clinical Social Work

## 2013-08-22 NOTE — Progress Notes (Signed)
Extensive conversation with patients brother, Aldona BarGarry Lempke, from [place]. This gentleman is expressing concerns about patients status and future. Attempted to discuss patients plans with this family member as far as "pulling the tube" at a certain point and where that would leave the patient. Having trouble getting him to verbalize concerns but seems that his questions may be better answered by social work consult.   Contact info: Aldona BarGarry Bagent [phone]

## 2013-08-22 NOTE — Progress Notes (Signed)
Subjective: Mental status improving somewhat per nursing. No further bleeding.  Objective: Vital signs in last 24 hours: Temp:  [98 F (36.7 C)-99.2 F (37.3 C)] 98.6 F (37 C) (04/07 0700) Pulse Rate:  [78-86] 86 (04/07 0840) Resp:  [12-27] 18 (04/07 0840) BP: (108-148)/(59-82) 124/72 mmHg (04/07 0840) SpO2:  [99 %-100 %] 100 % (04/07 0840) Arterial Line BP: (71-155)/(47-79) 134/47 mmHg (04/06 2000) FiO2 (%):  [40 %] 40 % (04/07 0840) Weight:  [142.1 kg (313 lb 4.4 oz)] 142.1 kg (313 lb 4.4 oz) (04/07 0400) Weight change: -0.9 kg (-1 lb 15.7 oz) Last BM Date: 08/20/13  PE: GEN:  Intubated, waking up a bit more per nursing ABD:  Soft, mild distended, hypoactive bowel sounds  Lab Results: CBC    Component Value Date/Time   WBC 3.4* 08/22/2013 0415   RBC 3.06* 08/22/2013 0415   HGB 7.0* 08/22/2013 0415   HCT 22.6* 08/22/2013 0415   PLT 53* 08/22/2013 0415   MCV 73.9* 08/22/2013 0415   MCH 22.9* 08/22/2013 0415   MCHC 31.0 08/22/2013 0415   RDW 21.5* 08/22/2013 0415   LYMPHSABS 0.7 08/21/2013 0402   MONOABS 1.1* 08/21/2013 0402   EOSABS 0.1 08/21/2013 0402   BASOSABS 0.0 08/21/2013 0402   CMP     Component Value Date/Time   NA 153* 08/22/2013 0415   K 3.6* 08/22/2013 0415   CL 124* 08/22/2013 0415   CO2 18* 08/22/2013 0415   GLUCOSE 153* 08/22/2013 0415   BUN 65* 08/22/2013 0415   CREATININE 1.33 08/22/2013 0415   CALCIUM 7.7* 08/22/2013 0415   PROT 5.0* 08/22/2013 0415   ALBUMIN 2.3* 08/22/2013 0415   AST 426* 08/22/2013 0415   ALT 441* 08/22/2013 0415   ALKPHOS 48 08/22/2013 0415   BILITOT 4.9* 08/22/2013 0415   GFRNONAA 60* 08/22/2013 0415   GFRAA 70* 08/22/2013 0415   Assessment:  1.  Upper GI bleeding, resolved, presumed variceal, post endoscopic esophageal variceal band ligation. 2.  Anemia.  Suspect initially acute blood loss.  Slow downtrend, but no overt continued bleeding. 3.  Altered mental status, remains on ventilator.  Mental status improving some per nursing. 4.  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 5.  Elevated LFTs, uptrending since admission.  Suspect underlying elevation due to cirrhosis, and likely contribution acutely from his admission hypovolemia from GI bleeding.  Plan:  1.  Continue Protonix. 2.  Continue antibiotics. 3.  OK to stay off octreotide, given lack of further overt bleeding. 4.  May benefit from blood transfusion, at discretion of primary team. 5.  Continue lactulose enemas. 6.  If LFTs continue to rise, would consider repeat abdominal ultrasound (has history of gallstones; to assess for biliary ductal dilatation and/or liver lesions). 7.  Attempted ventilator weaning per primary team. 8.  [facility] will follow.   Freddy JakschUTLAW,Kapono Luhn M 08/22/2013, 10:35 AM

## 2013-08-22 NOTE — Progress Notes (Signed)
PULMONARY / CRITICAL CARE MEDICINE, progress note   Name: [name] [name] MRN: [id number] DOB: 11-Jun-1960    ADMISSION DATE:  08/18/2013 CONSULTATION DATE:  08/18/13  REFERRING MD :    CHIEF COMPLAINT:  Upper GIB  BRIEF PATIENT DESCRIPTION:   53 year old obese male with [place] documented Cirrhosis NOS (no ascites feb 2015), Hep C, small esophageal varices and diverticulsos (per chart bx), admitted for upper GIB, hepatic encephalopathy and severe lactic acidosis. Now he is s/p banding of varice. Transfused with 7 units of blood. Lactic acid level normalized on 4/5. hgb is 7.0 in AM.   Of note, admitted 07/06/13 to 07/09/13: with dark maroon stool. UGI scope did not show source of bleed on 07/07/13 and there was no other evidence of portal htn other than small varices. Due to 2011 - colonoscopy diverticulosis it was felt he migh have had colonic diverticualr bleed.   SIGNIFICANT EVENTS / STUDIES:   4/3 - admit, intubated, central line, aline  4/3- transfused 3 U of blood and 1 Unit of FFP 4/4- transfused 3 U of blood 4/3- CXR: No active disease. 4/3- EGD w/ band ligation of varices 4/4- X-ray abdo no evidence of free air. 1 more U prbc 4/5- transfused 1 U of blood 4/7- DNR established  LINES / TUBES: 08/18/13- CVL>> 08/18/13-aline>>4/6 4/3 ETT>>>  CULTURES: 4/3- Urine culture negative 4/3- Blood culture X 2, 1/2 positive STAPHYLOCOCCUS SPECIES (COAGULASE NEGATIVE) 4/5- Blood culture>>   ANTIBIOTICS: 4/3- cefriaxone>>4/3, 4/5 >> (SBP prophylaxis in setting of GIB) >> 4/3- cipro>>4/5 4/4- Vancomycine>>4/3  SUBJECTIVE:   Subjective:  -sedated, intubated. Had one brown color the loose stool after laculos enema.    VITAL SIGNS: Temp:  [97.8 F (36.6 C)-99.2 F (37.3 C)] 98.7 F (37.1 C) (04/07 0400) Pulse Rate:  [78-97] 78 (04/07 0400) Resp:  [12-27] 17 (04/07 0400) BP: (108-158)/(59-82) 118/65 mmHg (04/07 0400) SpO2:  [99 %-100 %] 100 % (04/07 0400) Arterial Line BP:  (71-155)/(47-79) 134/47 mmHg (04/06 2000) FiO2 (%):  [40 %] 40 % (04/07 0352) Weight:  [313 lb 4.4 oz (142.1 kg)] 313 lb 4.4 oz (142.1 kg) (04/07 0400) HEMODYNAMICS: CVP:  [8 mmHg] 8 mmHg VENTILATOR SETTINGS: Vent Mode:  [-] CPAP;PSV FiO2 (%):  [40 %] 40 % PEEP:  [5 cmH20] 5 cmH20 Pressure Support:  [5 cmH20-8 cmH20] 8 cmH20 INTAKE / OUTPUT: Intake/Output     04/06 0701 - 04/07 0700   I.V. (mL/kg) 2176 (15.3)   Other 1000   IV Piggyback 50   Total Intake(mL/kg) 3226 (22.7)   Urine (mL/kg/hr) 2265 (0.7)   Stool 175 (0.1)   Total Output 2440   Net +786         PHYSICAL EXAMINATION: General: Looks critically ill, intubated and sedated Neuro: reponds to stimulii HEENT: Neck supple, PEERL +  Cardiovascular: Tachycardic resolved, s 1 s 2 Lungs: on vent +. Coarse breathing sound Abdomen: Obese, soft, Bowel sound present  Musculoskeletal: No cyanosis, no clubbing, no edema  Skin: intact  LABS:   PULMONARY  Recent Labs Lab 08/19/13 0105 08/19/13 0411 08/19/13 1057 08/19/13 2025 08/21/13 1825  PHART 7.204* 7.372 7.444 7.445 7.485*  PCO2ART 22.8* 21.7* 22.4* 25.5* 25.6*  PO2ART 185.0* 166.0* 184.0* 155.0* 148.0*  HCO3 9.0* 12.6* 15.2* 17.1* 19.3*  TCO2 10 13 16  17.8 20  O2SAT 99.0 100.0 100.0 99.5 99.0    CBC  Recent Labs Lab 08/21/13 0842 08/21/13 1800 08/22/13 0415  HGB 7.4* 7.5* 7.0*  HCT 23.0* 23.6* 22.6*  WBC 5.9 4.5 3.4*  PLT 61* 59* 53*    COAGULATION  Recent Labs Lab 08/18/13 1815 08/18/13 1847 08/20/13 0425  INR 1.90* 1.71* 1.94*    CARDIAC    Recent Labs Lab 08/18/13 1847 08/19/13 0240 08/19/13 0500  TROPONINI <0.30 <0.30 <0.30    Recent Labs Lab 08/18/13 1847  PROBNP 51.7     CHEMISTRY  Recent Labs Lab 08/18/13 1847 08/19/13 0500 08/20/13 0425 08/20/13 1537 08/21/13 0402 08/22/13 0415  NA 149* 145 146 147 146 153*  K 5.3 5.2 3.8 3.9 3.7 3.6*  CL 111 109 114* 116* 117* 124*  CO2 8* 11* 17* 17* 16* 18*  GLUCOSE  133* 166* 150* 157* 147* 153*  BUN 49* 64* 89* 91* 83* 65*  CREATININE 1.09 1.48* 1.92* 1.74* 1.52* 1.33  CALCIUM 8.6 8.0* 7.6* 7.5* 7.3* 7.7*  MG 2.1 2.0 2.3  --  2.8* 3.1*  PHOS 3.7 3.4 3.2  --  3.6 3.3   Estimated Creatinine Clearance: 102.7 ml/min (by C-G formula based on Cr of 1.33).   LIVER  Recent Labs Lab 08/18/13 1540 08/18/13 1815 08/18/13 1847 08/20/13 0425 08/21/13 0402 08/22/13 0415  AST 102*  --  116*  --  793* 426*  ALT 48  --  53  --  538* 441*  ALKPHOS 51  --  51  --  44 48  BILITOT 1.1  --  1.3*  --  3.0* 4.9*  PROT 5.8*  --  6.2  --  4.9* 5.0*  ALBUMIN 2.8*  --  3.0*  --  2.3* 2.3*  INR  --  1.90* 1.71* 1.94*  --   --      INFECTIOUS  Recent Labs Lab 08/18/13 1534 08/18/13 1847 08/19/13 0240 08/20/13 0425  LATICACIDVEN 16.89* 18.2* 12.7* 1.8  PROCALCITON  --  0.23  --   --      ENDOCRINE CBG (last 3)   Recent Labs  08/21/13 0913 08/21/13 1603 08/21/13 1847  GLUCAP 144* 147* 122*    IMAGING x48h  Dg Chest Port 1 View  08/20/2013   CLINICAL DATA:  Endotracheal tube  EXAM: PORTABLE CHEST - 1 VIEW  COMPARISON:  08/18/2013  FINDINGS: The cardiac shadow is stable. An endotracheal tube is again identified in satisfactory position just above the aortic knob. A right-sided central venous line is again seen and stable. The lungs are clear.  IMPRESSION: No acute abnormality noted.   Electronically Signed   By: Alcide Clever M.D.   On: 08/20/2013 09:29     ASSESSMENT / PLAN:  PULMONARY A:  Intubated for UGI scpe. Acute resp failure.   P:   - ABG showed respiratory alkalosis on ABG 18:25 PM on 08/21/13 -maintain cpap 5 ps 5, has good drive - may need fent for RR greater 24  CARDIOVASCULAR A: Hemorrhagic shock @ admit 08/18/2013.  Improved   - off pressors 08/20/13  P:   Tele Even balance goals Doubt escalation to pressors would change outcome, wife would not want  RENAL A:   - Severe lactic acidosis at at admit - resolved - currently  ATN, improved - NON AG process -hypernatremia  P:   Avoid nacl Monitor BMP Increase d5w to 80 cc/hr, bmet q12h while on this    GASTROINTESTINAL A:   - Cirrhosis NOS Child Class. Hepatic encephalopathy - Admited with Acute variceal bleed and s/p banding 08/18/13. No NG/OG per GI - shock liver? AST and ALT improved P:    Lactulose  enema increase tid  Start xifaxan for hepatic enceph when gi allows ng tube placemtn Currently on IV ceftriaxone for sbp prophylaxis, stop date in place Octreotide, wean to off Switched Protonix gtt to IV 40 mg bid Prealbumin 8.3-->start tube feeding when safe  HEMATOLOGIC A:  Severe blood loss anemia.   - Hgb 7.0 in AM and appears to have slow GI bleed. GI aware and monitoring Stop at 9 units , d/w wife P:  prbc for hgb > 7gm% Cbc am, limit phlebotomy scd  INFECTIOUS A:  No evidence of sepsis. Procalcitonin normal.   - blood Culture 4/3: 1/2 positive for Staph (coagulase negative), likely contamination. - Pending second blood culture on 4/5>>  P: Monitor Ceftriaxone for SBP prophylaxis for 7 days  ENDOCRINE A: Cortisol level 68. CBG 153 in AM P:   Monitor glu with addition d5w  NEUROLOGIC A:  Acute enceph likely liver source.  - 4/7 Improving  P:   lactulose enema to tid Will have to wait altest 72h from 08/19/13  per Dr Bosie ClosSchooler before panda placement and start xifaxan; currently strick avoidance Propofol as needed with wua, dc if able No benzo  Lorretta HarpXilin Niu, MD PGY3, Internal Medicine Teaching Service Pager: [phone]   The patient is critically ill with multiple organ systems failure and requires high complexity decision making for assessment and support, frequent evaluation and titration of therapies, application of advanced monitoring technologies and extensive interpretation of multiple databases.   Critical Care Time devoted to patient care services described in this note is  35  Minutes.  Mcarthur Rossettianiel J. Tyson [name], MD, FACP Pgr:  [phone] [facility]

## 2013-08-23 LAB — HEPATIC FUNCTION PANEL
ALBUMIN: 2.4 g/dL — AB (ref 3.5–5.2)
ALK PHOS: 56 U/L (ref 39–117)
ALT: 296 U/L — ABNORMAL HIGH (ref 0–53)
AST: 186 U/L — ABNORMAL HIGH (ref 0–37)
Bilirubin, Direct: 4.9 mg/dL — ABNORMAL HIGH (ref 0.0–0.3)
Indirect Bilirubin: 1.2 mg/dL — ABNORMAL HIGH (ref 0.3–0.9)
TOTAL PROTEIN: 5.4 g/dL — AB (ref 6.0–8.3)
Total Bilirubin: 6.1 mg/dL — ABNORMAL HIGH (ref 0.3–1.2)

## 2013-08-23 LAB — CBC
HCT: 23.5 % — ABNORMAL LOW (ref 39.0–52.0)
HEMOGLOBIN: 7.1 g/dL — AB (ref 13.0–17.0)
MCH: 22.8 pg — AB (ref 26.0–34.0)
MCHC: 30.2 g/dL (ref 30.0–36.0)
MCV: 75.6 fL — ABNORMAL LOW (ref 78.0–100.0)
PLATELETS: 54 10*3/uL — AB (ref 150–400)
RBC: 3.11 MIL/uL — ABNORMAL LOW (ref 4.22–5.81)
RDW: 21.8 % — ABNORMAL HIGH (ref 11.5–15.5)
WBC: 3.6 10*3/uL — ABNORMAL LOW (ref 4.0–10.5)

## 2013-08-23 LAB — BASIC METABOLIC PANEL
BUN: 43 mg/dL — ABNORMAL HIGH (ref 6–23)
BUN: 35 mg/dL — AB (ref 6–23)
CALCIUM: 7.8 mg/dL — AB (ref 8.4–10.5)
CALCIUM: 7.5 mg/dL — AB (ref 8.4–10.5)
CO2: 19 mEq/L (ref 19–32)
CO2: 18 mEq/L — ABNORMAL LOW (ref 19–32)
Chloride: 127 mEq/L — ABNORMAL HIGH (ref 96–112)
Chloride: 122 mEq/L — ABNORMAL HIGH (ref 96–112)
Creatinine, Ser: 1.25 mg/dL (ref 0.50–1.35)
Creatinine, Ser: 1.07 mg/dL (ref 0.50–1.35)
GFR calc Af Amer: 75 mL/min — ABNORMAL LOW (ref >90)
GFR calc Af Amer: >90 mL/min (ref >90)
GFR, EST NON AFRICAN AMERICAN: 65 mL/min — AB (ref >90)
GFR, EST NON AFRICAN AMERICAN: 79 mL/min — AB (ref >90)
GLUCOSE: 152 mg/dL — AB (ref 70–99)
GLUCOSE: 145 mg/dL — AB (ref 70–99)
Potassium: 3.4 mEq/L — ABNORMAL LOW (ref 3.7–5.3)
Potassium: 3.5 mEq/L — ABNORMAL LOW (ref 3.7–5.3)
Sodium: 158 mEq/L — ABNORMAL HIGH (ref 137–147)
Sodium: 152 mEq/L — ABNORMAL HIGH (ref 137–147)

## 2013-08-23 LAB — BENZODIAZEPINE, QUANTITATIVE, URINE
Alprazolam metabolite (GC/LC/MS), ur confirm: NEGATIVE ng/mL
Clonazepam metabolite (GC/LC/MS), ur confirm: NEGATIVE ng/mL
Diazepam (GC/LC/MS), ur confirm: NEGATIVE ng/mL
Estazolam (GC/LC/MS), ur confirm: NEGATIVE ng/mL
Flunitrazepam metabolite (GC/LC/MS), ur confirm: NEGATIVE ng/mL
Flurazepam GC/MS Conf: NEGATIVE ng/mL
HYDROXYALPRAZ UR: NEGATIVE ng/mL
LORAZEPAMU: NEGATIVE ng/mL
Midazolam (GC/LC/MS), ur confirm: 3718 ng/mL
Nordiazepam GC/MS Conf: NEGATIVE ng/mL
Oxazepam GC/MS Conf: NEGATIVE ng/mL
TRIAZOLAMU: NEGATIVE ng/mL
Temazepam GC/MS Conf: NEGATIVE ng/mL

## 2013-08-23 LAB — GLUCOSE, CAPILLARY
Glucose-Capillary: 136 mg/dL — ABNORMAL HIGH (ref 70–99)
Glucose-Capillary: 150 mg/dL — ABNORMAL HIGH (ref 70–99)
Glucose-Capillary: 154 mg/dL — ABNORMAL HIGH (ref 70–99)

## 2013-08-23 LAB — PHOSPHORUS: Phosphorus: 2.9 mg/dL (ref 2.3–4.6)

## 2013-08-23 LAB — MAGNESIUM: Magnesium: 3 mg/dL — ABNORMAL HIGH (ref 1.5–2.5)

## 2013-08-23 MED ORDER — POTASSIUM CHLORIDE 10 MEQ/50ML IV SOLN
10.0000 meq | INTRAVENOUS | Status: AC
  Administered 2013-08-23 (×4): 10 meq via INTRAVENOUS
  Filled 2013-08-23: qty 50

## 2013-08-23 MED ORDER — VITAMIN K1 10 MG/ML IJ SOLN
10.0000 mg | Freq: Once | INTRAVENOUS | Status: AC
  Administered 2013-08-23: 10 mg via INTRAVENOUS
  Filled 2013-08-23: qty 1

## 2013-08-23 MED ORDER — BIOTENE DRY MOUTH MT LIQD
15.0000 mL | Freq: Two times a day (BID) | OROMUCOSAL | Status: DC
  Administered 2013-08-24 – 2013-08-30 (×8): 15 mL via OROMUCOSAL

## 2013-08-23 MED ORDER — POTASSIUM CHLORIDE 10 MEQ/100ML IV SOLN: 10.0000 meq | INTRAVENOUS | Status: DC

## 2013-08-23 NOTE — Progress Notes (Signed)
Subjective: Extubated this morning; is thirsty no other complaints. Denies abdominal pain. No recurrent bleeding.  Objective: Vital signs in last 24 hours: Temp:  [98.6 F (37 C)-100.8 F (38.2 C)] 98.6 F (37 C) (04/08 1159) Pulse Rate:  [80-107] 81 (04/08 1500) Resp:  [13-25] 17 (04/08 1500) BP: (116-169)/(59-88) 127/74 mmHg (04/08 1200) SpO2:  [91 %-100 %] 98 % (04/08 1500) FiO2 (%):  [40 %] 40 % (04/08 1000) Weight:  [142 kg (313 lb 0.9 oz)] 142 kg (313 lb 0.9 oz) (04/08 0400) Weight change: -0.1 kg (-3.5 oz) Last BM Date: 08/23/13  PE: GEN:  Awake, cognitive slowing but can answer questions appropriately HEENT:  Scleral icterus bilaterally. ABD:  Mild distention, soft, non-tender  Lab Results: CBC    Component Value Date/Time   WBC 3.6* 08/23/2013 0750   RBC 3.11* 08/23/2013 0750   HGB 7.1* 08/23/2013 0750   HCT 23.5* 08/23/2013 0750   PLT 54* 08/23/2013 0750   MCV 75.6* 08/23/2013 0750   MCH 22.8* 08/23/2013 0750   MCHC 30.2 08/23/2013 0750   RDW 21.8* 08/23/2013 0750   LYMPHSABS 0.7 08/21/2013 0402   MONOABS 1.1* 08/21/2013 0402   EOSABS 0.1 08/21/2013 0402   BASOSABS 0.0 08/21/2013 0402   Assessment: 1. Upper GI bleeding, resolved, presumed variceal, post endoscopic esophageal variceal band ligation.  2. Anemia. Suspect initially acute blood loss. Slow downtrend, but no overt continued bleeding.  3. Altered mental status, extubated today. 4. 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  5. Elevated LFTs, uptrending since admission. Suspect underlying elevation due to cirrhosis, and likely contribution acutely from his admission hypovolemia from GI bleeding ("shock liver").  Plan:  1.  Clear liquids today. 2.  Pantoprazole. 3.  If tolerates clear liquids today, would stop lactulose enemas and start rifaximin 550 mg po bid. 4.  Follow LFTs; consider abdominal ultrasound if continued uptrend. 5.  Will follow.   [name] [name] 08/23/2013, 4:03 PM

## 2013-08-23 NOTE — Progress Notes (Signed)
PULMONARY / CRITICAL CARE MEDICINE, progress note   Name: [name] [name] MRN: [id number] DOB: 1960/07/11    ADMISSION DATE:  08/18/2013 CONSULTATION DATE:  08/18/13  REFERRING MD :    CHIEF COMPLAINT:  Upper GIB  BRIEF PATIENT DESCRIPTION:   53 year old obese male with [place] documented Cirrhosis NOS (no ascites feb 2015), Hep C, small esophageal varices and diverticulsos (per chart bx), admitted for upper GIB, hepatic encephalopathy and severe lactic acidosis. Now he is s/p banding of varice. Transfused with 7 units of blood. Lactic acid level normalized on 4/5.   Of note, admitted 07/06/13 to 07/09/13: with dark maroon stool. UGI scope did not show source of bleed on 07/07/13 and there was no other evidence of portal htn other than small varices. Due to 2011 - colonoscopy diverticulosis it was felt he migh have had colonic diverticualr bleed.   SIGNIFICANT EVENTS / STUDIES:   4/3 - admit, intubated, central line, aline  4/3- transfused 3 U of blood and 1 Unit of FFP 4/4- transfused 3 U of blood 4/3- CXR: No active disease. 4/3- EGD w/ band ligation of varices 4/4- X-ray abdo no evidence of free air. 1 more U prbc 4/5- transfused 1 U of blood 4/8- follows commands  LINES / TUBES: 08/18/13- CVL>> 08/18/13-aline>>4/6 4/3 ETT>>>  CULTURES: 4/3- Urine culture negative 4/3- Blood culture X 2, 1/2 positive STAPHYLOCOCCUS SPECIES (COAGULASE NEGATIVE) 4/8- Blood culture>>   ANTIBIOTICS: 4/3- cefriaxone>>4/3, 4/5 >> (SBP prophylaxis in setting of GIB) >>plan stop 9th 4/3- cipro>>4/5 4/4- Vancomycine>>4/3  SUBJECTIVE:   Subjective:  4/8: patient wakes up in AM. Seems feeling comfortable. No new bloody stool per nurse   VITAL SIGNS: Temp:  [98.6 F (37 C)-100.8 F (38.2 C)] 100.4 F (38 C) (04/08 0500) Pulse Rate:  [78-107] 85 (04/08 0500) Resp:  [13-24] 15 (04/08 0500) BP: (116-169)/(59-88) 146/70 mmHg (04/08 0500) SpO2:  [97 %-100 %] 98 % (04/08 0500) FiO2 (%):  [40 %] 40 % (04/08  0400) Weight:  [313 lb 0.9 oz (142 kg)] 313 lb 0.9 oz (142 kg) (04/08 0400) HEMODYNAMICS: CVP:  [4 mmHg-5 mmHg] 5 mmHg VENTILATOR SETTINGS: Vent Mode:  [-] PSV FiO2 (%):  [40 %] 40 % PEEP:  [5 cmH20] 5 cmH20 Pressure Support:  [5 cmH20-8 cmH20] 8 cmH20 INTAKE / OUTPUT: Intake/Output     04/07 0701 - 04/08 0700   I.V. (mL/kg) 2026.8 (14.3)   Other 300   IV Piggyback 50   Total Intake(mL/kg) 2376.8 (16.7)   Urine (mL/kg/hr) 2030 (0.6)   Total Output 2030   Net +346.8         PHYSICAL EXAMINATION: General: Looks critically ill, intubated. More alert in AM Neuro: reponds to stimulii HEENT: Neck supple, PEERL +  Cardiovascular: Tachycardic  Lungs: on vent +. Coarse breathing sound Abdomen: Obese, soft, Bowel sound present  Musculoskeletal: No cyanosis, no clubbing, no edema  Skin: intact  LABS:   PULMONARY  Recent Labs Lab 08/19/13 0105 08/19/13 0411 08/19/13 1057 08/19/13 2025 08/21/13 1825  PHART 7.204* 7.372 7.444 7.445 7.485*  PCO2ART 22.8* 21.7* 22.4* 25.5* 25.6*  PO2ART 185.0* 166.0* 184.0* 155.0* 148.0*  HCO3 9.0* 12.6* 15.2* 17.1* 19.3*  TCO2 10 13 16  17.8 20  O2SAT 99.0 100.0 100.0 99.5 99.0    CBC  Recent Labs Lab 08/21/13 1800 08/22/13 0415 08/22/13 1800  HGB 7.5* 7.0* 7.4*  HCT 23.6* 22.6* 24.0*  WBC 4.5 3.4* 4.1  PLT 59* 53* 62*    COAGULATION  Recent  Labs Lab 08/18/13 1815 08/18/13 1847 08/20/13 0425  INR 1.90* 1.71* 1.94*    CARDIAC    Recent Labs Lab 08/18/13 1847 08/19/13 0240 08/19/13 0500  TROPONINI <0.30 <0.30 <0.30    Recent Labs Lab 08/18/13 1847  PROBNP 51.7     CHEMISTRY  Recent Labs Lab 08/19/13 0500 08/20/13 0425 08/20/13 1537 08/21/13 0402 08/22/13 0415 08/22/13 1800 08/23/13 0500  NA 145 146 147 146 153* 158* 158*  K 5.2 3.8 3.9 3.7 3.6* 3.4* 3.4*  CL 109 114* 116* 117* 124* 128* 127*  CO2 11* 17* 17* 16* 18* 19 19  GLUCOSE 166* 150* 157* 147* 153* 164* 152*  BUN 64* 89* 91* 83* 65* 51*  43*  CREATININE 1.48* 1.92* 1.74* 1.52* 1.33 1.21 1.25  CALCIUM 8.0* 7.6* 7.5* 7.3* 7.7* 7.7* 7.8*  MG 2.0 2.3  --  2.8* 3.1*  --  3.0*  PHOS 3.4 3.2  --  3.6 3.3  --  2.9   Estimated Creatinine Clearance: 109.3 ml/min (by C-G formula based on Cr of 1.25).   LIVER  Recent Labs Lab 08/18/13 1540 08/18/13 1815 08/18/13 1847 08/20/13 0425 08/21/13 0402 08/22/13 0415  AST 102*  --  116*  --  793* 426*  ALT 48  --  53  --  538* 441*  ALKPHOS 51  --  51  --  44 48  BILITOT 1.1  --  1.3*  --  3.0* 4.9*  PROT 5.8*  --  6.2  --  4.9* 5.0*  ALBUMIN 2.8*  --  3.0*  --  2.3* 2.3*  INR  --  1.90* 1.71* 1.94*  --   --      INFECTIOUS  Recent Labs Lab 08/18/13 1534 08/18/13 1847 08/19/13 0240 08/20/13 0425  LATICACIDVEN 16.89* 18.2* 12.7* 1.8  PROCALCITON  --  0.23  --   --      ENDOCRINE CBG (last 3)   Recent Labs  08/21/13 0913 08/21/13 1603 08/21/13 1847  GLUCAP 144* 147* 122*    IMAGING x48h  Dg Chest Port 1 View  08/22/2013   CLINICAL DATA:  Hepatic encephalopathy  EXAM: PORTABLE CHEST - 1 VIEW  COMPARISON:  08/20/2013  FINDINGS: Endotracheal tube in good position. Right jugular catheter tip in the SVC, unchanged.  Mild left lower lobe atelectasis is unchanged. Right lung is clear. No edema or effusion.  IMPRESSION: Mild left lower lobe atelectasis, unchanged.  No new findings.   Electronically Signed   By: Marlan Palauharles  Clark M.D.   On: 08/22/2013 08:08     ASSESSMENT / PLAN:  PULMONARY A:  Intubated for UGI scpe. Acute resp failure.   P:   -wean CPAP 5 PS 5,  more awake, will d/w wife, cosnider extubation, he is DNI -we have maximized efforts for success -allow pos balance -assess cough, rsbi  CARDIOVASCULAR A: Hemorrhagic shock @ admit 08/18/2013. resolved   - off pressors 08/20/13  P:   Tele Even balance goals No pressors wished  RENAL A:   - Severe lactic acidosis at at admit - resolved - currently ATN, improved - HypoK -HperNa  P:   Avoid  nacl Monitor BMP in pm  Continue d5w increase 150 cc/hr  GASTROINTESTINAL A:   - Cirrhosis NOS Child Class. Hepatic encephalopathy - Admited with Acute variceal bleed and s/p banding 08/18/13. No NG/OG per GI - Shock liver improved P:    Lactulose enema If extubated, would want to start diet, will  Need q12h chem with  risk refeeding  Currently on IV ceftriaxone for sbp prophylaxis, stop date in place slp will be needed  HEMATOLOGIC A:  Severe blood loss anemia.  coagulapathy   - Hgb 7.4 last night at 6:00 PM, No new bloody bowel movement. GI aware and monitoring  P:  prbc for hgb > 7gm%, wife wishes only 1 more unit regardless of clinical status Cbc in am  scd Repeat vit K , coags in am   INFECTIOUS A:  No evidence of sepsis. Procalcitonin normal.   -Blood Culture 4/3: 1/2 positive for Staph (coagulase negative), likely contamination. - Repeat Blood culture pending P: Monitor Ceftriaxone for SBP prophylaxis, ad stop date 7 days  ENDOCRINE A: Cortisol level 68. CBG 152 in AM P:   Monitor glu with addition d5w and higher rate   NEUROLOGIC A:  Acute enceph likely liver source.  - Improving, More alter today  P:   lactulose enema to oral when extubated Propofol as needed with wua Will need pt  Lorretta Harp, MD PGY3, Internal Medicine Teaching Service Pager: [phone]  The patient is critically ill with multiple organ systems failure and requires high complexity decision making for assessment and support, frequent evaluation and titration of therapies, application of advanced monitoring technologies and extensive interpretation of multiple databases.   Critical Care Time devoted to patient care services described in this note is  30  Minutes.  [name]. Tyson Alias, MD, FACP Pgr: [phone] [facility]

## 2013-08-23 NOTE — Progress Notes (Signed)
Pt placed on SBT of CP/PS 5/5 and is tolerating well at this time. No complications noted. RT will monitor.

## 2013-08-23 NOTE — Progress Notes (Signed)
RT responding to ventilator alarm and found the pt sitting up on the side of the bed, disconnected from the vent, and the ET tube pulled out a significant amount. Dr. Bary RichardFienstein entered the room seconds later and and ordered to finish extubating the pt. Pt extubated to 4L [place] and is tolerating well at this time with no stridor or other complications noted. RT will monitor.

## 2013-08-23 NOTE — Progress Notes (Signed)
  08/23/2013 10:20 PM  Pt transfer to 5N from MICU. Rectal tube/ flexiseal remains in patient per RN request Mary SwazilandJordan RN.  MD to reassess need for flexseal in am. Pt receiving PR lactulose Enema with large volumes of water stool.   Carlyon ProwsShakiera Mckayla Mulcahey RN [phone]

## 2013-08-23 NOTE — Procedures (Signed)
Extubation Procedure Note  Patient Details:   Name: Marva PandaDavid Ante DOB: 1960/07/15 MRN: [id number]   Airway Documentation:     Evaluation  O2 sats: stable throughout Complications: No apparent complications Patient did tolerate procedure well. Bilateral Breath Sounds: Rhonchi Suctioning: Airway Yes Pt self-extubated himself. Please see progress notes for further details. Pt tolerating well on 4L [place] with no complications noted at this time. RT will monitor.   Cloyd StagersGina A Yulisa Chirico 08/23/2013, 10:34 AM

## 2013-08-24 ENCOUNTER — Inpatient Hospital Stay (HOSPITAL_COMMUNITY): Payer: Medicare Other

## 2013-08-24 LAB — COMPREHENSIVE METABOLIC PANEL
ALBUMIN: 2.3 g/dL — AB (ref 3.5–5.2)
ALK PHOS: 53 U/L (ref 39–117)
ALT: 208 U/L — ABNORMAL HIGH (ref 0–53)
AST: 109 U/L — ABNORMAL HIGH (ref 0–37)
BILIRUBIN TOTAL: 5.1 mg/dL — AB (ref 0.3–1.2)
BUN: 30 mg/dL — AB (ref 6–23)
CO2: 18 meq/L — AB (ref 19–32)
CREATININE: 1.1 mg/dL (ref 0.50–1.35)
Calcium: 7.4 mg/dL — ABNORMAL LOW (ref 8.4–10.5)
Chloride: 111 mEq/L (ref 96–112)
GFR calc Af Amer: 88 mL/min — ABNORMAL LOW (ref >90)
GFR, EST NON AFRICAN AMERICAN: 76 mL/min — AB (ref >90)
Glucose, Bld: 131 mg/dL — ABNORMAL HIGH (ref 70–99)
POTASSIUM: 3.8 meq/L (ref 3.7–5.3)
Sodium: 142 mEq/L (ref 137–147)
Total Protein: 5.1 g/dL — ABNORMAL LOW (ref 6.0–8.3)

## 2013-08-24 LAB — GLUCOSE, CAPILLARY
Glucose-Capillary: 97 mg/dL (ref 70–99)
Glucose-Capillary: 132 mg/dL — ABNORMAL HIGH (ref 70–99)

## 2013-08-24 LAB — CULTURE, BLOOD (ROUTINE X 2): CULTURE: NO GROWTH

## 2013-08-24 LAB — PROTIME-INR
INR: 1.34 (ref 0.00–1.49)
Prothrombin Time: 16.3 seconds — ABNORMAL HIGH (ref 11.6–15.2)

## 2013-08-24 LAB — CBC
HEMATOCRIT: 21.7 % — AB (ref 39.0–52.0)
Hemoglobin: 6.6 g/dL — CL (ref 13.0–17.0)
MCH: 22.8 pg — ABNORMAL LOW (ref 26.0–34.0)
MCHC: 30.4 g/dL (ref 30.0–36.0)
MCV: 75.1 fL — ABNORMAL LOW (ref 78.0–100.0)
PLATELETS: 49 10*3/uL — AB (ref 150–400)
RBC: 2.89 MIL/uL — ABNORMAL LOW (ref 4.22–5.81)
RDW: 22.3 % — AB (ref 11.5–15.5)
WBC: 4.1 10*3/uL (ref 4.0–10.5)

## 2013-08-24 LAB — APTT: aPTT: 30 seconds (ref 24–37)

## 2013-08-24 LAB — MAGNESIUM: Magnesium: 2.6 mg/dL — ABNORMAL HIGH (ref 1.5–2.5)

## 2013-08-24 LAB — PHOSPHORUS: PHOSPHORUS: 2.9 mg/dL (ref 2.3–4.6)

## 2013-08-24 MED ORDER — SODIUM CHLORIDE 0.9 % IJ SOLN
10.0000 mL | Freq: Two times a day (BID) | INTRAMUSCULAR | Status: DC
  Administered 2013-08-26 – 2013-08-27 (×2): 10 mL

## 2013-08-24 MED ORDER — RIFAXIMIN 550 MG PO TABS
550.0000 mg | ORAL_TABLET | Freq: Two times a day (BID) | ORAL | Status: DC
  Administered 2013-08-24 – 2013-08-27 (×5): 550 mg via ORAL
  Filled 2013-08-24 (×11): qty 1

## 2013-08-24 MED ORDER — SODIUM CHLORIDE 0.9 % IJ SOLN
10.0000 mL | INTRAMUSCULAR | Status: DC | PRN
  Administered 2013-08-24: 10 mL
  Administered 2013-08-24: 20 mL
  Administered 2013-08-24 – 2013-08-25 (×4): 10 mL
  Administered 2013-08-26 – 2013-08-27 (×3): 20 mL
  Administered 2013-08-28 (×2): 10 mL
  Administered 2013-08-29: 20 mL
  Administered 2013-08-29 (×2): 10 mL
  Administered 2013-08-30: 20 mL

## 2013-08-24 MED ORDER — MORPHINE SULFATE 2 MG/ML IJ SOLN
2.0000 mg | Freq: Once | INTRAMUSCULAR | Status: AC
  Administered 2013-08-24: 2 mg via INTRAVENOUS
  Filled 2013-08-24: qty 1

## 2013-08-24 MED ORDER — ENSURE COMPLETE PO LIQD: 237.0000 mL | Freq: Two times a day (BID) | ORAL | Status: DC

## 2013-08-24 MED ORDER — LACTULOSE 10 GM/15ML PO SOLN
30.0000 g | Freq: Three times a day (TID) | ORAL | Status: DC
  Administered 2013-08-24 (×3): 30 g via ORAL
  Filled 2013-08-24 (×17): qty 45

## 2013-08-24 MED ORDER — MORPHINE SULFATE 2 MG/ML IJ SOLN
2.0000 mg | INTRAMUSCULAR | Status: DC | PRN
Start: 2013-08-24 — End: 2013-08-25
  Administered 2013-08-24: 4 mg via INTRAVENOUS
  Administered 2013-08-24 (×2): 2 mg via INTRAVENOUS
  Administered 2013-08-25 (×3): 4 mg via INTRAVENOUS
  Filled 2013-08-24: qty 2
  Filled 2013-08-24: qty 1
  Filled 2013-08-24: qty 2
  Filled 2013-08-24: qty 1
  Filled 2013-08-24 (×2): qty 2

## 2013-08-24 MED ORDER — ENSURE COMPLETE PO LIQD
237.0000 mL | Freq: Three times a day (TID) | ORAL | Status: DC
  Administered 2013-08-24 – 2013-08-28 (×4): 237 mL via ORAL

## 2013-08-24 NOTE — Clinical Social Work Note (Signed)
Pt transferred to 5N from 21M.  21M CSW completed FL2.  PAS[facility] is currently under review (needs f/u).  SNF search has been initiated.  21M gave report to 5N CSW.  21M CSW signing off.  Vickii PennaGina Rheannon Cerney, LCSWA [phone]  Clinical Social Work

## 2013-08-24 NOTE — Progress Notes (Signed)
Subjective: Pt c/o SOB Awake alert- oriented x1 Tolerate liquid diet ok  Objective: Vital signs in last 24 hours: Temp:  [97.8 F (36.6 C)-100.1 F (37.8 C)] 98.5 F (36.9 C) (04/09 0630) Pulse Rate:  [77-100] 95 (04/09 0630) Resp:  [15-25] 18 (04/09 0630) BP: (118-141)/(56-80) 131/56 mmHg (04/09 0630) SpO2:  [91 %-100 %] 99 % (04/09 0630) FiO2 (%):  [40 %] 40 % (04/08 1000) Weight change:  Last BM Date: 08/24/13  Intake/Output from previous day: 04/08 0701 - 04/09 0700 In: 2208.3 [I.V.:1958.3; IV Piggyback:250] Out: 1325 [Urine:825; Stool:500] Intake/Output this shift:    General appearance: alert Resp: clear to auscultation bilaterally Cardio: regular rate and rhythm GI: soft with BS; ascites  Lab Results:  Recent Labs  08/22/13 1800 08/23/13 0750  WBC 4.1 3.6*  HGB 7.4* 7.1*  HCT 24.0* 23.5*  PLT 62* 54*   BMET  Recent Labs  08/23/13 0500 08/23/13 1700  NA 158* 152*  K 3.4* 3.5*  CL 127* 122*  CO2 19 18*  GLUCOSE 152* 145*  BUN 43* 35*  CREATININE 1.25 1.07  CALCIUM 7.8* 7.5*    Studies/Results: No results found.  Medications: I have reviewed the patient's current medications.  Assessment/Plan: Repiratory failure with severe lactic acidosis/ hepatic encphalopathy - s/p intubation; extubated 08/23/13- Chest xray-  Atelectasis- incentive spirometry- oob chair UGI bleed- variceal bleed- s/p Endo- per GI- receive 7 unit of blood. End stage Cirrohsis- Hep C/ per GI Depression - no med Pancytopenia due to cirrohsis D/c rectal tube Pallative care consult  Advance diet  LOS: 6 days   [name] [name] 08/24/2013, 7:35 AM

## 2013-08-24 NOTE — Clinical Social Work Psychosocial (Signed)
Clinical Social Work Department BRIEF PSYCHOSOCIAL ASSESSMENT 08/22/2013  Patient:  [name] [name]     Account Number:  [id number]     Admit date:  08/18/2013  Clinical Social Worker:  Read DriversINGLE,REGINA, LCSWA  Date/Time:  08/22/2013 08:43 AM  Referred by:  Care Management  Date Referred:  08/22/2013 Referred for  SNF Placement  Crisis Intervention   Other Referral:   pt from Group Home   Interview type:  Other - See comment Other interview type:   spoke with [name] [name] BraunKaren, pt spouse    PSYCHOSOCIAL DATA Living Status:  FACILITY Admitted from facility:   Level of care:  Group Home Primary support name:  [name] [name] Primary support relationship to patient:  SPOUSE Degree of support available:   fair to minimal    CURRENT CONCERNS Current Concerns  Post-Acute Placement   Other Concerns:   Long term care- if needed, how it would be paid for.  Pt wife does not beleive they would be eligible for Medicaid due to the fact that they are legally still married and she states her income would be a barrier to him qualifying.    SOCIAL WORK ASSESSMENT / PLAN Pt was not alert and oriented.  Pt on vent and sedated. CSW spoke with wife, [name] [name] BraunKaren, who states that she and the pt are no longer together, though she remains his caregiver. The pt and wife DO NOT have legal separation documents.  Pt has been living in transitional housing/group home for SA recovery during which time, his medical conditions have been reoccuring.  Pt has numerous health issues that is directly linked to SA and ETOH abuse.    Per [name] [name] BraunKaren, pt has attempted suicide at least 6 times in the past.  The most recent a little over a year ago.  These attempts have been OD attempts and not seeking medical attention with GI bleeds.  Pt wife states that pt would not want to live on mechanical ventilation.    [name] [name] BraunKaren has contacted pt brothers and has shared the password with them so they may obtain medical information.  [name] [name] BraunKaren remains the main decision maker  for the pt.  [name] [name] BraunKaren states that the brothers are estranged and have not seen the patient in the past 4 years.  [name] [name] BraunKaren goes on to state that during the past 4 years, she has reached out to pt's family for help during this very difficult time of pt's SA and ETOH abuse.  During this time, per wife, pt used most of the family's finances to buy drugs which has left them in bankruptcy.    Wife is frustrated in dealing with the "roller coaster ride" of pt's medical condition.  Wife states that she would like to see the pt without pain for once.  CSW provided encouragement and comfort to wife.  [name] [name] BraunKaren states that, to her knowledge while in the group home, the pt has remained sober.    Pt will likely need SNF upon dc for strengthening prior to being dc back to group home.   Assessment/plan status:  Psychosocial Support/Ongoing Assessment of Needs Other assessment/ plan:   FL2  PAS[facility]   Information/referral to community resources:   SNF  group home    PATIENT'S/FAMILY'S RESPONSE TO PLAN OF CARE: pt wife is appreciative of CSW assistance.       [name] [name] [name] [name], LCSWA [phone]  Clinical Social Work

## 2013-08-24 NOTE — Progress Notes (Signed)
Subjective: No further bleeding. No abdominal pain.  Objective: Vital signs in last 24 hours: Temp:  [97.8 F (36.6 C)-98.5 F (36.9 C)] 98.5 F (36.9 C) (04/09 0630) Pulse Rate:  [77-95] 95 (04/09 0630) Resp:  [16-20] 18 (04/09 0630) BP: (118-141)/(56-80) 131/56 mmHg (04/09 0630) SpO2:  [98 %-100 %] 99 % (04/09 0630) Weight change:  Last BM Date: 08/24/13  PE: GEN:  Awake, but somnolent.  Has some mental slowing, but can answer questions appropriately. ABD:  Moderate distention, soft  Lab Results: CBC    Component Value Date/Time   WBC 4.1 08/24/2013 0707   RBC 2.89* 08/24/2013 0707   HGB 6.6* 08/24/2013 0707   HCT 21.7* 08/24/2013 0707   PLT 49* 08/24/2013 0707   MCV 75.1* 08/24/2013 0707   MCH 22.8* 08/24/2013 0707   MCHC 30.4 08/24/2013 0707   RDW 22.3* 08/24/2013 0707   LYMPHSABS 0.7 08/21/2013 0402   MONOABS 1.1* 08/21/2013 0402   EOSABS 0.1 08/21/2013 0402   BASOSABS 0.0 08/21/2013 0402   CMP     Component Value Date/Time   NA 142 08/24/2013 0707   K 3.8 08/24/2013 0707   CL 111 08/24/2013 0707   CO2 18* 08/24/2013 0707   GLUCOSE 131* 08/24/2013 0707   BUN 30* 08/24/2013 0707   CREATININE 1.10 08/24/2013 0707   CALCIUM 7.4* 08/24/2013 0707   PROT 5.1* 08/24/2013 0707   ALBUMIN 2.3* 08/24/2013 0707   AST 109* 08/24/2013 0707   ALT 208* 08/24/2013 0707   ALKPHOS 53 08/24/2013 0707   BILITOT 5.1* 08/24/2013 0707   GFRNONAA 76* 08/24/2013 0707   GFRAA 88* 08/24/2013 0707   Assessment:  1. Upper GI bleeding, resolved, presumed variceal, post endoscopic esophageal variceal band ligation.  2. Anemia. Suspect initially acute blood loss. Slow downtrend, but no overt continued bleeding.  3. Altered mental status, slowly improving.  Extubated. 4. 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  5. Elevated LFTs, now starting to downtrend. Suspect underlying elevation due to cirrhosis, and likely contribution acutely from his admission hypovolemia from GI bleeding ("shock liver").   Plan:  1.  Continue  lactulose and rifaximin for hepatic encephalopathy. 2.  Advance diet as tolerated.  Continue pantoprazole. 3.  If continues to downtrend Hgb, would consider blood transfusion.  May need to consider repeat endoscopy ultimately, but given recent esophageal bands and lack of overt GI tract bleeding, would hold off for the present time. 4.  Will follow.   [name] ModenaWilliam Gerri [name] 08/24/2013, 3:58 PM

## 2013-08-24 NOTE — Evaluation (Signed)
Physical Therapy Evaluation Patient Details Name: [name] [name] MRN: [id number] DOB: Dec 10, 1960 Today's Date: 08/24/2013   History of Present Illness  53 year old obese male with [place] documented Cirrhosis NOS (no ascites feb 2015), Hep C, small esophageal varices and diverticulsos (per chart bx), admitted for upper GIB, hepatic encephalopathy and severe lactic acidosis. Now he is s/p banding of varice.   Clinical Impression  Pt with deficits in strength, activity tolerance and balance.  Will benefit from skilled PT services to address deficits and increase functional independence.  Depending on progress pt may require SNF vs 24 hour assistance at home.    Follow Up Recommendations SNF;Supervision/Assistance - 24 hour;Home health PT (depending on progress SNF vs home with HHPT)    Equipment Recommendations  Rolling walker with 5" wheels    Recommendations for Other Services       Precautions / Restrictions Precautions Precautions: Fall Restrictions Weight Bearing Restrictions: No      Mobility  Bed Mobility Overal bed mobility: Needs Assistance Bed Mobility: Rolling;Supine to Sit Rolling: Mod assist   Supine to sit: Mod assist     General bed mobility comments: assist for trunk and min A for each LE. pt initiates movement well for supine to sit, pt requires cuing for LE and UE placement for rolling  Transfers Overall transfer level: Needs assistance Equipment used: Rolling walker (2 wheeled) Transfers: Sit to/from [employer] Sit to Stand: Max assist Stand pivot transfers: Mod assist       General transfer comment: initially pt wants to attempt to standing without AD, pt requires max A and is unable to fully extend LEs.  With RW pt able to stand with mod A and pivot few steps into recliner. pt with forward flexed posture  Ambulation/Gait                Stairs            Wheelchair Mobility    Modified Rankin (Stroke Patients Only)        Balance  Pt able to sit edge of bed with close supervision x 8 minutes                                           Pertinent Vitals/Pain No c/o pain. Treatment performed on 2L O2, HR 99, spO2 99% after during mobility    Home Living Family/patient expects to be discharged to:: Other (Comment) (recovery house)                      Prior Function Level of Independence: Independent               Hand Dominance        Extremity/Trunk Assessment   Upper Extremity Assessment: Generalized weakness           Lower Extremity Assessment: Generalized weakness (grossly 3-/5)      Cervical / Trunk Assessment: Kyphotic;Lordotic  Communication   Communication: No difficulties  Cognition Arousal/Alertness: Lethargic Behavior During Therapy: Flat affect Overall Cognitive Status: No family/caregiver present to determine baseline cognitive functioning                      General Comments      Exercises        Assessment/Plan    PT Assessment Patient needs continued PT services  PT  Diagnosis Difficulty walking;Generalized weakness   PT Problem List Decreased strength;Decreased mobility;Decreased activity tolerance;Decreased balance;Decreased knowledge of use of DME;Cardiopulmonary status limiting activity  PT Treatment Interventions DME instruction;Therapeutic exercise;Wheelchair mobility training;Balance training;Gait training;Stair training;Neuromuscular re-education;Modalities;Cognitive remediation;Functional mobility training;Therapeutic activities;Patient/family education   PT Goals (Current goals can be found in the Care Plan section) Acute Rehab PT Goals Patient Stated Goal: none stated PT Goal Formulation: With patient Time For Goal Achievement: 09/07/13 Potential to Achieve Goals: Good    Frequency Min 3X/week   Barriers to discharge Decreased caregiver support lives at recovery house    Co-evaluation                End of Session Equipment Utilized During Treatment: Gait belt;Oxygen Activity Tolerance: Patient tolerated treatment well Patient left: in chair;with nursing/sitter in room;with call bell/phone within reach Nurse Communication: Mobility status         Time: [id number] PT Time Calculation (min): 22 min   Charges:   PT Evaluation $Initial PT Evaluation Tier I: 1 Procedure PT Treatments $Therapeutic Activity: 8-22 mins   PT G Codes:          Leone BrandKaren K Faydra Korman 08/24/2013, 9:37 AM

## 2013-08-24 NOTE — Progress Notes (Signed)
NUTRITION FOLLOW-UP  INTERVENTION: Ensure Complete po TID, each supplement provides 350 kcal and 13 grams of protein  NUTRITION DIAGNOSIS: Inadequate oral intake related to inability to eat as evidenced by NPO status; progressing  Goal: Pt to meet >/= 90% of their estimated nutrition needs   Monitor:  Nutrition support initiation, respiratory status, weight, labs, I/O's  ASSESSMENT: 53 y.o. male with history of Hep C and alcohol cirrhosis who presened with a history of GI bleed with severe anemia and report of melena. Patient called EMS because he felt sick.   Patient s/p procedure 4/3: EGD with BAND LIGATIONS OF VARICES  Pt extubated 4/8. Pt's diet advanced this am to full liquids. Pt ate approx 25% at lunch. Pt would like to have ensure. Palliative care consult pending.   Height: Ht Readings from Last 1 Encounters:  08/18/13 6' 5.17" (1.96 m)    Weight: Wt Readings from Last 1 Encounters:  08/23/13 313 lb 0.9 oz (142 kg)  Admission weight 295 lb (134 kg)  BMI:  Body mass index is 36.96 kg/(m^2).  Estimated Nutritional Needs: Kcal: 2500-2700 Protein: 125-150 grams Fluid: >1.2 L/day  Skin: Intact  Diet Order: Full Liquid  EDUCATION NEEDS: -No education needs identified at this time   Intake/Output Summary (Last 24 hours) at 08/24/13 1448 Last data filed at 08/24/13 0742  Gross per 24 hour  Intake   3549 ml  Output    850 ml  Net   2699 ml   Labs:   Recent Labs Lab 08/22/13 0415  08/23/13 0500 08/23/13 1700 08/24/13 0707  NA 153*  < > 158* 152* 142  K 3.6*  < > 3.4* 3.5* 3.8  CL 124*  < > 127* 122* 111  CO2 18*  < > 19 18* 18*  BUN 65*  < > 43* 35* 30*  CREATININE 1.33  < > 1.25 1.07 1.10  CALCIUM 7.7*  < > 7.8* 7.5* 7.4*  MG 3.1*  --  3.0*  --  2.6*  PHOS 3.3  --  2.9  --  2.9  GLUCOSE 153*  < > 152* 145* 131*  < > = values in this interval not displayed.  CBG (last 3)   Recent Labs  08/23/13 1937 08/23/13 2349 08/24/13 0346  GLUCAP  154* 97 132*    Scheduled Meds: . antiseptic oral rinse  15 mL Mouth Rinse BID  . lactulose  30 g Oral TID  . pantoprazole (PROTONIX) IV  40 mg Intravenous Q12H  . rifaximin  550 mg Oral BID  . sodium chloride  10-40 mL Intracatheter Q12H    Continuous Infusions:    Kendell BaneHeather Breaker Springer RD, LDN, CNSC [phone] Pager [phone] After Hours Pager

## 2013-08-24 NOTE — Progress Notes (Signed)
Palliative will meet with [name] [name] and [name] tomorrow 4/10 at 0900 am to discuss goals of care and options.   [name], NP Palliative Medicine Team Pager # [phone] (M-F 8a-5p) Team Phone # [phone] (Nights/Weekends)

## 2013-08-24 NOTE — Progress Notes (Signed)
Critical Hgb 6.6 called from lab. Notified Dr Donette LarryHusain of critical lab. Ordered no blood at this time as we are taking the route of palliative care.

## 2013-08-24 NOTE — Clinical Social Work Placement (Signed)
Clinical Social Work Department CLINICAL SOCIAL WORK PLACEMENT NOTE 08/24/2013  Patient:  [name] [name],[name] [name]  Account Number:  [id number] Admit date:  08/18/2013  Clinical Social Worker:  Read DriversEGINA Millie Shorb, LCSWA  Date/time:  08/24/2013 09:52 AM  Clinical Social Work is seeking post-discharge placement for this patient at the following level of care:   SKILLED NURSING   (*CSW will update this form in Epic as items are completed)   08/24/2013  Patient/family provided with [facility] Department of Clinical Social Work's list of facilities offering this level of care within the geographic area requested by the patient (or if unable, by the patient's family).  08/24/2013  Patient/family informed of their freedom to choose among providers that offer the needed level of care, that participate in Medicare, Medicaid or managed care program needed by the patient, have an available bed and are willing to accept the patient.  08/24/2013  Patient/family informed of [facility]' ownership interest in [facility], as well as of the fact that they are under no obligation to receive care at this facility.  PASARR submitted to EDS on 08/24/2013 PASARR number received from EDS on currently under review (60M CSW gave report to 5N CSW)  FL2 transmitted to all facilities in geographic area requested by pt/family on  08/24/2013 FL2 transmitted to all facilities within larger geographic area on   Patient informed that his/her managed care company has contracts with or will negotiate with  certain facilities, including the following:     Patient/family informed of bed offers received:   Patient chooses bed at  Physician recommends and patient chooses bed at    Patient to be transferred to  on   Patient to be transferred to facility by   The following physician request were entered in Epic:   Additional Comments: PASARR is currently under review.  60M CSW updated/reported to 5N CSW.

## 2013-08-25 DIAGNOSIS — Z515 Encounter for palliative care: Secondary | ICD-10-CM

## 2013-08-25 DIAGNOSIS — F1920 Other psychoactive substance dependence, uncomplicated: Secondary | ICD-10-CM

## 2013-08-25 LAB — COMPREHENSIVE METABOLIC PANEL
ALT: 169 U/L — ABNORMAL HIGH (ref 0–53)
AST: 82 U/L — AB (ref 0–37)
Albumin: 2.3 g/dL — ABNORMAL LOW (ref 3.5–5.2)
Alkaline Phosphatase: 71 U/L (ref 39–117)
BUN: 27 mg/dL — ABNORMAL HIGH (ref 6–23)
CHLORIDE: 106 meq/L (ref 96–112)
CO2: 18 mEq/L — ABNORMAL LOW (ref 19–32)
Calcium: 7.6 mg/dL — ABNORMAL LOW (ref 8.4–10.5)
Creatinine, Ser: 1.03 mg/dL (ref 0.50–1.35)
GFR, EST NON AFRICAN AMERICAN: 82 mL/min — AB (ref >90)
GLUCOSE: 154 mg/dL — AB (ref 70–99)
Potassium: 3.5 mEq/L — ABNORMAL LOW (ref 3.7–5.3)
Sodium: 138 mEq/L (ref 137–147)
Total Bilirubin: 4.5 mg/dL — ABNORMAL HIGH (ref 0.3–1.2)
Total Protein: 5.3 g/dL — ABNORMAL LOW (ref 6.0–8.3)

## 2013-08-25 LAB — CULTURE, BLOOD (ROUTINE X 2)
CULTURE: NO GROWTH
Culture: NO GROWTH

## 2013-08-25 LAB — CBC
HEMATOCRIT: 22.6 % — AB (ref 39.0–52.0)
Hemoglobin: 7 g/dL — ABNORMAL LOW (ref 13.0–17.0)
MCH: 23 pg — AB (ref 26.0–34.0)
MCHC: 31 g/dL (ref 30.0–36.0)
MCV: 74.1 fL — ABNORMAL LOW (ref 78.0–100.0)
Platelets: 66 10*3/uL — ABNORMAL LOW (ref 150–400)
RBC: 3.05 MIL/uL — ABNORMAL LOW (ref 4.22–5.81)
RDW: 22.9 % — ABNORMAL HIGH (ref 11.5–15.5)
WBC: 5.9 10*3/uL (ref 4.0–10.5)

## 2013-08-25 MED ORDER — HYDROMORPHONE HCL PF 1 MG/ML IJ SOLN
1.0000 mg | INTRAMUSCULAR | Status: DC | PRN
  Administered 2013-08-25 – 2013-08-29 (×18): 1 mg via INTRAVENOUS
  Filled 2013-08-25 (×18): qty 1

## 2013-08-25 MED ORDER — ALPRAZOLAM 0.5 MG PO TABS
2.0000 mg | ORAL_TABLET | Freq: Three times a day (TID) | ORAL | Status: DC | PRN
  Administered 2013-08-25 – 2013-08-27 (×5): 2 mg via ORAL
  Filled 2013-08-25 (×6): qty 4

## 2013-08-25 MED ORDER — PANTOPRAZOLE SODIUM 40 MG PO TBEC
40.0000 mg | DELAYED_RELEASE_TABLET | Freq: Two times a day (BID) | ORAL | Status: DC
  Administered 2013-08-25 – 2013-08-27 (×3): 40 mg via ORAL
  Filled 2013-08-25 (×3): qty 1

## 2013-08-25 NOTE — Progress Notes (Signed)
Subjective: C/o abdominal pain Want morphine- it helps Awake and alert more today.  Objective: Vital signs in last 24 hours: Temp:  [97.6 F (36.4 C)-98.2 F (36.8 C)] 97.6 F (36.4 C) (04/10 0600) Pulse Rate:  [67-82] 82 (04/10 0600) Resp:  [18] 18 (04/10 0600) BP: (122-138)/(64-72) 124/72 mmHg (04/10 0600) SpO2:  [92 %-99 %] 98 % (04/10 0600) Weight change:  Last BM Date: 08/24/13  Intake/Output from previous day: 04/09 0701 - 04/10 0700 In: 2952 [P.O.:1347; I.V.:1605] Out: 1250 [Urine:1250] Intake/Output this shift:    General appearance: alert Resp: clear to auscultation bilaterally GI: distended soft BS present, ascites. tender diffuse.  Lab Results:  Recent Labs  08/24/13 0707 08/25/13 0525  WBC 4.1 PENDING  HGB 6.6* 7.0*  HCT 21.7* 22.6*  PLT 49* PENDING   BMET  Recent Labs  08/24/13 0707 08/25/13 0525  NA 142 138  K 3.8 3.5*  CL 111 106  CO2 18* 18*  GLUCOSE 131* 154*  BUN 30* 27*  CREATININE 1.10 1.03  CALCIUM 7.4* 7.6*    Studies/Results: Dg Chest Port 1 View  08/24/2013   CLINICAL DATA:  Respiratory failure  EXAM: PORTABLE CHEST - 1 VIEW  COMPARISON:  08/22/2013  FINDINGS: Endotracheal tube removed. Right jugular central venous catheter tip unchanged at the cavoatrial junction. No pneumothorax.  Interval increase in mild bibasilar atelectasis. No edema or effusion.  IMPRESSION: Endotracheal tube removed.  Increase in bibasilar atelectasis.   Electronically Signed   By: Marlan Palauharles  Clark M.D.   On: 08/24/2013 08:05    Medications: I have reviewed the patient's current medications.  Assessment/Plan:  Repiratory failure with severe lactic acidosis/ hepatic encphalopathy - s/p intubation; extubated 08/23/13- Chest xray- Atelectasis- incentive spirometry- oob chair  End stage Liver Disease/ cirrhosis worseing-  Supportive care.- continue lactulose and rifaximin UGI bleed- variceal bleed- s/p Endo- per GI- receive 7 unit of blood. - no more blood  product for now- HGB stable Depression - no med  Pancytopenia due to cirrohsis  Pallative care consult / hospice appropriate- patient want pain control and comfort. May transfer to Hospice inpatient- after meeting today with family. Advance diet DNR   LOS: 7 days   [name] [name] 08/25/2013, 7:08 AM

## 2013-08-25 NOTE — Progress Notes (Signed)
Chaplain met with and spent time with pt and wife.  Pt's wife excused herself early into visit, but mentioned that pt ahd struggles with addiction throughout his life.  Chaplain then spent time engaging the pt with a discussion on death and dying as well as his understanding of addiction.  Chaplain was actively listening and responding but pt's medication started to kick in and he was falling in and out of sleep.  Chaplain said prayer with pt and ended visit.

## 2013-08-25 NOTE — Progress Notes (Signed)
Full note to follow:  I met today with [name] [name] and his wife, [name] [name], this morning. They both desire focus and shift to full comfort measures. Pain and anxiety management is being addressed. Spoke with nursing to contact me if he is not getting relief and medications may be titrated. Also spoke with [name] - CSW about their desire for [facility].   Vinie Sill, NP Palliative Medicine Team Pager # [phone] (M-F 8a-5p) Team Phone # [phone] (Nights/Weekends)

## 2013-08-25 NOTE — Progress Notes (Signed)
Patient transition to palliative care measures noted.  Will accordingly sign-off; please call back with any questions or concerns.

## 2013-08-25 NOTE — Progress Notes (Signed)
CSW went into patient's room to offer choice for residential hospice but patient was sleeping. CSW spoke with patient's wife and they are [facility]. CSW contacted Forrestine Himva Davis and made an official  [facility].  At this time they do not have any beds available. They will follow over the weekend.    Sabino NiemannAmy Jabril Pursell, MSW, [employer]CSWA [phone]

## 2013-08-25 NOTE — Progress Notes (Signed)
Lab tried three times to obtain blood specimen from patient. MD on call notified. MD on call ordered to wait until morning. Will continue to monitor.

## 2013-08-25 NOTE — Consult Note (Signed)
Patient FY:BOFBP Summons      DOB: 07/09/1960      ZWC:[id number]     Consult Note from the Palliative Medicine Team at [place] Requested by: Dr. Lysle Rubens     PCP: Wenda Low, MD Reason for Consultation: [place] and hospice options.  Phone Number:[phone]  Assessment of patients Current state: [name] [name] is a 53 yo male with cirrhosis, hep C, esophageal varices, diverticulosis, alcohol and polysubstance abuse. Treated this admission for upper GI bleeding and encephalopathy requiring intubation. Since has been extubated, now DNR, wishing for comfort.   I met today with [name] [name] and his wife, Santiago Glad (separated but legally married), which is who he told me he wanted to help him make decisions. They have had a difficult past as Santiago Glad says that he had used all their finances and pawned valuables to buy drugs - so she is concerned with financial burden. [name] [name] tells me that he is dying and that he feels it won't be too long now (we discussed days to weeks). He is tired and he says he just wants to be out of pain. His pain is generalized but mainly in his RUQ abdomen 10/10 with little relief of 8/10 for <1 hour after receiving morphine 4 mg IV. He tells me he is also anxious and Santiago Glad confirms that he has always been very anxious with a lot of social anxiety. Blaize tells me that xanax works best for his anxiety. Santiago Glad also says that his tolerance if very high and that anything less than xanax 2 mg has not been effective in the past. He is asking for regular diet and to be comfortable. He tells me he is tired of being in the hospital and we discuss d/c lab work, cardiac monitoring, and minimizing medications and sleep interruptions for comfort. He wants full comfort measures. They would like to go to [facility].    Goals of Care: 1.  Code Status: DNR   2. Scope of Treatment: 1. Vital Signs: daily  2. Respiratory/Oxygen: for comfort 3. Nutritional Support/Tube Feeds: no  4. Antibiotics:  continue until transfer to hospice 5. Review of Medications to be discontinued: minimize for comfort 6. Labs: no 7. Telemetry: no 8. Consults: CSW, spiritual care   4. Disposition: Hopeful for [employer].    3. Symptom Management:   1. Anxiety/Agitation: Xanax 2 mg TID prn. Increase frequency as needed.  2. Pain: Dilaudid 1 mg every 2 hrs as needed. May increase dose/frequency as needed. If frequent doses required consider continuous infusion.  3. Bowel Regimen: Lactulose scheduled.   4. Psychosocial: Emotional support provided to patient and family.   5. Spiritual: Spiritual care consulted.    Brief HPI:  53 yo male with cirrhosis, hep C, esophageal varices, diverticulosis. Treated this admission for upper GI bleeding and encephalopathy requiring intubation. Since has been extubated, now DNR, wishing for comfort.   ROS: + pain (RUQ abdomen), + anxiety, denies nausea/constipation    PMH:  Past Medical History  Diagnosis Date  . Thrombocytopenia   . Cirrhosis   . Hepatitis C   . Hypertension   . Anxiety   . Anemia   . Blood transfusion   . Obesity   . Pancreatitis   . Bipolar disorder, unspecified 09/27/2012  . Depression 09/27/2012     PSH: Past Surgical History  Procedure Laterality Date  . Tonsillectomy    . Esophagogastroduodenoscopy N/A 01/05/2013    Procedure: ESOPHAGOGASTRODUODENOSCOPY (EGD);  Surgeon: Winfield Cunas., MD;  Location: [facility] ENDOSCOPY;  Service: Endoscopy;  Laterality: N/A;  . Esophagogastroduodenoscopy N/A 07/07/2013    Procedure: ESOPHAGOGASTRODUODENOSCOPY (EGD);  Surgeon: Wonda Horner, MD;  Location: [facility] ENDOSCOPY;  Service: Endoscopy;  Laterality: N/A;  . Esophagogastroduodenoscopy N/A 08/18/2013    Procedure: ESOPHAGOGASTRODUODENOSCOPY (EGD);  Surgeon: Lear Ng, MD;  Location[facility] ENDOSCOPY;  Service: Endoscopy;  Laterality: N/A;   I have reviewed the [place] and [facility] and  If appropriate update it with new information. Allergies   Allergen Reactions  . Trileptal [Oxcarbazepine] Other (See Comments)    Sleep walking & hallucinations   Scheduled Meds: . antiseptic oral rinse  15 mL Mouth Rinse BID  . feeding supplement (ENSURE COMPLETE)  237 mL Oral TID BM  . lactulose  30 g Oral TID  . pantoprazole  40 mg Oral BID  . rifaximin  550 mg Oral BID  . sodium chloride  10-40 mL Intracatheter Q12H   Continuous Infusions:  PRN Meds:.sodium chloride, morphine injection, sodium chloride    BP 124/72  Pulse 82  Temp(Src) 97.6 F (36.4 C) (Oral)  Resp 18  Ht 6' 5.17" (1.96 m)  Wt 142 kg (313 lb 0.9 oz)  BMI 36.96 kg/m2  SpO2 98%   PPS: 30% at best   Intake/Output Summary (Last 24 hours) at 08/25/13 0935 Last data filed at 08/25/13 0106  Gross per 24 hour  Intake   1125 ml  Output   1250 ml  Net   -125 ml   LBM: 08/24/13                         Physical Exam:  General: NAD, lethargic, ill appearing HEENT: Scleral icterus, jaundice Chest: Decreased bases, no labored breathing CVS: RRR, S1 S2 Abdomen: Distended, ascites, tender to palpation Ext: MAE, edema throughout, LUA eccymosis Neuro: Alert, oriented x 3 but lethargic  Labs: CBC    Component Value Date/Time   WBC 5.9 08/25/2013 0525   RBC 3.05* 08/25/2013 0525   HGB 7.0* 08/25/2013 0525   HCT 22.6* 08/25/2013 0525   PLT 66* 08/25/2013 0525   MCV 74.1* 08/25/2013 0525   MCH 23.0* 08/25/2013 0525   MCHC 31.0 08/25/2013 0525   RDW 22.9* 08/25/2013 0525   LYMPHSABS 0.7 08/21/2013 0402   MONOABS 1.1* 08/21/2013 0402   EOSABS 0.1 08/21/2013 0402   BASOSABS 0.0 08/21/2013 0402    BMET    Component Value Date/Time   NA 138 08/25/2013 0525   K 3.5* 08/25/2013 0525   CL 106 08/25/2013 0525   CO2 18* 08/25/2013 0525   GLUCOSE 154* 08/25/2013 0525   BUN 27* 08/25/2013 0525   CREATININE 1.03 08/25/2013 0525   CALCIUM 7.6* 08/25/2013 0525   GFRNONAA 82* 08/25/2013 0525   GFRAA >90 08/25/2013 0525    CMP     Component Value Date/Time   NA 138 08/25/2013 0525    K 3.5* 08/25/2013 0525   CL 106 08/25/2013 0525   CO2 18* 08/25/2013 0525   GLUCOSE 154* 08/25/2013 0525   BUN 27* 08/25/2013 0525   CREATININE 1.03 08/25/2013 0525   CALCIUM 7.6* 08/25/2013 0525   PROT 5.3* 08/25/2013 0525   ALBUMIN 2.3* 08/25/2013 0525   AST 82* 08/25/2013 0525   ALT 169* 08/25/2013 0525   ALKPHOS 71 08/25/2013 0525   BILITOT 4.5* 08/25/2013 0525   GFRNONAA 82* 08/25/2013 0525   GFRAA >90 08/25/2013 0525     Time In Time Out Total Time Spent  with Patient Total Overall Time  0900 1010 65mn 726m    Greater than 50%  of this time was spent counseling and coordinating care related to the above assessment and plan.  AlVinie SillNP Palliative Medicine Team Pager # [phone]-F 8a-5p) Team Phone # [phone]/Weekends)

## 2013-08-25 NOTE — Progress Notes (Signed)
Palliative Care has ordered a mattress overlay as comfort measures for patient. Because the hospital uses a different vendor for this product, it is now called something different... ??and [facility] as [facility]. The patient refused to stand and transfer to the pressure-eliminating bed. RN said we would bring the bed if he changed his mind. Will continue to monitor.

## 2013-08-26 NOTE — Progress Notes (Signed)
Seems to be comfortable. Mainly wanted some soda to drink.  Very lethargic, slow to respond.   No changes.   Plans for placement noted.

## 2013-08-27 NOTE — Progress Notes (Signed)
Patient states he is comfortable.   No change in status today.   I did not make any changes to meds, etc.

## 2013-08-27 NOTE — Progress Notes (Signed)
Patient refused mattress overlay. Bed sent back with portable.

## 2013-08-28 DIAGNOSIS — J9600 Acute respiratory failure, unspecified whether with hypoxia or hypercapnia: Secondary | ICD-10-CM

## 2013-08-28 MED ORDER — LORAZEPAM 2 MG/ML IJ SOLN: 1.0000 mg | INTRAMUSCULAR | Status: DC | PRN

## 2013-08-28 NOTE — Consult Note (Signed)
[facility] Liaison: Received request from CSW [name] for family [facility] 08/25/[facility]. Made CSW aware at that time no [facility] availability. Unfortunately still no availability. CSW aware. Will update CSW if availability for this patient changes. Thank you. Forrestine Himva Doyl Bitting LCSW [phone]

## 2013-08-28 NOTE — Progress Notes (Addendum)
Pt is refusing to be turned , have oral care,scd's.,refusing all meds x dilaudid IV prn,takes few sips of sprite only.Continue comfort care,no family or visitors today. Doren Custardynthia D Lachlyn Vanderstelt

## 2013-08-28 NOTE — Progress Notes (Signed)
Progress Note from [facility] Palliative Medicine Team at [facility]  Mr. [name] [name] is more lethargic today but able to speak in small phrases/sentences with much effort and time. He is requesting pain medication and is mostly sleeping. He did drink some iced tea while I was in room. I do not believe he will live much longer as he has declined greatly over the weekend (prognosis days). Nursing says pain has been managed on regimen but will consider continuous infusion as needed for increased pain.   Yong ChannelAlicia Larysa Pall, NP Palliative Medicine Team Pager # [phone] (M-F 8a-5p) Team Phone # [phone] (Nights/Weekends)

## 2013-08-28 NOTE — Plan of Care (Signed)
Problem: Acute Rehab PT Goals(only PT should resolve) Goal: Pt Will Go Supine/Side To Sit Outcome: Not Met (add Reason) Pt to comfort care Goal: Pt Will Transfer Bed To Chair/Chair To Bed Outcome: Not Met (add Reason) Pt to comfort care Goal: Pt Will Ambulate Outcome: Not Met (add Reason) Pt to comfort care Goal: Pt/caregiver will Perform Home Exercise Program Outcome: Not Met (add Reason) Pt to comfort care

## 2013-08-28 NOTE — Progress Notes (Signed)
Subjective: Awake but very lethargic Refuse oral med Pain control adequate for now  Objective: Vital signs in last 24 hours: Temp:  [98.6 F (37 C)-100.1 F (37.8 C)] 98.6 F (37 C) (04/13 0336) Pulse Rate:  [95-100] 95 (04/13 0336) Resp:  [18] 18 (04/13 0336) BP: (117-136)/(66-81) 117/66 mmHg (04/13 0336) SpO2:  [96 %-98 %] 96 % (04/13 0336) Weight:  [148.372 kg (327 lb 1.6 oz)] 148.372 kg (327 lb 1.6 oz) (04/13 0300) Weight change: -0.68 kg (-1 lb 8 oz) Last BM Date: 08/24/13  Intake/Output from previous day: 04/12 0701 - 04/13 0700 In: 400 [P.O.:240; I.V.:160] Out: 900 [Urine:900] Intake/Output this shift:    General appearance: slowed mentation GI: ascites, diffusely tender  Lab Results: No results found for this basename: WBC, HGB, HCT, PLT,  in the last 72 hours BMET No results found for this basename: NA, K, CL, CO2, GLUCOSE, BUN, CREATININE, CALCIUM,  in the last 72 hours  Studies/Results: No results found.  Medications: I have reviewed the patient's current medications.  Assessment/Plan: End stage Liver disease/ with lactic acidosis, GI bleed/ Hospice care Actively dying Stop oral meds Dilaudid IV - may need drip; but for now nursing state adequate control [facility] if bed available    LOS: 10 days   [name] [name] 08/28/2013, 7:17 AM

## 2013-08-28 NOTE — Progress Notes (Signed)
Physical Therapy Treatment Patient Details Name: [name] PandaDavid Radi MRN: [id number] DOB: [birth year] Today's Date: 08/28/2013    History of Present Illness 53 year old obese male with US documented Cirrhosis NOS (no ascites feb 2015), Hep C, small esophageal varices and diverticulsos (per chart bx), admitted for upper GIB, hepatic encephalopathy and severe lactic acidosis. As of this morning pt is on palliative care and seeking end of life support from hospice.    PT Comments    PT provided education and passive positioning for comfort and to prevent skin breakdown from immobility. Noted that pt has been placed on comfort care and will therefore no longer need skilled PT services. PT is signing-off.  Follow Up Recommendations  Other (comment) [facility]     Equipment Recommendations  None recommended by PT    Recommendations for Other Services       Precautions / Restrictions Precautions Precautions: Fall    Mobility  Bed Mobility Overal bed mobility: Needs Assistance Bed Mobility: Rolling Rolling: Max assist         General bed mobility comments: Pt repositioned for comfort and to prevent skin breakdown. Placed in sidelying at 45 degrees on L with pillows placed in bony areas to prevent skin breakdown. Pt unable to assist with movement however is conscious and requested to be rolled onto left side for comfort.  Transfers                    Ambulation/Gait                 Stairs            Wheelchair Mobility    Modified Rankin (Stroke Patients Only)       Balance                                    Cognition Arousal/Alertness: Lethargic Behavior During Therapy: Flat affect Overall Cognitive Status: No family/caregiver present to determine baseline cognitive functioning                      Exercises Other Exercises Other Exercises: Gentle passive ROM to lower extremites between positioning to prevent  muscle soreness and tightening    General Comments General comments (skin integrity, edema, etc.): Pt requested to be turned to left side for comfort and better positioning. Requested tea and sprite. PT gave tea/sprite using oral sponge to prevent choking. Pt educated on purpose of PT to help with comfort and positioning to prevent skin break down.      Pertinent Vitals/Pain Pt denies any pain currently Pt repositioned in bed for comfort    Home Living                      Prior Function            PT Goals (current goals can now be found in the care plan section) Acute Rehab PT Goals Patient Stated Goal: none stated Progress towards PT goals: Not progressing toward goals - comment (Pt at end of life)    Frequency       PT Plan Discharge plan needs to be updated (Will sign-off pt for comfort care only)    Co-evaluation             End of Session Equipment Utilized During Treatment: Oxygen Activity Tolerance: Treatment limited secondary to medical  complications (Comment) Patient left: in bed;with call bell/phone within reach;with bed alarm set     Time: [id number] PT Time Calculation (min): 28 min  Charges:  $Self Care/Home Management: 23-37                    G Codes:      Charlsie MerlesLogan Secor Onelia Cadmus, [place]PT [phone]  Berton MountLogan S Lecretia Buczek 08/28/2013, 4:59 PM

## 2013-08-28 NOTE — Progress Notes (Signed)
Nutrition Brief Note  Chart reviewed. Pt now transitioning to comfort care.  No further nutrition interventions warranted at this time.  Please re-consult as needed.   Donyale Falcon RD, LDN, CNSC [phone] Pager [phone] After Hours Pager    

## 2013-08-29 LAB — CULTURE, BLOOD (ROUTINE X 2)
CULTURE: NO GROWTH
Culture: NO GROWTH

## 2013-08-29 MED ORDER — SODIUM CHLORIDE 0.9 % IV SOLN
1.0000 mg/h | INTRAVENOUS | Status: DC
  Administered 2013-08-29 – 2013-08-30 (×2): 1 mg/h via INTRAVENOUS
  Filled 2013-08-29 (×2): qty 2.5

## 2013-08-29 MED ORDER — ALPRAZOLAM 0.5 MG PO TABS: 0.5000 mg | ORAL_TABLET | Freq: Three times a day (TID) | ORAL | Status: DC | PRN

## 2013-08-29 NOTE — Consult Note (Signed)
[facility] Liaison: Continue to follow for [facility]. Unfortunately no availability at this time. Will update CSW if availability for this patient changes. Thank you. Forrestine Himva Babak Lucus LCSW [phone]

## 2013-08-29 NOTE — Progress Notes (Signed)
Subjective: C/o pain in abdomen; lethargic  Objective: Vital signs in last 24 hours: Temp:  [98.1 F (36.7 C)-98.7 F (37.1 C)] 98.7 F (37.1 C) (04/14 0500) Pulse Rate:  [89-92] 90 (04/14 0500) Resp:  [18] 18 (04/14 0500) BP: (133-136)/(82-92) 136/92 mmHg (04/14 0500) SpO2:  [98 %-99 %] 99 % (04/14 0500) Weight:  [148.374 kg (327 lb 1.7 oz)] 148.374 kg (327 lb 1.7 oz) (04/14 0500) Weight change: 0.003 kg (0.1 oz) Last BM Date: 08/28/13  Intake/Output from previous day: 04/13 0701 - 04/14 0700 In: 360 [P.O.:240; I.V.:120] Out: 400 [Urine:400] Intake/Output this shift:    General appearance: alert GI: tender diffusely, distended  Lab Results: No results found for this basename: WBC, HGB, HCT, PLT,  in the last 72 hours BMET No results found for this basename: NA, K, CL, CO2, GLUCOSE, BUN, CREATININE, CALCIUM,  in the last 72 hours  Studies/Results: No results found.  Medications: I have reviewed the patient's current medications.  Assessment/Plan: End stage Liver disease/ with lactic acidosis, GI bleed/  Hospice care  Actively dying  Xanax po for anxiety Dilaudid IV - low dose drip for better pain control [facility] if bed available   LOS: 11 days   [name] [name] 08/29/2013, 7:15 AM

## 2013-08-29 NOTE — Clinical Social Work Note (Signed)
Clinical Social Worker continuing to follow patient and family for support and discharge planning needs.  CSW spoke with patient wife over the phone who states [facility] but would be open to [facility].  CSW to initiate referral and follow up with patient wife regarding bed availability.  Patient wife verbalized her understanding and agreement with placement at either facility.  CSW remains available for support and to facilitate patient discharge needs once bed is available.  Macario GoldsJesse Freda Jaquith, LCSW [phone]  (Covering [name] Riley KillStuckey [phone])

## 2013-08-29 NOTE — Progress Notes (Signed)
[facility] to [facility] for [facility] evaluation for patient.  Attending physician Dr. Donette LarryHusain ordered a dialudid gtt this AM for pain/symptom control, patient had declined significantly from yesterday, not stable for transport OOF.  Per PMT patient appears declined as well, not stable for transport.  [facility], ([facility]).    I spoke with Clydie BraunKaren, spouse, discussed GIP option, she is agreeable.  States that this is a difficult situation for her as she and Onalee HuaDavid have been separated and estranged for about 18 months.  She is willing to meet with [facility] and complete what is necessary for GIP admission.  Spoke with RNCM with above information, [facility] to evaluate and contact Clydie BraunKaren.  Dr. Donette LarryHusain will transfer attending to PMT if patient is accepted to GIP.    Cindra PresumeSue Ellen Alaysiah Browder, RN, MSN, [facility] Palliative Integration

## 2013-08-29 NOTE — Discharge Summary (Signed)
Physician Discharge Summary  Patient ID: Marva PandaDavid Browder MRN: [id number] DOB/AGE: 52-May-1962 52 y.o.  Admit date: 08/18/2013 Discharge date: 08/29/2013  Admission Diagnoses:  Discharge Diagnoses:  Active Problems:   Lactic acidosis   Gastrointestinal hemorrhage   Acute respiratory failure with hypoxia   Shock circulatory   Palliative care encounter   Discharged Condition: poor  Hospital Course: [age] yeas old mle with liver cirrhosis;history of previous alcoholic hepatitis; hepatitis C  hhistory of GI bleeds; encephaloathy  admitted with acte menatl status changes; GI bled; lactic acidosis  Problem #1: severe lactic acidosis: required intubation due to respiratory failure admittd to the ICU-patient required biarbonate and IV hydration-ICU care; exudated  Upper GI bleed- multiple blood transffuion- over 7 units. After discussion with the ICU and GI doctor;  palliatve care was cosulted-4 end-stage liver disase; no further aggressive treatments.  Patient had endoscopy Done while in the ICU  He as continued on lactulose; started on rifixmin - for hepatic encephalopathy with minimum  impovement;  he hd no frther GI bleeds Hospice and palliativecare consult obtained- after discussion with the family-patient made- comfort care only Stopped all oral medications Continue  Pan medication with Hydromorpine  Drip lorazepam and Ativan right.prn. DNR Patientt trasferred to hospice services [facility]waiting [facility] bed transfer. Consults: palliative and hospce care; GI;  pulmonary  Significant Diagnostic Studies: labs: seedetails in the epic, microbiology: blood culture: negative and radiology: CXR: normal and CT scan: aascites and cirrhosis  Treatments: IV hydration and antibiotics: ceftriaxone  Discharge Exam: Blood pressure 128/67, pulse 98, temperature 99.1 F (37.3 C), temperature source Oral, resp. rate 14, height 6' 5.17" (1.96 m), weight 148.374 kg (327 lb 1.7 oz), SpO2 94.00%. General  appearance: leethrgic GI: distendded with diffuse tenderness  Disposition: hospice services     Medication List    Notice   You have not been prescribed any medications.     discharge time taken 40 min.  Signed: Georgann HousekeeperKarrar Ryosuke Ericksen 08/29/2013, 4:33 PM

## 2013-08-30 ENCOUNTER — Inpatient Hospital Stay (HOSPITAL_COMMUNITY)
Admission: AD | Admit: 2013-08-30 | Discharge: 2013-09-15 | DRG: 432 | Disposition: E | Source: Ambulatory Visit | Attending: Internal Medicine | Admitting: Internal Medicine

## 2013-08-30 ENCOUNTER — Encounter (HOSPITAL_COMMUNITY): Payer: Self-pay | Admitting: Internal Medicine

## 2013-08-30 DIAGNOSIS — K7290 Hepatic failure, unspecified without coma: Secondary | ICD-10-CM

## 2013-08-30 DIAGNOSIS — Z87891 Personal history of nicotine dependence: Secondary | ICD-10-CM

## 2013-08-30 DIAGNOSIS — K7460 Unspecified cirrhosis of liver: Principal | ICD-10-CM

## 2013-08-30 DIAGNOSIS — F1020 Alcohol dependence, uncomplicated: Secondary | ICD-10-CM

## 2013-08-30 DIAGNOSIS — K7682 Hepatic encephalopathy: Secondary | ICD-10-CM

## 2013-08-30 DIAGNOSIS — E872 Acidosis, unspecified: Secondary | ICD-10-CM | POA: Diagnosis present

## 2013-08-30 DIAGNOSIS — R0609 Other forms of dyspnea: Secondary | ICD-10-CM | POA: Diagnosis present

## 2013-08-30 DIAGNOSIS — F319 Bipolar disorder, unspecified: Secondary | ICD-10-CM | POA: Diagnosis present

## 2013-08-30 DIAGNOSIS — I10 Essential (primary) hypertension: Secondary | ICD-10-CM | POA: Diagnosis present

## 2013-08-30 DIAGNOSIS — Z66 Do not resuscitate: Secondary | ICD-10-CM | POA: Diagnosis present

## 2013-08-30 DIAGNOSIS — G9340 Encephalopathy, unspecified: Secondary | ICD-10-CM

## 2013-08-30 DIAGNOSIS — K922 Gastrointestinal hemorrhage, unspecified: Secondary | ICD-10-CM | POA: Diagnosis present

## 2013-08-30 DIAGNOSIS — R0989 Other specified symptoms and signs involving the circulatory and respiratory systems: Secondary | ICD-10-CM | POA: Diagnosis present

## 2013-08-30 DIAGNOSIS — Z8619 Personal history of other infectious and parasitic diseases: Secondary | ICD-10-CM

## 2013-08-30 DIAGNOSIS — E669 Obesity, unspecified: Secondary | ICD-10-CM | POA: Diagnosis present

## 2013-08-30 DIAGNOSIS — K709 Alcoholic liver disease, unspecified: Secondary | ICD-10-CM | POA: Diagnosis present

## 2013-08-30 DIAGNOSIS — Z515 Encounter for palliative care: Secondary | ICD-10-CM

## 2013-08-30 MED ORDER — BIOTENE DRY MOUTH MT LIQD
15.0000 mL | Freq: Two times a day (BID) | OROMUCOSAL | Status: DC
Start: 2013-08-30 — End: 2013-08-30

## 2013-08-30 MED ORDER — SCOPOLAMINE 1 MG/3DAYS TD PT72
1.0000 | MEDICATED_PATCH | TRANSDERMAL | Status: DC
  Administered 2013-08-30: 1.5 mg via TRANSDERMAL
  Filled 2013-08-30: qty 1

## 2013-08-30 MED ORDER — ACETAMINOPHEN 650 MG RE SUPP: 650.0000 mg | Freq: Four times a day (QID) | RECTAL | Status: DC | PRN

## 2013-08-30 MED ORDER — SODIUM CHLORIDE 0.9 % IV SOLN
INTRAVENOUS | Status: DC
  Administered 2013-08-30: 13:00:00 via INTRAVENOUS

## 2013-08-30 MED ORDER — ACETAMINOPHEN 325 MG PO TABS: 650.0000 mg | ORAL_TABLET | Freq: Four times a day (QID) | ORAL | Status: DC | PRN

## 2013-08-30 MED ORDER — ALPRAZOLAM 0.5 MG PO TABS: 0.5000 mg | ORAL_TABLET | Freq: Three times a day (TID) | ORAL | Status: DC | PRN

## 2013-08-30 MED ORDER — LORAZEPAM 2 MG/ML IJ SOLN: 1.0000 mg | INTRAMUSCULAR | Status: DC | PRN

## 2013-08-30 MED ORDER — ENSURE COMPLETE PO LIQD: 237.0000 mL | Freq: Three times a day (TID) | ORAL | Status: DC

## 2013-08-30 MED ORDER — HYDROMORPHONE HCL PF 10 MG/ML IJ SOLN
1.0000 mg/h | INTRAMUSCULAR | Status: DC
  Administered 2013-08-30: 1 mg/h via INTRAVENOUS

## 2013-08-30 MED ORDER — SODIUM CHLORIDE 0.9 % IV SOLN: 250.0000 mL | INTRAVENOUS | Status: DC | PRN

## 2013-08-30 MED ORDER — HYDROMORPHONE BOLUS VIA INFUSION
0.2000 mg | INTRAVENOUS | Status: DC | PRN
  Filled 2013-08-30: qty 1

## 2013-08-30 MED ORDER — SODIUM CHLORIDE 0.9 % IV SOLN
1.0000 mg/h | INTRAVENOUS | Status: DC
  Filled 2013-08-30: qty 2.5

## 2013-08-30 MED ORDER — ATROPINE SULFATE 1 % OP SOLN
4.0000 [drp] | OPHTHALMIC | Status: DC | PRN
  Filled 2013-08-30: qty 2

## 2013-08-30 MED ORDER — SODIUM CHLORIDE 0.9 % IV SOLN: 1.0000 mg/h | INTRAVENOUS | Status: DC

## 2013-08-30 NOTE — Progress Notes (Signed)
Inpatient [facility] Rm 5N11 D [place] [facility] RN Visit- M. Wynetta Emery, RN  Related admission to GIP level of care [facility] diagnosis of Cirrhosis NOS (571.5)  DNR code status   Pt information reviewed with [place] Director and discussed with PMT Dr Hilma Favors yesterday who felt at this time pt continuing to decline and unstable for transport. Pt seen this morning; discussed with staff RN; pt seen at bedside unresponsive to voice or light touch, jaw slack open mouth breathing, irregular respirations, RR= 20 with 5-10 second periods of apnea noted; O2 sat=88% on 3.5 LNC; elevated temperature; tachycardic; no grimacing, groaning noted; medications adjusted yesterday for pain/symptom management -currently on Dilaudid 1 mg/hr continuous;  Scopolamine patch in place. Pt continues to require skilled nursing assessment for non-verbal s/sx of pain/discomfort and effectiveness of symptom management No family present at time of visit; [facility] SW met with pt's wife to review services and sign consents; wife aware [facility] follows daily and collaborates with Dr Lovena Le PMT Attending physician      Please call [place] @ [phone]-  with any hospice needs.   Thank you.  Danton Sewer, RN  [facility]  Hospice Liaison  [phone])

## 2013-08-30 NOTE — H&P (Signed)
  Patient ZO:XWRUE:[name] [name]      DOB: [birth year]      AVW:[id number]  [facility]  I have seen and examined the patient.  Full note to follow.  Discussed with Hospice SW.  Spouse is legal surrogate and has requested no further calls until his time of death.  Psychosocial stressors are significant for [name] [name].Will respect [name] [name] wish not to be called.  She is aware that I will be the attending of record and can reach me at anytime. Please see Hospice SW note.  Time: 1100-1125 am  Kyal Arts L. Ladona Ridgelaylor, MD MBA The Palliative Medicine Team at [facility] Team Phone: [phone] Pager: [phone]

## 2013-08-30 NOTE — H&P (Signed)
Palliative Medicine Team at [facility]  History and Physical:    [name] PandaDavid [name] NGE:[id number] DOB: 1960/05/28 DOA: 02-15-2014  Referring physician: Dr. Donette LarryHusain PCP: Georgann HousekeeperHUSAIN,KARRAR, MD   Chief Complaint: Hepatic Encephalopathy, ESLD, Cirrhosis  History of Present Illness:   [name] [name] is an 53 y.o. male  With known history of alcoholic Hepatitis , Hepatitis C admitted on 4/3 with hepatic encephalopathy.  Course was complicated by lactic acidosis, GI bleeding required endoscopy  and required intubation.  Patient and spouse participated in goals of care and decided on comfort measures. Patient was transitioned to symptom management and over the weekend required the initiation of opiate infusion for comfort ( pain control).  Today the patient is obtunded, but appears comfortable.  He is starting to develop some upper air way secretions.  No family is at the bedside.  I spoke with the Hospice social worker who let me know that the patient's spouse would be indisposed at work and really only wanted to be called at the time of death, if possible.  This has been very difficult for her.  I will respect her wishes to move forward with hospice admission and update only as necessary.  ROS:   Constitutional: Unable to be obtained due to encephalopathy  Past Medical History:   Past Medical History  Diagnosis Date  . Thrombocytopenia   . Cirrhosis   . Hepatitis C   . Hypertension   . Anxiety   . Anemia   . Blood transfusion   . Obesity   . Pancreatitis   . Bipolar disorder, unspecified 09/27/2012  . Depression 09/27/2012    Past Surgical History:   Past Surgical History  Procedure Laterality Date  . Tonsillectomy    . Esophagogastroduodenoscopy N/A 01/05/2013    Procedure: ESOPHAGOGASTRODUODENOSCOPY (EGD);  Surgeon: Vertell NovakJames L Edwards Jr., MD;  [facility];  Service: Endoscopy;  Laterality: N/A;  . Esophagogastroduodenoscopy N/A 07/07/2013    Procedure: ESOPHAGOGASTRODUODENOSCOPY  (EGD);  Surgeon: Graylin ShiverSalem F Ganem, MD;  [facility];  Service: Endoscopy;  Laterality: N/A;  . Esophagogastroduodenoscopy N/A 08/18/2013    Procedure: ESOPHAGOGASTRODUODENOSCOPY (EGD);  Surgeon: Shirley FriarVincent C. Schooler, MD;  [facility];  Service: Endoscopy;  Laterality: N/A;    Social History:   History   Social History  . Marital Status: Married    Spouse Name: N/A    Number of Children: N/A  . Years of Education: N/A   Occupational History  . Not on file.   Social History Main Topics  . Smoking status: Former Smoker    Types: Cigarettes    Quit date: 11/05/2003  . Smokeless tobacco: Not on file  . 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 states last etoh was 2011  . Drug Use: Yes    Special: Cocaine, Hydrocodone, Benzodiazepines     Comment: opiates   . Sexual Activity: No   Other Topics Concern  . Not on file   Social History Narrative  . No narrative on file    Family history:   Family History  Problem Relation Age of Onset  . Cancer - Other Mother     Uterine  . Coronary artery disease Father   . Coronary artery disease Paternal Grandfather   . Hypertension Father   . Hypertension Paternal Grandfather     Allergies   Trileptal  Current Medications:   Prior to Admission medications   Not on File    Physical Exam:  T: 100.7 P: 110  BP 116/60  R: 18 Sat 88 percent Condom cath   Physical Exam:  Gen: No acute distress. Appears comfortable on current drip. Eyes: PERRL, minimally reactive, Can;t assess EOMI, sclerae icteric. Mouth: Oropharynx dry Chest: Lungs decreased with some subtle upper airway rhonchi CV: Heart sounds distant, tachy, S1, S2 Abdomen: Soft, nontender, obese, slightly extended. Extremities: Extremities edema, 1 + Skin: Warm and dry. No mottling Neuro: obtunded, does not follow commands, GCS 3 Psych:  Unable to assess   Data Review:    Labs: Basic Metabolic  Panel:  Recent Labs Lab 08/23/13 1700 08/24/13 0707 08/25/13 0525  NA 152* 142 138  K 3.5* 3.8 3.5*  CL 122* 111 106  CO2 18* 18* 18*  GLUCOSE 145* 131* 154*  BUN 35* 30* 27*  CREATININE 1.07 1.10 1.03  CALCIUM 7.5* 7.4* 7.6*  MG  --  2.6*  --   PHOS  --  2.9  --    Liver Function Tests:  Recent Labs Lab 08/24/13 0707 08/25/13 0525  AST 109* 82*  ALT 208* 169*  ALKPHOS 53 71  BILITOT 5.1* 4.5*  PROT 5.1* 5.3*  ALBUMIN 2.3* 2.3*     CBC:  Recent Labs Lab 08/24/13 0707 08/25/13 0525  WBC 4.1 5.9  HGB 6.6* 7.0*  HCT 21.7* 22.6*  MCV 75.1* 74.1*  PLT 49* 66*    BNP (last 3 results)  Recent Labs  08/18/13 1847  PROBNP 51.7   CBG:  Recent Labs Lab 08/23/13 1556 08/23/13 1937 08/23/13 2349 08/24/13 0346  GLUCAP 150* 154* 97 132*    Radiographic Studies: IMPRESSION:  Endotracheal tube removed. Increase in bibasilar atelectasis.      Assessment/Plan:   Active Problems:   CIRRHOSIS Hepatitis C Alcohol abuse   1. Comfort Care 2. Terminal secretions: add scopolamine and prn atropine 3.  Dyspnea: controlled on current dilaudid dosing 4.  Pain: currently controlled on current morphine infusion. 5.  Encephalopathy: hepatic inorigin, continue comfort care.    Code Status: DNR Family Communication:  Spouse worked with Hospice social worker to obtain permits this am .  Respecting wish not to call her . Will update as events change. Disposition Plan: Expecting hospital death  Time spent: 1100 to 1125 am  Leyton Brownlee L. Ladona Ridgelaylor, MD MBA The Palliative Medicine Team at [facility] Team Phone: [phone] Pager: [phone]

## 2013-08-30 NOTE — Progress Notes (Signed)
No Dilaudid remains in IV bag prior to hanging new bag. Witnessed by Briant SitesSylvia Howell, RN.

## 2013-08-30 NOTE — Progress Notes (Signed)
[facility] [place] Work note: Met with spouse Santiago Glad at her place of employment this morning to complete hospice admission paperwork. Per Santiago Glad they have been married twelve years and are separated at this time. She has been participating in goals of care and decision making and is agreeable to hospice services. She is requesting hospital phone calls to her be minimized due to she works and is not easily able to take calls. She does not plan to return to the hospital and is requesting to be contacted at time of death. Acknowledged her request and made her ware we will do our best to honor her request. Spouse made patient's brothers aware of hospitalization during early part of hospital. Per hospital staff at least one brother has visited. Spouse is aware [facility] RN and SW visit daily and collaborate with Dr. Lovena Le. Note hospital chaplain visited earlier in week. Have made team and RN aware of spouse's request. Have requested hospital staff contact me if brothers visit. Please do not hesitate to contact me for support to family. Thank you. Erling Conte LCSW [phone]

## 2013-08-31 DIAGNOSIS — G9340 Encephalopathy, unspecified: Secondary | ICD-10-CM

## 2013-09-04 NOTE — Consult Note (Signed)
I have reviewed and discussed the care of this patient in detail with the nurse practitioner including pertinent patient records, physical exam findings and data. I agree with details of this encounter.  

## 2013-09-15 NOTE — Progress Notes (Signed)
Body tagged and transported to morgue.

## 2013-09-15 NOTE — Progress Notes (Signed)
At [phone] this morning patient expired, two RN's verified via apical and carotid pulse absence.  Patient's wife was notified and she mentioned that she would like his [facility].  Palliative Care notified.  Nilda Riggsiffany Whitfield, RN called back and received information.  [facility] was contacted, patient was not a candidate for donation. Body can be release; reference # [id number] spoke with Mr. Spencer Flowers.  Patient expired comfortably.

## 2013-09-15 NOTE — Progress Notes (Signed)
Patient remains unresponsive.  Vitals at 0500 T 103.1 HR 89 R 24 BP 74/36 O2 79%.  Non-rebreather has been placed to assist with labored breathing.  Dilaudid drip still in place.  Patient appears comfortable. Will continue to monitor.

## 2013-09-15 NOTE — Progress Notes (Signed)
Patient has been unresponsive this shift.  He does moan when we reposition him but that was all.  His O2 sat has been in the upper 80's on 4L. Lungs sounds -  Crackles.  Dilaudid drip remains in place.  Will continue to monitor.

## 2013-09-15 NOTE — Discharge Summary (Signed)
Death Summary  [name] PandaDavid [name] ZOX:[id number] DOB: 04-05-1961 DOA: 09/14/2013  PCP: [name] HousekeeperHUSAIN,KARRAR, MD PCP/Office notified: By Dr. Ladona Ridgelaylor  Admit date: 08/30/2013 Date of Death: Dec 07, 2013  Final Diagnoses:  Active Problems:   CIRRHOSIS Hepatitis C  Alcoholic liver Disease    History of present illness: 53 yr old white male with history of Hepatitis C, Alcoholic liver disease admitted with hepatic encephalopathy. Course complicated by lactic acidosis and gi bleed .  Patient was intubated and subsequently extubated.  Goals were established to pursue comfort care in the face of end stage disease .     Hospital Course:  Patient was treated for dyspnea and pain.  He required an opiate drip toward the end of life.  He was found this am without respirations or heart beat.  Time of death: 320646.   Time of Death: 540646.  Signed: Aaminah Forrester L. Ladona Ridgelaylor, MD MBA The Palliative Medicine Team at [facility] Team Phone: [phone] Pager: [phone]

## 2013-09-15 DEATH — deceased

## 2015-02-18 IMAGING — US US RENAL
1 series · 14 of 23 positions shown · non-contrast
Comparison: CT abdomen and pelvis 01/23/2010.

CLINICAL DATA: Hematuria.  Right flank pain.  The patient has
recently passed a several urinary tract calculi.  Current history
of psoriasis, hepatitis C, hypertension, and thrombocytopenia.

RENAL/URINARY TRACT ULTRASOUND COMPLETE

[Series 1: us renal · 0.32mm/px · 14 of 23 slices shown]
[im 1/23]
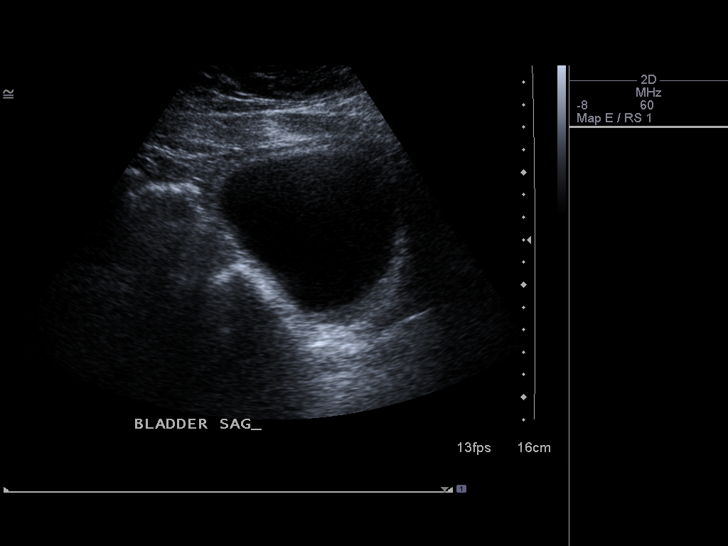
[im 3/23]
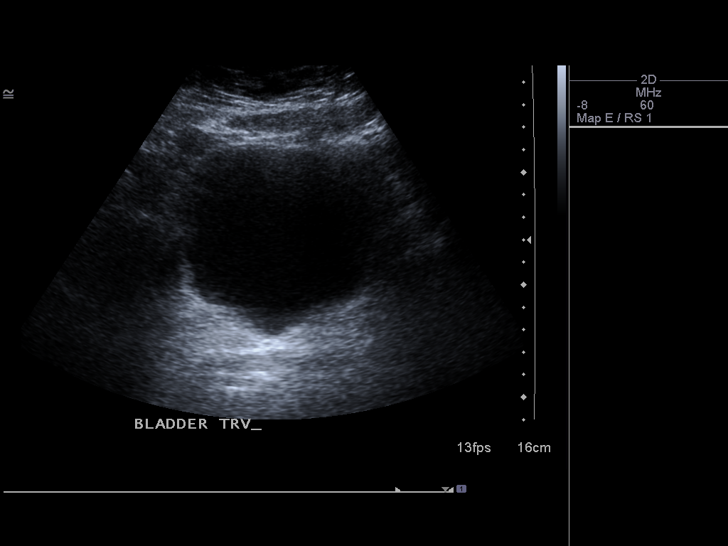
[im 5/23]
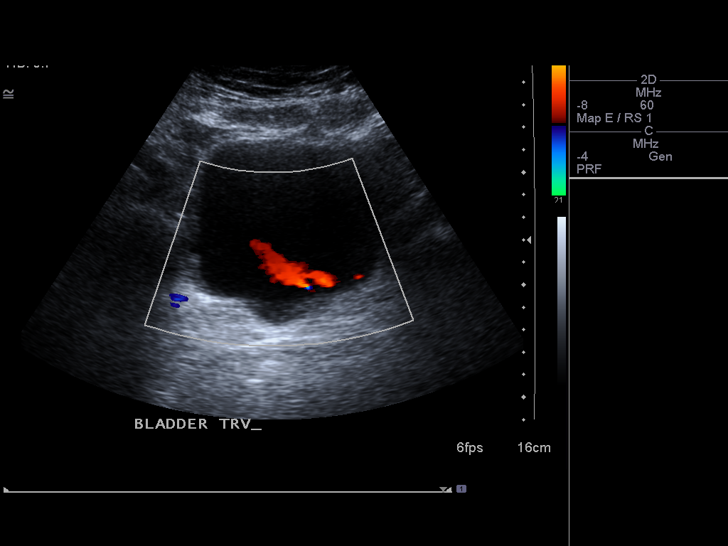
[im 6/23]
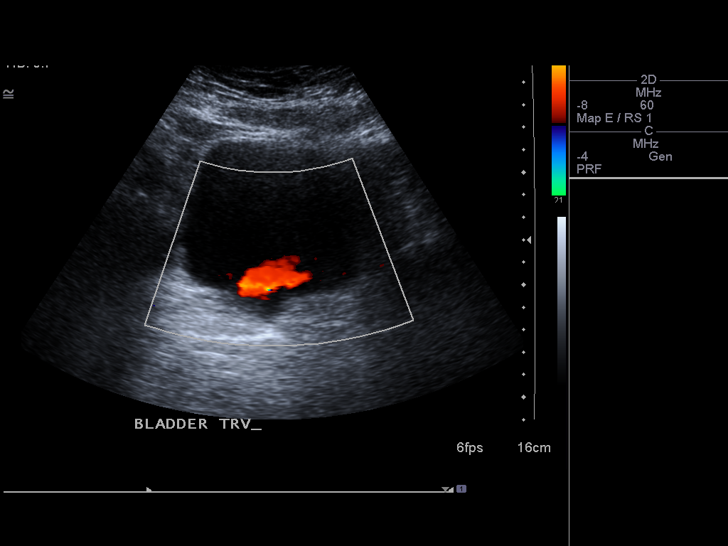
[im 8/23]
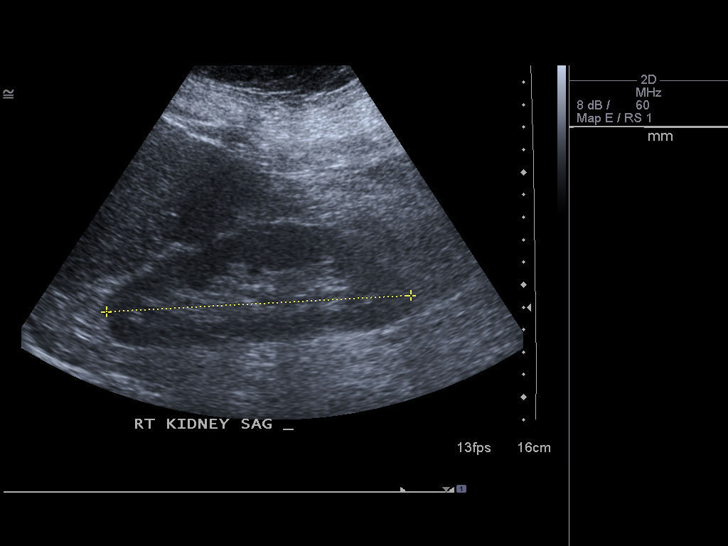
[im 10/23]
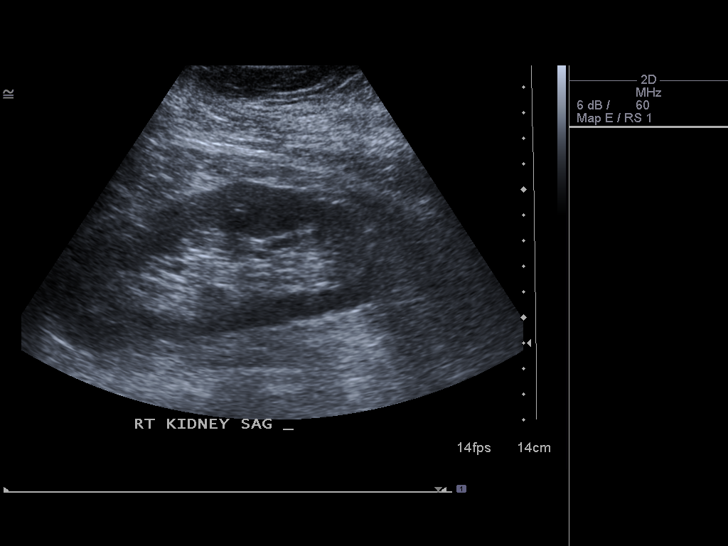
[im 11/23]
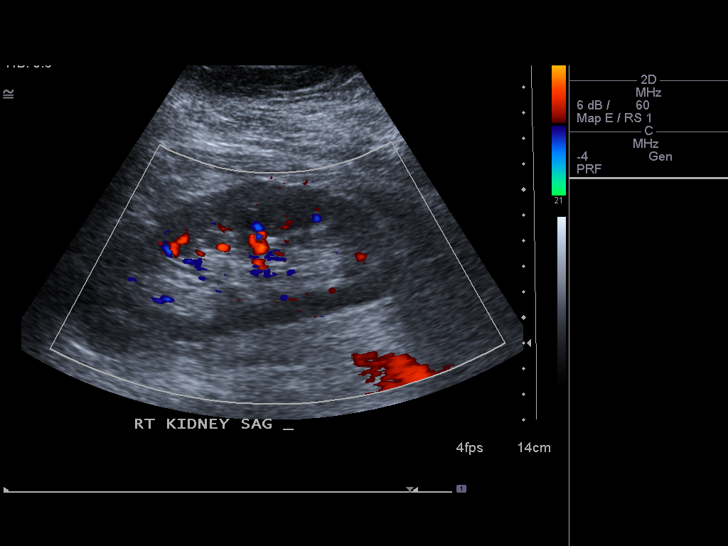
[im 13/23]
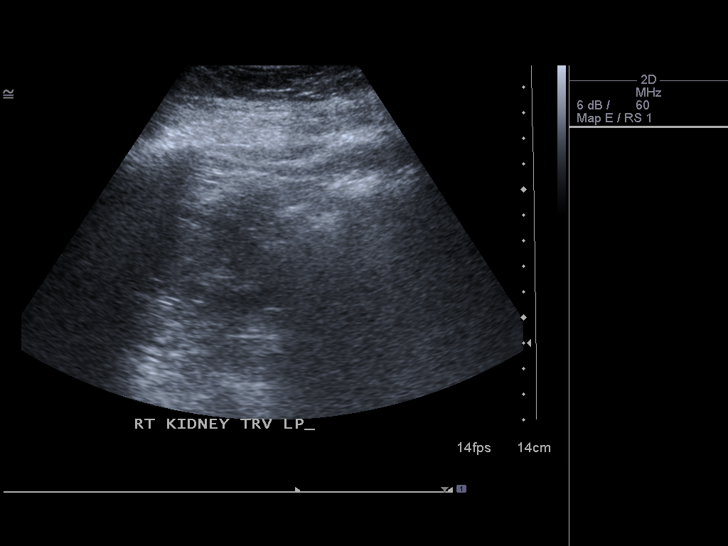
[im 14/23]
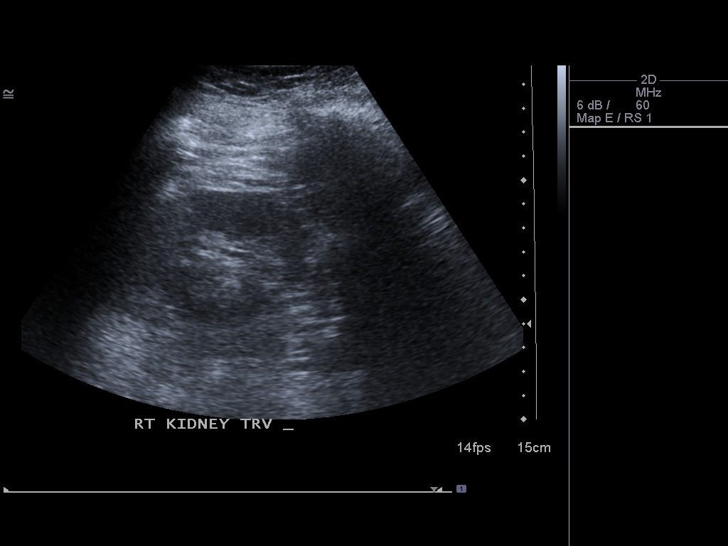
[im 16/23]
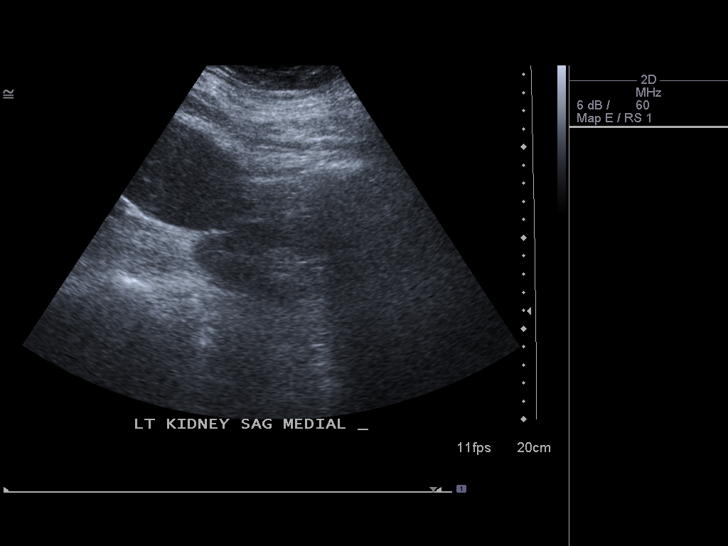
[im 18/23]
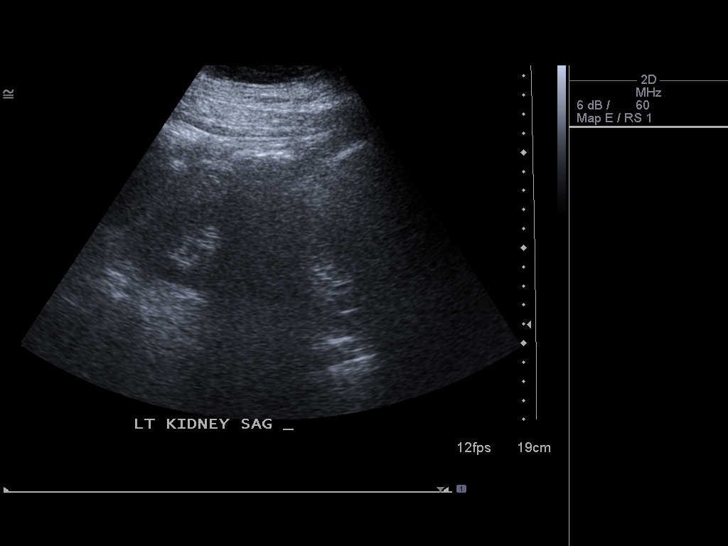
[im 19/23]
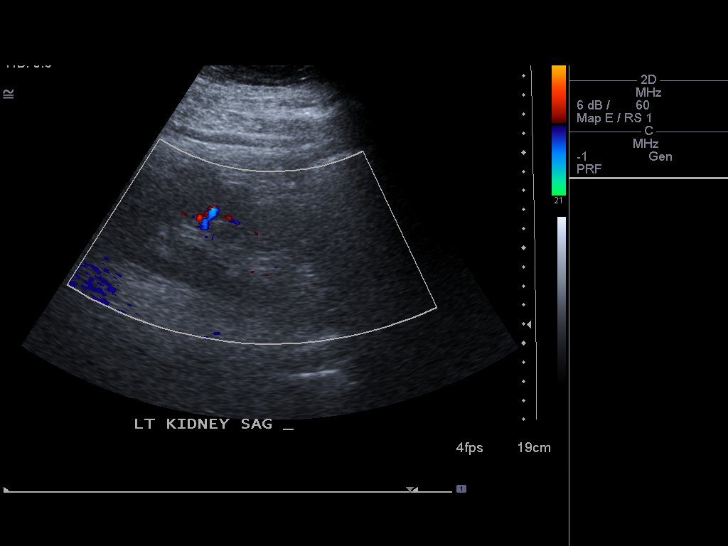
[im 21/23]
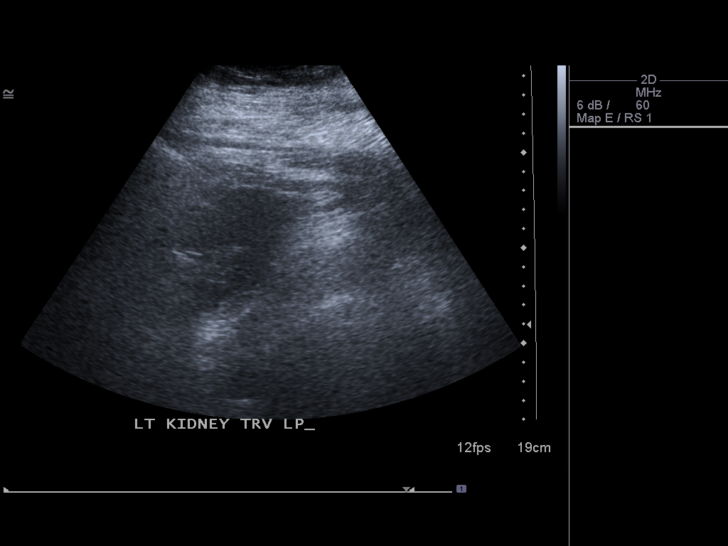
[im 23/23]
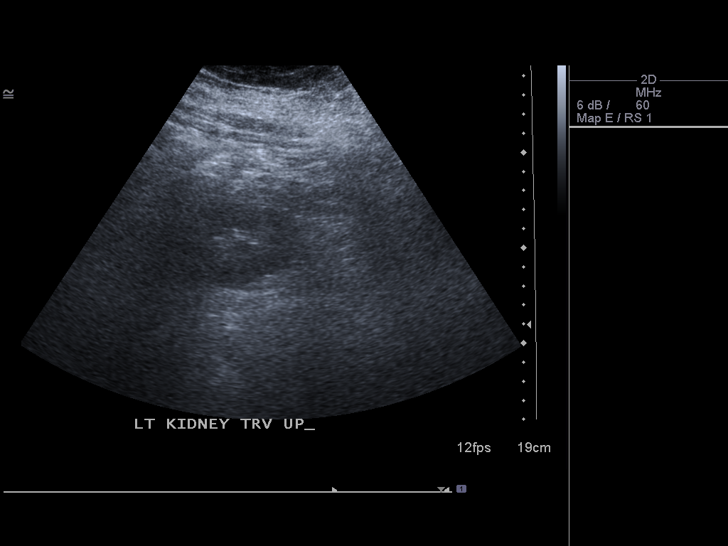

[14 of 23 positions shown; findings below may reference images not displayed]

FINDINGS: Right Kidney:  No hydronephrosis.  Well-preserved cortex.  No
shadowing calculi.  Normal size and parenchymal echotexture without
focal abnormalities.  Approximately 13.6 cm in length.

Left Kidney:  Less than optimal visualization due to gas in the
descending colon.  No hydronephrosis.  Well-preserved cortex.  No
shadowing calculi.  Normal size and parenchymal echotexture without
focal abnormalities.  Approximately 13.3 cm in length.

Bladder:  Normal in appearance.  Bilateral ureteral jets identified
at color Doppler evaluation.
IMPRESSION: Normal examination.

## 2016-01-02 IMAGING — US US ABDOMEN COMPLETE
1 series · 13 of 25 positions shown · non-contrast
Comparison: Ultrasound of the abdomen of 08/23/2012

CLINICAL DATA: Abdominal pain, history of cirrhosis

EXAM:
ULTRASOUND ABDOMEN COMPLETE

[Series 1: us abdomen complete · 0.28mm/px · 13 of 69 slices shown]
[im 1/69]
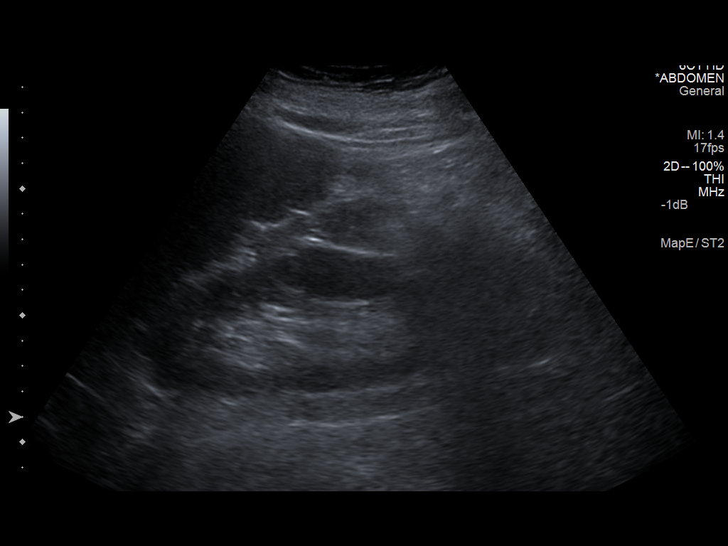
[im 6/69]
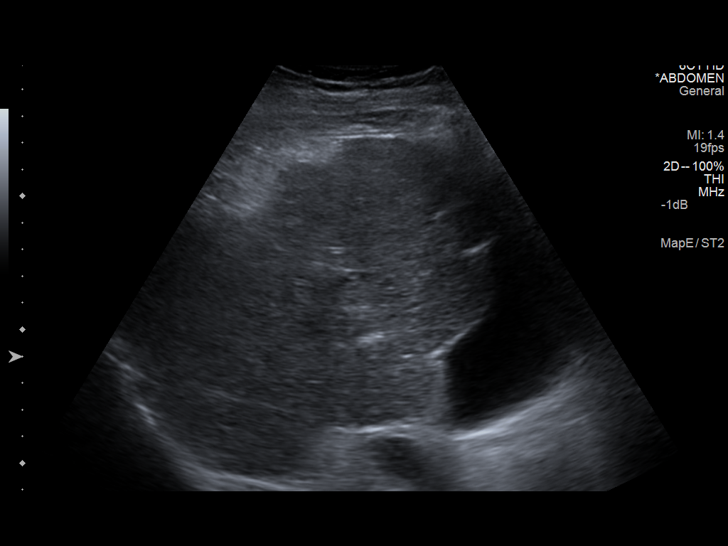
[im 12/69]
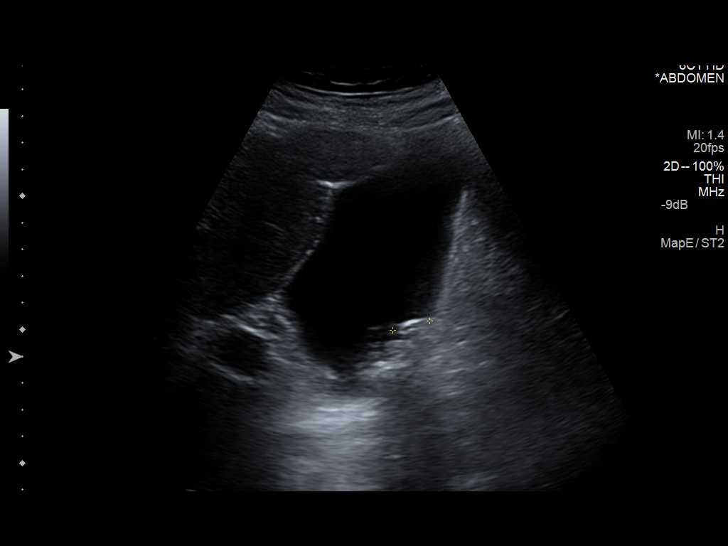
[im 18/69]
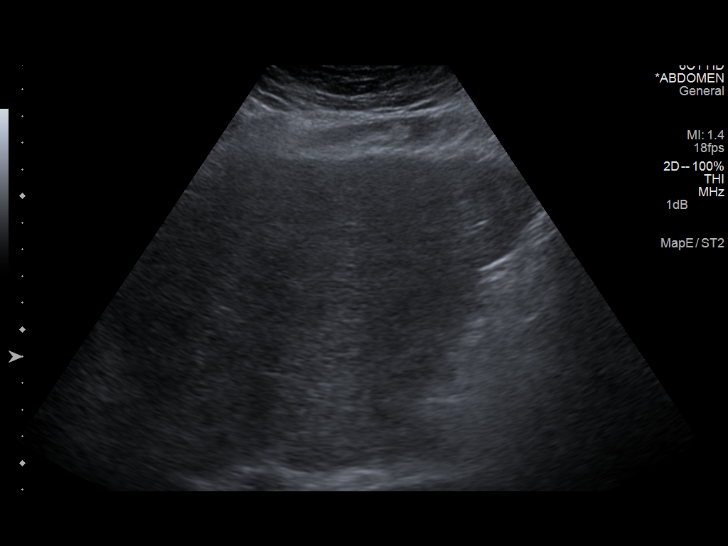
[im 23/69]
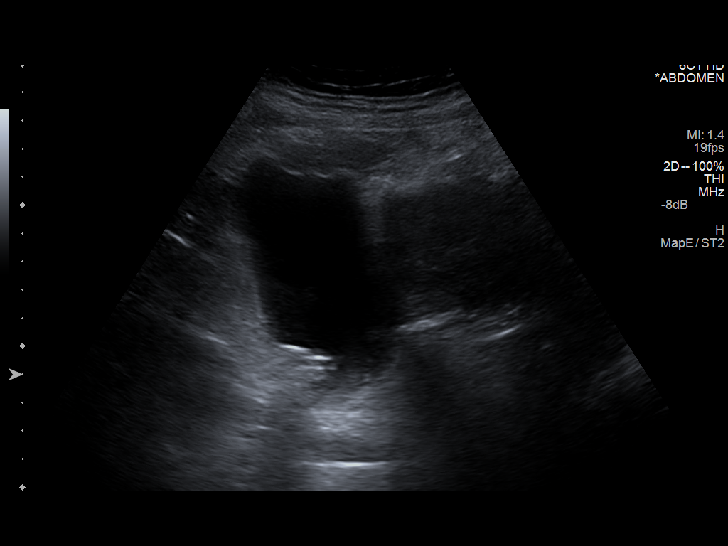
[im 29/69]
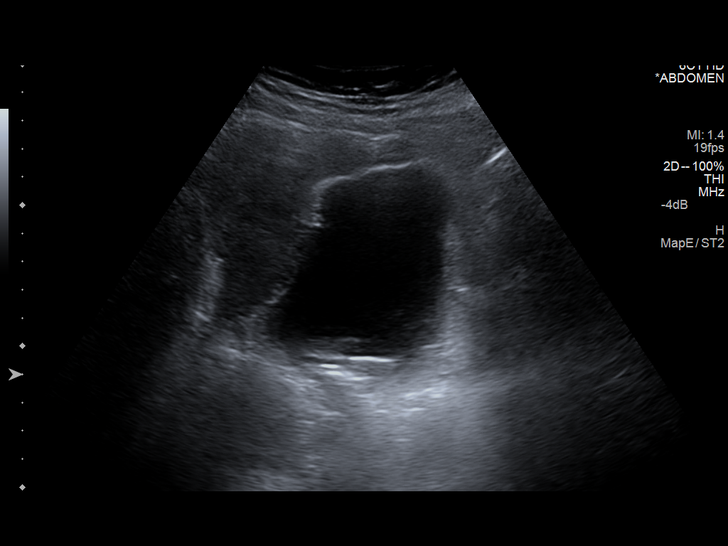
[im 35/69]
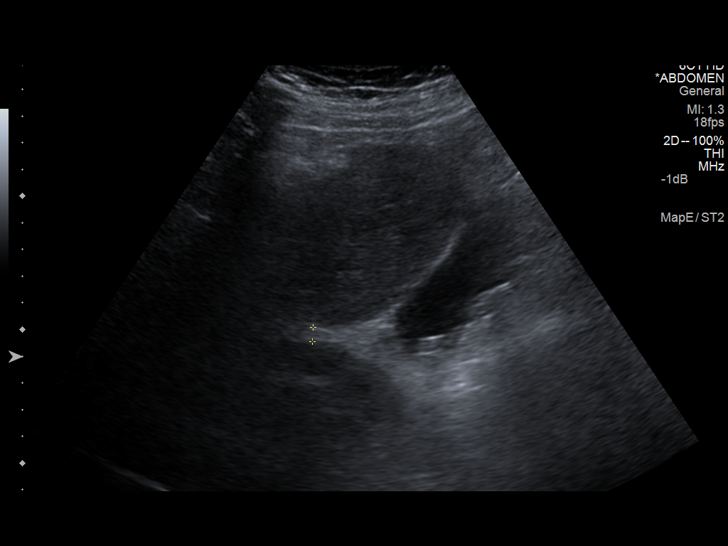
[im 40/69]
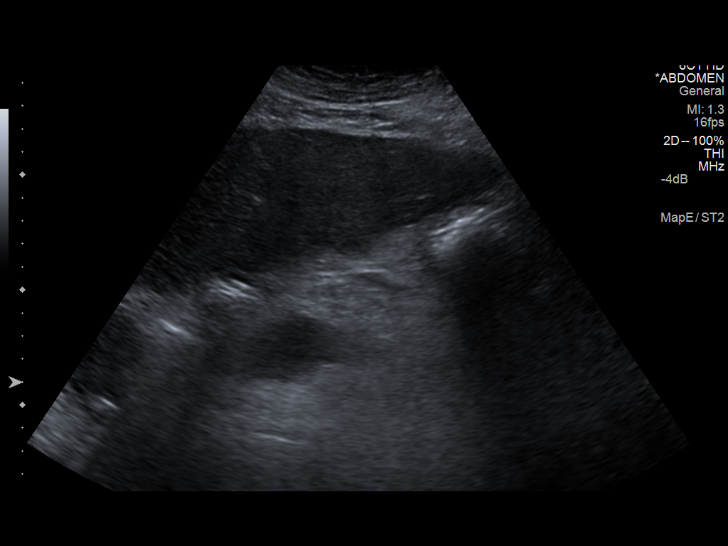
[im 46/69]
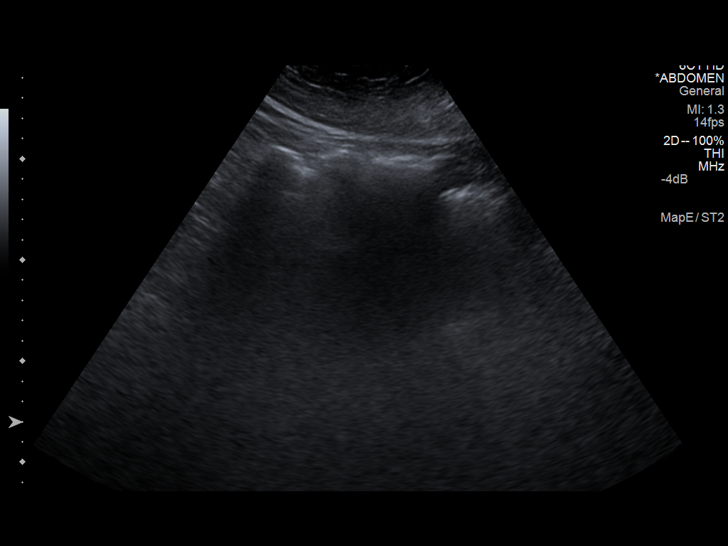
[im 52/69]
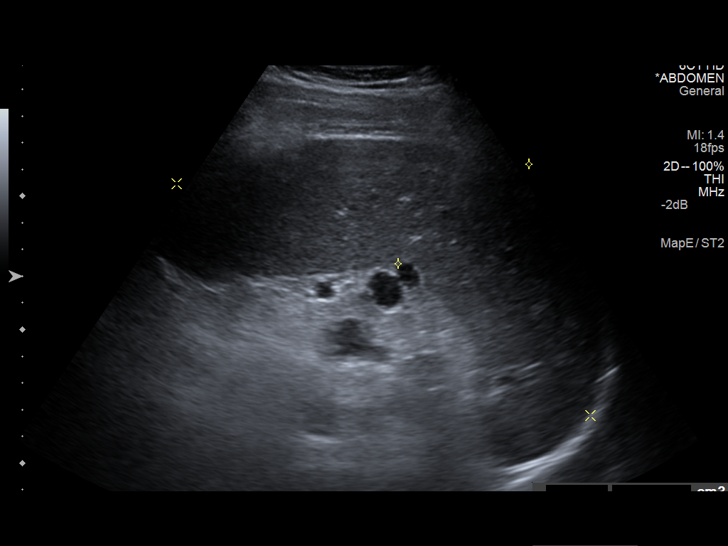
[im 57/69]
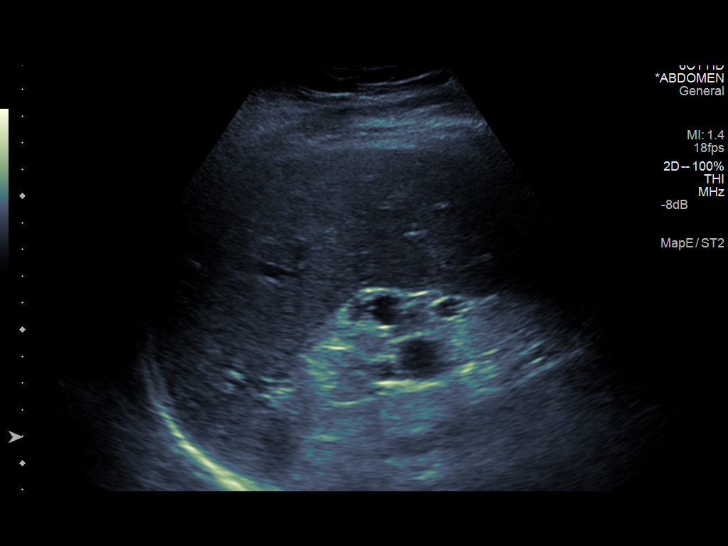
[im 63/69]
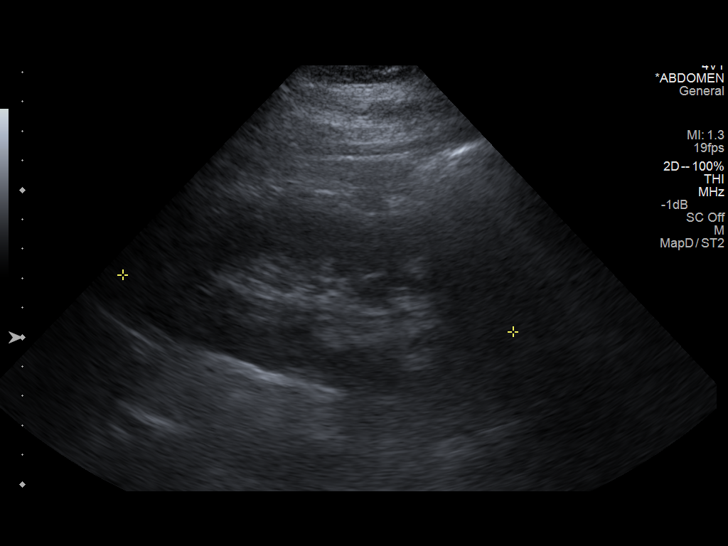
[im 69/69]
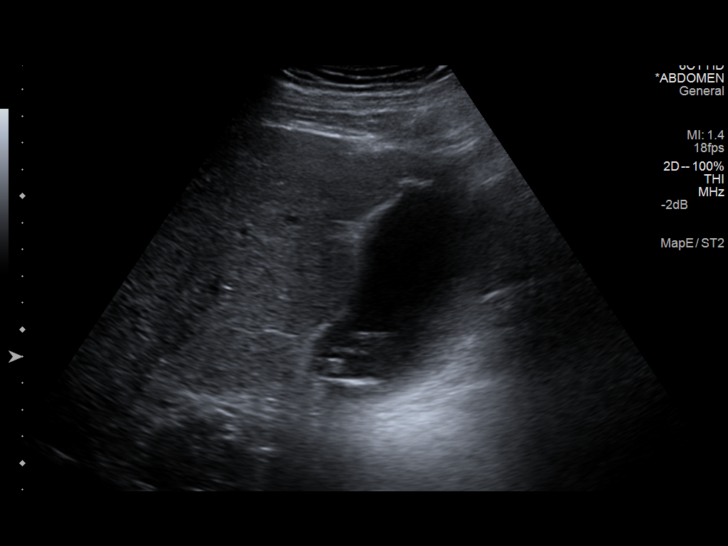

[13 of 25 positions shown; findings below may reference images not displayed]

FINDINGS: Gallbladder:

The gallbladder is visualized and there are several gallstones
present, the largest of 1.4 cm in diameter. No pain is present over
the gallbladder with compression.

Common bile duct:

Diameter: The common bile duct is partially obscured by bowel gas,
with the best measurement being 5.3 mm in diameter.

Liver:

The liver is inhomogeneous and nodular consistent with changes of
cirrhosis. No focal hepatic lesion is seen.

IVC:

Portions of the IVC are obscured by bowel gas.

Pancreas:

The tail of the pancreas is obscured by bowel gas.

Spleen:

The spleen is enlarged measuring 17.0 cm sagittally. The splenic
volume is 972 cubic cm. There is a 12 x 9 mm echogenic focus within
the spleen probably representing a benign process such as
hemangioma.

Right Kidney:

Length: 13.2 cm..  No hydronephrosis is seen.

Left Kidney:

Length: 13.4 cm.. The left kidney is slightly malrotated but no
hydronephrosis is seen.

Abdominal aorta:

The abdominal aorta is partially obscured by bowel gas.

Other findings:
IMPRESSION: 1. Inhomogeneous nodular liver consistent with changes of cirrhosis.
No focal hepatic lesion is seen.
2. Gallstones.
3. Splenomegaly.

## 2016-02-19 IMAGING — CR DG CHEST 1V PORT
1 series · 1 of 1 positions shown · non-contrast
Comparison: 08/22/2013

CLINICAL DATA: Respiratory failure

EXAM:
PORTABLE CHEST - 1 VIEW

[AP]
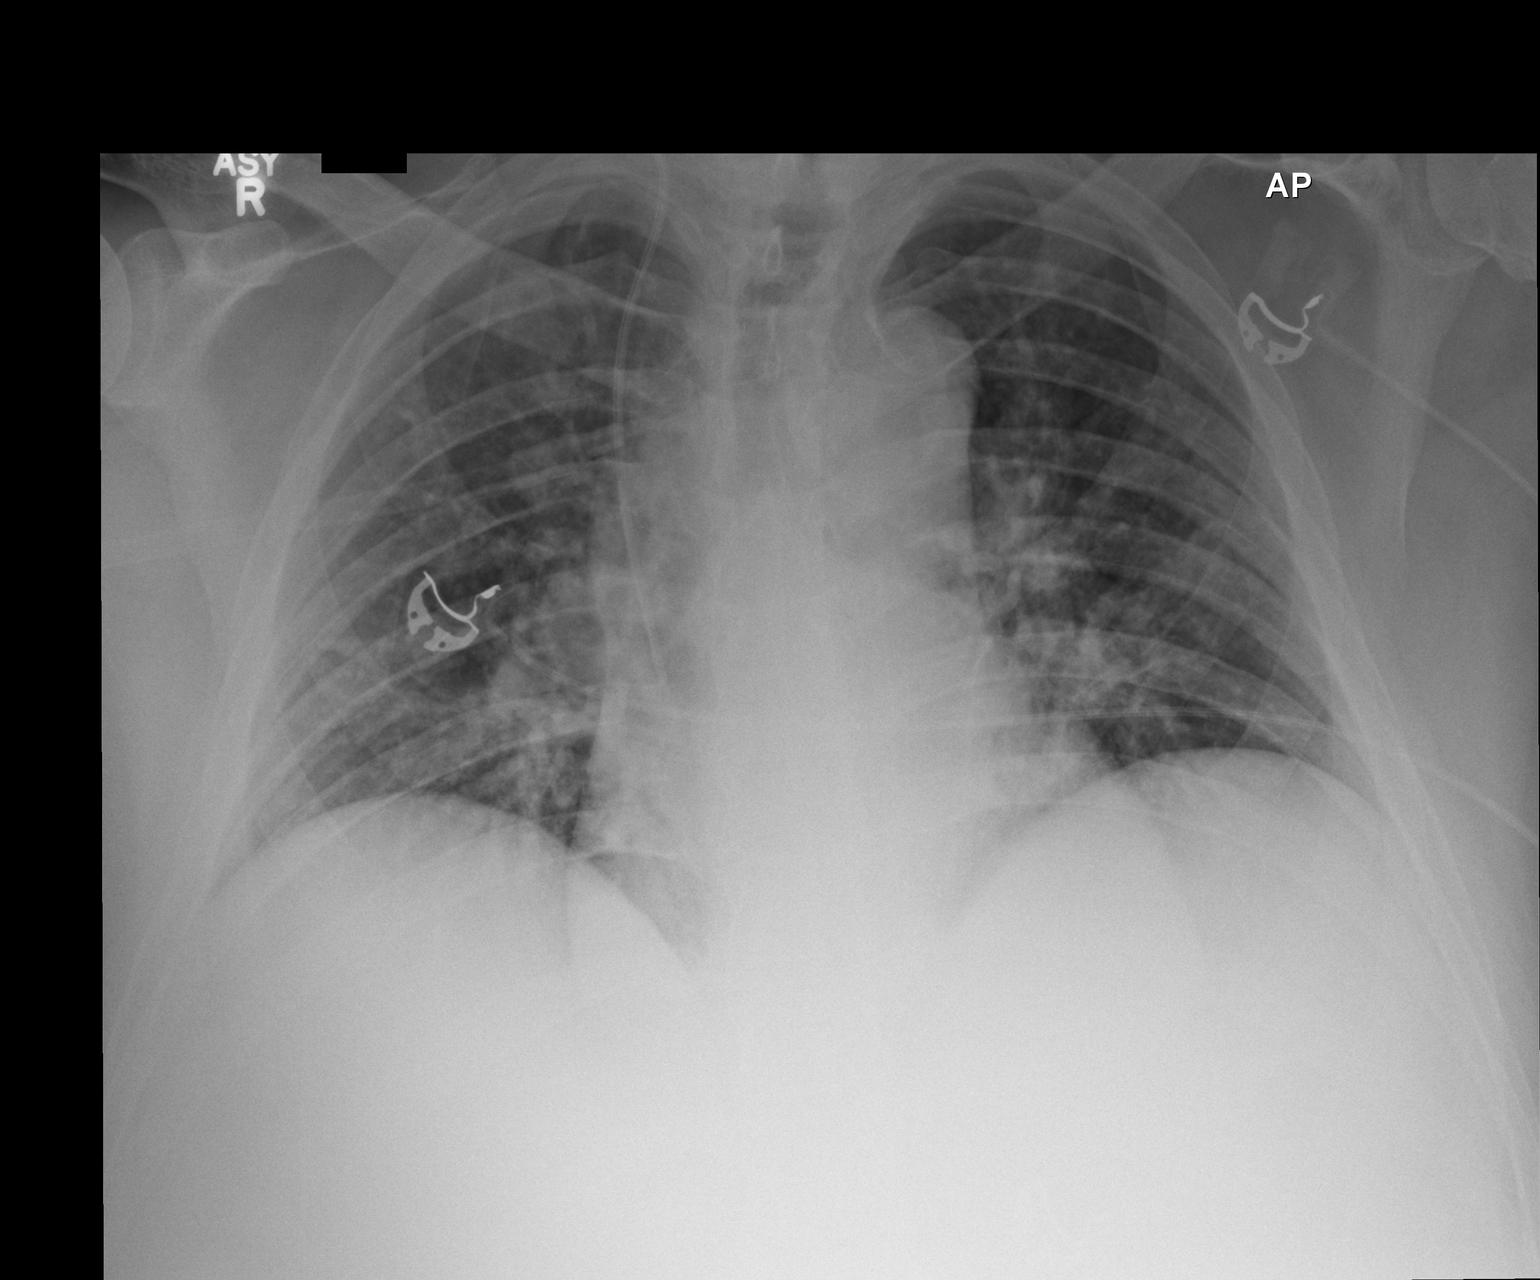

[1 of 1 positions shown; findings below may reference images not displayed]

FINDINGS: Endotracheal tube removed. Right jugular central venous catheter tip
unchanged at the cavoatrial junction. No pneumothorax.

Interval increase in mild bibasilar atelectasis. No edema or
effusion.
IMPRESSION: Endotracheal tube removed.  Increase in bibasilar atelectasis.
